# Patient Record
Sex: Female | Born: 1940 | Race: White | Hispanic: No | Marital: Married | Smoking: Never smoker
Health system: Southern US, Community
[De-identification: ages and names within clinical notes are randomized; demographics above are authoritative.]

## PROBLEM LIST (undated history)

## (undated) DIAGNOSIS — K589 Irritable bowel syndrome without diarrhea: Secondary | ICD-10-CM

## (undated) DIAGNOSIS — D649 Anemia, unspecified: Secondary | ICD-10-CM

## (undated) DIAGNOSIS — H409 Unspecified glaucoma: Secondary | ICD-10-CM

## (undated) DIAGNOSIS — M1990 Unspecified osteoarthritis, unspecified site: Secondary | ICD-10-CM

## (undated) DIAGNOSIS — E785 Hyperlipidemia, unspecified: Secondary | ICD-10-CM

## (undated) DIAGNOSIS — R112 Nausea with vomiting, unspecified: Secondary | ICD-10-CM

## (undated) DIAGNOSIS — R51 Headache: Secondary | ICD-10-CM

## (undated) DIAGNOSIS — I219 Acute myocardial infarction, unspecified: Secondary | ICD-10-CM

## (undated) DIAGNOSIS — K219 Gastro-esophageal reflux disease without esophagitis: Secondary | ICD-10-CM

## (undated) DIAGNOSIS — R269 Unspecified abnormalities of gait and mobility: Secondary | ICD-10-CM

## (undated) DIAGNOSIS — Z8701 Personal history of pneumonia (recurrent): Secondary | ICD-10-CM

## (undated) DIAGNOSIS — F419 Anxiety disorder, unspecified: Secondary | ICD-10-CM

## (undated) DIAGNOSIS — F32A Depression, unspecified: Secondary | ICD-10-CM

## (undated) DIAGNOSIS — I2510 Atherosclerotic heart disease of native coronary artery without angina pectoris: Secondary | ICD-10-CM

## (undated) DIAGNOSIS — R42 Dizziness and giddiness: Secondary | ICD-10-CM

## (undated) DIAGNOSIS — D72820 Lymphocytosis (symptomatic): Principal | ICD-10-CM

## (undated) DIAGNOSIS — F329 Major depressive disorder, single episode, unspecified: Secondary | ICD-10-CM

## (undated) DIAGNOSIS — I10 Essential (primary) hypertension: Secondary | ICD-10-CM

## (undated) DIAGNOSIS — Z9889 Other specified postprocedural states: Secondary | ICD-10-CM

## (undated) DIAGNOSIS — E039 Hypothyroidism, unspecified: Secondary | ICD-10-CM

## (undated) DIAGNOSIS — I209 Angina pectoris, unspecified: Secondary | ICD-10-CM

## (undated) DIAGNOSIS — Z8719 Personal history of other diseases of the digestive system: Secondary | ICD-10-CM

## (undated) DIAGNOSIS — R0600 Dyspnea, unspecified: Secondary | ICD-10-CM

## (undated) DIAGNOSIS — Z9181 History of falling: Secondary | ICD-10-CM

## (undated) DIAGNOSIS — E119 Type 2 diabetes mellitus without complications: Secondary | ICD-10-CM

## (undated) HISTORY — DX: Unspecified glaucoma: H40.9

## (undated) HISTORY — DX: Depression, unspecified: F32.A

## (undated) HISTORY — DX: Hyperlipidemia, unspecified: E78.5

## (undated) HISTORY — DX: Lymphocytosis (symptomatic): D72.820

## (undated) HISTORY — DX: Type 2 diabetes mellitus without complications: E11.9

## (undated) HISTORY — DX: Unspecified osteoarthritis, unspecified site: M19.90

## (undated) HISTORY — DX: Major depressive disorder, single episode, unspecified: F32.9

## (undated) HISTORY — PX: KNEE ARTHROSCOPY: SHX127

## (undated) HISTORY — DX: Irritable bowel syndrome, unspecified: K58.9

## (undated) HISTORY — DX: Gastro-esophageal reflux disease without esophagitis: K21.9

## (undated) HISTORY — DX: Hypothyroidism, unspecified: E03.9

## (undated) HISTORY — PX: CARPAL TUNNEL RELEASE: SHX101

---

## 1972-08-04 HISTORY — PX: VAGINAL HYSTERECTOMY: SUR661

## 1983-08-05 DIAGNOSIS — Z8701 Personal history of pneumonia (recurrent): Secondary | ICD-10-CM

## 1983-08-05 HISTORY — DX: Personal history of pneumonia (recurrent): Z87.01

## 1998-04-16 ENCOUNTER — Other Ambulatory Visit: Admission: RE | Admit: 1998-04-16 | Discharge: 1998-04-16 | Payer: Self-pay | Admitting: Gastroenterology

## 1999-03-29 ENCOUNTER — Other Ambulatory Visit: Admission: RE | Admit: 1999-03-29 | Discharge: 1999-03-29 | Payer: Self-pay | Admitting: Internal Medicine

## 2002-10-03 ENCOUNTER — Encounter: Payer: Self-pay | Admitting: Internal Medicine

## 2002-10-03 ENCOUNTER — Encounter: Admission: RE | Admit: 2002-10-03 | Discharge: 2002-10-03 | Payer: Self-pay | Admitting: Internal Medicine

## 2003-05-05 ENCOUNTER — Ambulatory Visit (HOSPITAL_COMMUNITY): Admission: RE | Admit: 2003-05-05 | Discharge: 2003-05-05 | Payer: Self-pay | Admitting: General Surgery

## 2003-05-05 ENCOUNTER — Encounter: Payer: Self-pay | Admitting: General Surgery

## 2003-07-06 ENCOUNTER — Inpatient Hospital Stay (HOSPITAL_COMMUNITY): Admission: RE | Admit: 2003-07-06 | Discharge: 2003-07-07 | Payer: Self-pay | Admitting: Obstetrics and Gynecology

## 2003-08-05 HISTORY — PX: BLADDER SUSPENSION: SHX72

## 2005-02-25 ENCOUNTER — Encounter (INDEPENDENT_AMBULATORY_CARE_PROVIDER_SITE_OTHER): Payer: Self-pay | Admitting: *Deleted

## 2005-02-25 ENCOUNTER — Ambulatory Visit (HOSPITAL_COMMUNITY): Admission: RE | Admit: 2005-02-25 | Discharge: 2005-02-25 | Payer: Self-pay | Admitting: Gastroenterology

## 2005-03-18 ENCOUNTER — Encounter: Admission: RE | Admit: 2005-03-18 | Discharge: 2005-03-18 | Payer: Self-pay | Admitting: Gastroenterology

## 2007-08-05 HISTORY — PX: HAMMER TOE SURGERY: SHX385

## 2007-12-30 ENCOUNTER — Ambulatory Visit: Payer: Self-pay | Admitting: Gastroenterology

## 2008-08-04 HISTORY — PX: THYROIDECTOMY: SHX17

## 2008-09-27 ENCOUNTER — Other Ambulatory Visit: Admission: RE | Admit: 2008-09-27 | Discharge: 2008-09-27 | Payer: Self-pay | Admitting: Interventional Radiology

## 2008-09-27 ENCOUNTER — Encounter (INDEPENDENT_AMBULATORY_CARE_PROVIDER_SITE_OTHER): Payer: Self-pay | Admitting: Interventional Radiology

## 2008-09-27 ENCOUNTER — Encounter: Admission: RE | Admit: 2008-09-27 | Discharge: 2008-09-27 | Payer: Self-pay | Admitting: Internal Medicine

## 2009-04-03 ENCOUNTER — Encounter: Admission: RE | Admit: 2009-04-03 | Discharge: 2009-04-03 | Payer: Self-pay | Admitting: Internal Medicine

## 2009-04-03 ENCOUNTER — Encounter (INDEPENDENT_AMBULATORY_CARE_PROVIDER_SITE_OTHER): Payer: Self-pay | Admitting: Interventional Radiology

## 2009-04-03 ENCOUNTER — Other Ambulatory Visit: Admission: RE | Admit: 2009-04-03 | Discharge: 2009-04-03 | Payer: Self-pay | Admitting: Interventional Radiology

## 2009-04-27 ENCOUNTER — Encounter: Admission: RE | Admit: 2009-04-27 | Discharge: 2009-04-27 | Payer: Self-pay | Admitting: Endocrinology

## 2009-07-05 ENCOUNTER — Encounter (INDEPENDENT_AMBULATORY_CARE_PROVIDER_SITE_OTHER): Payer: Self-pay | Admitting: Surgery

## 2009-07-05 ENCOUNTER — Ambulatory Visit (HOSPITAL_COMMUNITY): Admission: RE | Admit: 2009-07-05 | Discharge: 2009-07-06 | Payer: Self-pay | Admitting: Surgery

## 2010-02-08 ENCOUNTER — Telehealth: Payer: Self-pay | Admitting: Gastroenterology

## 2010-09-03 NOTE — Progress Notes (Signed)
Summary: Switch back to Dr. Jarold Motto  Phone Note From Other Clinic   Caller: [place] @ Dr. Jacky Kindle  [phone] Call For: Dr. Jarold Motto Summary of Call: pt. is a former Dr. Jarold Motto pt. and saw G.I. Dr. Lars Pinks in [place] last year for COL. Dr. Lars Pinks moved to [place], [place] which is too far of a distance to drive and would like to see Dr. Jarold Motto again. Would Dr. Jarold Motto except? Will be sending records. Initial call taken by: Karna Christmas,  February 08, 2010 2:53 PM  Follow-up for Phone Call        NEED CHART... Follow-up by: Mardella Layman MD Sherlyn Lick 3:49 PM  Additional Follow-up for Phone Call Additional follow up Details #1::        Chart ordered.   Lupita Leash Surface RN  February 11, 2010 3:27 PM  Chart to Dr. Jarold Motto for review. Additional Follow-up by: Ashok Cordia RN,  February 18, 2010 11:19 AM     Appended Document: Switch back to Dr. Jarold Motto Dr. Jarold Motto reviewed pt's chart.  Ok to sch appt.  Appended Document: Switch back to Dr. Rebekah Chesterfield pt. to inform her of Dr. Norval Gable decision and to sch'd an appt. Pt. has already sch'd an appt. w/another G.I. Dr. in [place].

## 2010-11-05 LAB — GLUCOSE, CAPILLARY
Glucose-Capillary: 142 mg/dL — ABNORMAL HIGH (ref 70–99)
Glucose-Capillary: 150 mg/dL — ABNORMAL HIGH (ref 70–99)
Glucose-Capillary: 156 mg/dL — ABNORMAL HIGH (ref 70–99)
Glucose-Capillary: 172 mg/dL — ABNORMAL HIGH (ref 70–99)

## 2010-11-05 LAB — CALCIUM: Calcium: 9.4 mg/dL (ref 8.4–10.5)

## 2010-11-06 LAB — DIFFERENTIAL
Eosinophils Relative: 1 % (ref 0–5)
Lymphocytes Relative: 50 % — ABNORMAL HIGH (ref 12–46)
Lymphs Abs: 3.8 10*3/uL (ref 0.7–4.0)
Monocytes Absolute: 0.6 10*3/uL (ref 0.1–1.0)
Neutro Abs: 3 10*3/uL (ref 1.7–7.7)

## 2010-11-06 LAB — URINALYSIS, ROUTINE W REFLEX MICROSCOPIC
Glucose, UA: NEGATIVE mg/dL
Nitrite: NEGATIVE
Protein, ur: NEGATIVE mg/dL

## 2010-11-06 LAB — CBC
MCHC: 35 g/dL (ref 30.0–36.0)
MCV: 91.2 fL (ref 78.0–100.0)
Platelets: 322 10*3/uL (ref 150–400)

## 2010-11-06 LAB — COMPREHENSIVE METABOLIC PANEL
AST: 23 U/L (ref 0–37)
Albumin: 4.8 g/dL (ref 3.5–5.2)
Calcium: 10.1 mg/dL (ref 8.4–10.5)
Creatinine, Ser: 0.81 mg/dL (ref 0.4–1.2)
GFR calc Af Amer: >60 mL/min (ref >60)
GFR calc non Af Amer: >60 mL/min (ref >60)

## 2010-11-06 LAB — PROTIME-INR: Prothrombin Time: 13.3 seconds (ref 11.6–15.2)

## 2010-11-06 LAB — APTT: aPTT: 29 seconds (ref 24–37)

## 2010-12-20 NOTE — H&P (Signed)
NAME:  Angela Barrett, Angela Barrett                             ACCOUNT NO.:  [id number]   MEDICAL RECORD NO.:  [id number]                   PATIENT TYPE:   LOCATION:                                       FACILITY:   PHYSICIAN:  Guy Sandifer. Arleta Creek, M.D.           DATE OF BIRTH:  07/06/2003   DATE OF ADMISSION:  DATE OF DISCHARGE:                                HISTORY & PHYSICAL   CHIEF COMPLAINT:  Cystocele.   HISTORY OF PRESENT ILLNESS:  This patient is a 70 year old married Angela Barrett  female, G4, P4, status post hysterectomy in 1970, status post  anterior/posterior vaginal repair with mid-urethral sling approximately  eight years ago.  Complains of recurrent discomfort.  She can feel the  bladder bulging at the opening of the vagina.  She also has discomfort at  times with intercourse.  She also has difficulty getting her bladder to  empty completely and has two or three trips a night to the bathroom to  urinate.   Reevaluation with Angela Barrett reveals a normal cystometrogram  and a normal flow study.  He does not feel that she requires any incontinent  procedures at present.  Anterior repair with Pelvachol graft as well as  possible posterior repair with Pelvasoft has been discussed with the  patient.  Potential risk of complications were reviewed preoperatively.   PAST MEDICAL HISTORY:  1. Diabetes, followed by Angela Barrett.  2. Irritable bowel syndrome.  3. Diverticulosis.  4. Gastroesophageal reflux disease.  5. Frequent diarrhea secondary to irritable bowel syndrome.  6. Recent evaluation by Angela Barrett. Angela Barrett revealed adequate sphincter     tone.  He felt her rectal leakage was secondary to diarrhea.  7. Bilateral superficial varicosities.  8. 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  9. Arthritis.   PAST SURGICAL HISTORY:  1. Hysterectomy in 1972.  2. A&P repair with midurethral sling eight years ago.   OBSTETRIC HISTORY:  Vaginal delivery x 4.   FAMILY HISTORY:  Bone  cancer in mother.  Emphysema in brother and father.   MEDICATIONS:  1. Glucophage XR 500 mg q.i.d.  2. Glucotrol XL 10 mg q.d.  3. Protonix 10 mg q.d.  4. Glucosamine q.d.  5. Ativan 2 mg q.h.s.  6. Gonadial 1 mg q.d.  7. Lantus insulin 20 units q.h.s.  8. Levbid 0.375 mg 1/2 tablet q.d. p.r.n.   ALLERGIES:  ASPIRIN leading to stomach pain.  MACROBID swelling and itching.  MOTRIN fluid retention and swelling.  AMITRIPTYLINE with disorientation.  CORTISONE with elevated serum glucose.   REVIEW OF SYMPTOMS:  NEUROLOGIC:  Denies recent headache.  PULMONARY:  Denies shortness of breath.  GASTROINTESTINAL:  Diarrhea as above with  recent evaluation by Angela Barrett consistent with diarrhea.  GENITOURINARY:  Recent evaluation by Angela Barrett as above.   PHYSICAL EXAMINATION:  VITAL SIGNS:  Height 5 feet 3-1/2 inches.  Weight 160  pounds.  Blood pressure 142/74.  NECK:  Without thyromegaly.  LUNGS:  Clear to auscultation.  HEART:  Regular rate and rhythm.  BACK:  Without CVA tenderness.  BREASTS:  Without mass, traction, or discharge.  ABDOMEN:  Soft and nontender without masses.  PELVIC:  Vagina without lesions.  Cystocele is noted at the level of  the  vaginal introitus.  There is fair urethrovesical angle support.  No obvious  enterocele or rectocele.  Rectovaginal septum feels intact on rectovaginal  examination.  EXTREMITIES:  Grossly within normal limits.  NEUROLOGICAL:  Grossly within normal limits.   ASSESSMENT:  Recurrent cystocele.   PLAN:  Anterior repair with a Pelvachol graft, possible posterior repair  with Pelvasoft.                                               Guy Sandifer Arleta Creek, M.D.    JET/MEDQ  D:  07/03/2003  T:  07/03/2003  Job:  [id number]

## 2010-12-20 NOTE — Op Note (Signed)
NAME:  Angela Barrett, Angela Barrett                 ACCOUNT NO.:  [id number]   MEDICAL RECORD NO.:  [id number]          PATIENT TYPE:  AMB   LOCATION:  ENDO                         FACILITY:  [facility]   PHYSICIAN:  Petra Kuba, M.D.    DATE OF BIRTH:  1940/12/07   DATE OF PROCEDURE:  02/25/2005  DATE OF DISCHARGE:                                 OPERATIVE REPORT   PROCEDURE PERFORMED:  Esophagogastroduodenoscopy with biopsy, Savary  dilatation.   ENDOSCOPIST:  Petra Kuba, M.D.   INDICATIONS FOR PROCEDURE:  Dysphagia and abdominal pain.   Consent was signed prior to any premeds given, after the risks, benefits,  methods and options were thoroughly discussed in the office.   MEDICINES USED:  Demerol 100 mg, Versed 7 mg.   DESCRIPTION OF PROCEDURE:  The video endoscope was inserted by direct  vision.  The esophagus was normal.  In the distal esophagus was a small  hiatal hernia with a very thin, very widely patent fibrous ring just above  it.  The scope passed easily through this to the stomach and advanced to the  antrum, pertinent for a moderate amount of antritis and advanced through a  widely patent pylorus into a normal duodenal bulb and around the C-loop to a  normal second portion of the duodenum with minimal inflammation.  We went  ahead and took a few biopsies of the second portion of the duodenum and  slowly withdrew back to the bulb which again confirmed its normal  appearance.  The scope withdrawn back to the stomach and retroflexed.  The  angularis, cardia, fundus, lesser and greater curve were all normal except  for some minimal gastritis.  Straight visualization of the stomach did not  reveal any additional findings.  We went ahead and slowly withdrew back to  about 18 cm.  Again confirming the normal esophagus.  Scope was then  advanced back into the stomach.  Both the stomach and duodenum were re-  evaluated completely without additional findings.  We went ahead and took a  few  biopsies of the antrum and a few of the proximal stomach to rule out  Helicobacter pylori.  We then advanced the Savary wire until the customary J-  loop was seen in the antrum and without fluoroscopy guidance.  While  withdrawing the scope, we advanced the wire.  Once the scope was removed,  the wire was at the customary four bar mark which corresponded to 80 cm and  we went ahead and proceeded with a 16 mm Savary dilation over the wire  without fluoroscopy in the customary fashion.  Once the 16 was inserted, the  wire was withdrawn back into the dilator.  Both were removed in tandem.  There was no resistance of heme on the dilator.  The procedure was  terminated at its juncture.  The patient tolerated the procedure well.  There were no obvious immediate complication.   ENDOSCOPIC DIAGNOSES:  1.  Small hiatal hernia with a questionably thin, widely patent fibrous      ring.  2.  Moderate antritis, clinical  gastritis, status post biopsy.  3.  Minimal duodenitis, status post biopsy.  4.  Otherwise normal esophagogastroduodenoscopy.   THERAPY:  Savary dilatation without fluoroscopy, 16 mm only.   PLAN:  See how the dilation works.  If not, continue with the thyroid work-  up per Geoffry Paradise, M.D.  Probably if abdominal pain continues, get an  ultrasound next.  Will check on her when we receive the biopsies.       MEM/MEDQ  D:  02/25/2005  T:  02/25/2005  Job:  [id number]   cc:   Geoffry Paradise, M.D.  [address]  [place]  [place] [address]  Fax: [phone]

## 2010-12-20 NOTE — Op Note (Signed)
NAME:  Angela Barrett, Angela Barrett                           ACCOUNT NO.:  [id number]   MEDICAL RECORD NO.:  [id number]                   PATIENT TYPE:  AMB   LOCATION:  DAY                                  FACILITY:  [facility]   PHYSICIAN:  Guy Sandifer. Arleta Creek, M.D.           DATE OF BIRTH:  [birth year]   DATE OF PROCEDURE:  07/06/2003  DATE OF DISCHARGE:                                 OPERATIVE REPORT   PREOPERATIVE DIAGNOSIS:  Cystocele.   POSTOPERATIVE DIAGNOSIS:  Cystocele.   PROCEDURE:  Anterior repair with Pelvicol graft.   SURGEON:  Guy Sandifer. Henderson Cloud, M.D.   ASSISTANT:  Raynald Kemp, M.D.   ANESTHESIA:  General with endotracheal intubated.   ESTIMATED BLOOD LOSS:  Less than 50 mL.   SPECIMENS:  None.   INDICATIONS FOR PROCEDURE:  The patient is a 70 year old, married [place]  female, G4, P4, status post hysterectomy, status post previous anterior  posterior vaginal repair with a mid urethral sling who has recurrent  symptoms of cystocele. Details are dictated in the history and physical.  Anterior repair and possible posterior repair has been discussed with the  patient. Pelvicol grafts have been discussed. Potential risks and  complications have been reviewed including but not limited to infection,  bowel, bladder, ureteral damage, bleeding requiring transfusion of blood  products with possible transfusion reaction, HIV and hepatitis acquisition,  DVT, PE, pneumonia and fistula formation, postoperative dyspareunia and  recurrent cystocele and/or rectocele. Consent is signed on the chart.   DESCRIPTION OF PROCEDURE:  The patient is taken to the operating room,  placed in dorsal supine position where general anesthesia is induced via  endotracheal intubation. She was then placed in the dorsal lithotomy  position where she is prepped, bladder straight catheterized and she is  draped in a sterile fashion. Examination reveals the cystocele to be  present. However, there is good support  posteriorly. The vaginal cuff was  grasped at each apex with Allis clamps and grasped in the midline. The  mucosa is infiltrated with 0.5% lidocaine with 1:200,000 epinephrine. A T  shaped incision is then made across the vaginal cuff and extending up the  anterior vaginal mucosa in the midline. This is carried up to a point  approximately 3 cm below the level of the urethral meatus. This dissection  is carried out widely, sharply and bluntly freeing up the cystocele.  Dissection is stopped just before the level of the urethrovesical angle. The  patient continues to have good support there status post her mid urethral  sling. The cystocele is reduced first with a pursestring and then  figure-of-  eight sutures of #0 Monocryl. A Pelvicol graft is then trimmed to fit and is  placed under the bladder and anchored in place to the pelvic sidewalls a #0  Monocryl suture and also tacked in the midline with #0 Monocryl suture.  Excess vaginal mucosa is removed. The  apical portion of the mucosa is closed  with a single figure-of-eight #0 Monocryl. The remainder of the mucosa is  closed  with a running locking stitch of 2-0 chromic. Once inch iodoform packing is  placed and a Foley catheter is placed and clear urine is noted. All counts  correct. The patient is awakened and taken to the recovery room in stable  condition.                                               Guy Sandifer Arleta Creek, M.D.    JET/MEDQ  D:  07/06/2003  T:  07/06/2003  Job:  [id number]

## 2011-04-21 ENCOUNTER — Ambulatory Visit: Payer: Self-pay | Admitting: Gastroenterology

## 2011-08-27 DIAGNOSIS — E119 Type 2 diabetes mellitus without complications: Secondary | ICD-10-CM | POA: Diagnosis not present

## 2011-08-27 DIAGNOSIS — E042 Nontoxic multinodular goiter: Secondary | ICD-10-CM | POA: Diagnosis not present

## 2011-08-27 DIAGNOSIS — E890 Postprocedural hypothyroidism: Secondary | ICD-10-CM | POA: Diagnosis not present

## 2011-08-27 DIAGNOSIS — F411 Generalized anxiety disorder: Secondary | ICD-10-CM | POA: Diagnosis not present

## 2011-10-16 DIAGNOSIS — H16229 Keratoconjunctivitis sicca, not specified as Sjogren's, unspecified eye: Secondary | ICD-10-CM | POA: Diagnosis not present

## 2011-10-16 DIAGNOSIS — H16149 Punctate keratitis, unspecified eye: Secondary | ICD-10-CM | POA: Diagnosis not present

## 2011-10-16 DIAGNOSIS — H2510 Age-related nuclear cataract, unspecified eye: Secondary | ICD-10-CM | POA: Diagnosis not present

## 2011-10-16 DIAGNOSIS — S058X9A Other injuries of unspecified eye and orbit, initial encounter: Secondary | ICD-10-CM | POA: Diagnosis not present

## 2011-10-16 DIAGNOSIS — H5710 Ocular pain, unspecified eye: Secondary | ICD-10-CM | POA: Diagnosis not present

## 2011-10-23 DIAGNOSIS — H5789 Other specified disorders of eye and adnexa: Secondary | ICD-10-CM | POA: Diagnosis not present

## 2011-10-23 DIAGNOSIS — H16149 Punctate keratitis, unspecified eye: Secondary | ICD-10-CM | POA: Diagnosis not present

## 2011-10-23 DIAGNOSIS — H40039 Anatomical narrow angle, unspecified eye: Secondary | ICD-10-CM | POA: Diagnosis not present

## 2011-10-23 DIAGNOSIS — H16229 Keratoconjunctivitis sicca, not specified as Sjogren's, unspecified eye: Secondary | ICD-10-CM | POA: Diagnosis not present

## 2011-11-07 DIAGNOSIS — H16229 Keratoconjunctivitis sicca, not specified as Sjogren's, unspecified eye: Secondary | ICD-10-CM | POA: Diagnosis not present

## 2011-11-07 DIAGNOSIS — H16149 Punctate keratitis, unspecified eye: Secondary | ICD-10-CM | POA: Diagnosis not present

## 2012-02-19 DIAGNOSIS — H40039 Anatomical narrow angle, unspecified eye: Secondary | ICD-10-CM | POA: Diagnosis not present

## 2012-02-19 DIAGNOSIS — H04129 Dry eye syndrome of unspecified lacrimal gland: Secondary | ICD-10-CM | POA: Diagnosis not present

## 2012-02-25 DIAGNOSIS — E890 Postprocedural hypothyroidism: Secondary | ICD-10-CM | POA: Diagnosis not present

## 2012-02-25 DIAGNOSIS — E119 Type 2 diabetes mellitus without complications: Secondary | ICD-10-CM | POA: Diagnosis not present

## 2012-02-25 DIAGNOSIS — R82998 Other abnormal findings in urine: Secondary | ICD-10-CM | POA: Diagnosis not present

## 2012-03-03 DIAGNOSIS — E119 Type 2 diabetes mellitus without complications: Secondary | ICD-10-CM | POA: Diagnosis not present

## 2012-03-03 DIAGNOSIS — E890 Postprocedural hypothyroidism: Secondary | ICD-10-CM | POA: Diagnosis not present

## 2012-03-03 DIAGNOSIS — F329 Major depressive disorder, single episode, unspecified: Secondary | ICD-10-CM | POA: Diagnosis not present

## 2012-03-03 DIAGNOSIS — Z79899 Other long term (current) drug therapy: Secondary | ICD-10-CM | POA: Diagnosis not present

## 2012-03-03 DIAGNOSIS — Z0000 Encounter for general adult medical examination without abnormal findings: Secondary | ICD-10-CM | POA: Diagnosis not present

## 2012-03-04 DIAGNOSIS — Z1212 Encounter for screening for malignant neoplasm of rectum: Secondary | ICD-10-CM | POA: Diagnosis not present

## 2012-03-23 ENCOUNTER — Telehealth: Payer: Self-pay | Admitting: Oncology

## 2012-03-23 NOTE — Telephone Encounter (Signed)
Referring Dr. Geoffry Paradise Dx- ? [facility] NP packet mailed out.  Chart delivered.

## 2012-03-23 NOTE — Telephone Encounter (Signed)
S/W pt re appt 8/22 @ 3 w/Dr. Gaylyn Rong.

## 2012-03-24 ENCOUNTER — Encounter: Payer: Self-pay | Admitting: Oncology

## 2012-03-24 DIAGNOSIS — D72820 Lymphocytosis (symptomatic): Secondary | ICD-10-CM

## 2012-03-24 HISTORY — DX: Lymphocytosis (symptomatic): D72.820

## 2012-03-25 ENCOUNTER — Ambulatory Visit: Payer: Self-pay | Admitting: Oncology

## 2012-03-25 ENCOUNTER — Other Ambulatory Visit: Payer: Self-pay | Admitting: Lab

## 2012-03-25 ENCOUNTER — Ambulatory Visit: Payer: Self-pay

## 2012-03-25 DIAGNOSIS — E890 Postprocedural hypothyroidism: Secondary | ICD-10-CM | POA: Diagnosis not present

## 2012-04-11 NOTE — Progress Notes (Signed)
No show.  Reschdule prn.

## 2012-04-13 ENCOUNTER — Telehealth: Payer: Self-pay | Admitting: Oncology

## 2012-04-13 NOTE — Telephone Encounter (Signed)
Per pt she stated that Dr. Jacky Kindle will f/u with pt.

## 2012-04-22 DIAGNOSIS — Z1231 Encounter for screening mammogram for malignant neoplasm of breast: Secondary | ICD-10-CM | POA: Diagnosis not present

## 2012-05-20 DIAGNOSIS — H179 Unspecified corneal scar and opacity: Secondary | ICD-10-CM | POA: Diagnosis not present

## 2012-05-20 DIAGNOSIS — H40039 Anatomical narrow angle, unspecified eye: Secondary | ICD-10-CM | POA: Diagnosis not present

## 2012-05-20 DIAGNOSIS — E119 Type 2 diabetes mellitus without complications: Secondary | ICD-10-CM | POA: Diagnosis not present

## 2012-05-20 DIAGNOSIS — H2510 Age-related nuclear cataract, unspecified eye: Secondary | ICD-10-CM | POA: Diagnosis not present

## 2012-05-20 DIAGNOSIS — E11319 Type 2 diabetes mellitus with unspecified diabetic retinopathy without macular edema: Secondary | ICD-10-CM | POA: Diagnosis not present

## 2012-05-20 DIAGNOSIS — H18459 Nodular corneal degeneration, unspecified eye: Secondary | ICD-10-CM | POA: Diagnosis not present

## 2012-09-08 DIAGNOSIS — E890 Postprocedural hypothyroidism: Secondary | ICD-10-CM | POA: Diagnosis not present

## 2012-09-08 DIAGNOSIS — E119 Type 2 diabetes mellitus without complications: Secondary | ICD-10-CM | POA: Diagnosis not present

## 2012-09-08 DIAGNOSIS — Z1331 Encounter for screening for depression: Secondary | ICD-10-CM | POA: Diagnosis not present

## 2012-09-08 DIAGNOSIS — K219 Gastro-esophageal reflux disease without esophagitis: Secondary | ICD-10-CM | POA: Diagnosis not present

## 2012-10-15 DIAGNOSIS — N8111 Cystocele, midline: Secondary | ICD-10-CM | POA: Diagnosis not present

## 2012-10-15 DIAGNOSIS — N949 Unspecified condition associated with female genital organs and menstrual cycle: Secondary | ICD-10-CM | POA: Diagnosis not present

## 2013-01-02 HISTORY — PX: CARDIAC CATHETERIZATION: SHX172

## 2013-01-05 ENCOUNTER — Emergency Department (HOSPITAL_COMMUNITY): Payer: Medicare Other

## 2013-01-05 ENCOUNTER — Encounter (HOSPITAL_COMMUNITY)
Admission: EM | Disposition: A | Discharge status: Home Health | Payer: Self-pay | Source: Home / Self Care | Attending: Cardiovascular Disease

## 2013-01-05 ENCOUNTER — Encounter (HOSPITAL_COMMUNITY): Payer: Self-pay | Admitting: Emergency Medicine

## 2013-01-05 ENCOUNTER — Inpatient Hospital Stay (HOSPITAL_COMMUNITY)
Admission: EM | Admit: 2013-01-05 | Discharge: 2013-01-12 | DRG: 234 | Disposition: A | Discharge status: Home Health | Payer: Medicare Other | Attending: Cardiovascular Disease | Admitting: Cardiovascular Disease

## 2013-01-05 DIAGNOSIS — Z888 Allergy status to other drugs, medicaments and biological substances status: Secondary | ICD-10-CM | POA: Diagnosis not present

## 2013-01-05 DIAGNOSIS — I214 Non-ST elevation (NSTEMI) myocardial infarction: Secondary | ICD-10-CM

## 2013-01-05 DIAGNOSIS — Z833 Family history of diabetes mellitus: Secondary | ICD-10-CM

## 2013-01-05 DIAGNOSIS — I200 Unstable angina: Secondary | ICD-10-CM | POA: Diagnosis not present

## 2013-01-05 DIAGNOSIS — E119 Type 2 diabetes mellitus without complications: Secondary | ICD-10-CM

## 2013-01-05 DIAGNOSIS — R110 Nausea: Secondary | ICD-10-CM | POA: Diagnosis not present

## 2013-01-05 DIAGNOSIS — Z452 Encounter for adjustment and management of vascular access device: Secondary | ICD-10-CM | POA: Diagnosis not present

## 2013-01-05 DIAGNOSIS — Z836 Family history of other diseases of the respiratory system: Secondary | ICD-10-CM

## 2013-01-05 DIAGNOSIS — Z808 Family history of malignant neoplasm of other organs or systems: Secondary | ICD-10-CM | POA: Diagnosis not present

## 2013-01-05 DIAGNOSIS — E039 Hypothyroidism, unspecified: Secondary | ICD-10-CM | POA: Diagnosis present

## 2013-01-05 DIAGNOSIS — K589 Irritable bowel syndrome without diarrhea: Secondary | ICD-10-CM | POA: Diagnosis present

## 2013-01-05 DIAGNOSIS — D72820 Lymphocytosis (symptomatic): Secondary | ICD-10-CM | POA: Diagnosis not present

## 2013-01-05 DIAGNOSIS — K219 Gastro-esophageal reflux disease without esophagitis: Secondary | ICD-10-CM | POA: Diagnosis present

## 2013-01-05 DIAGNOSIS — M1990 Unspecified osteoarthritis, unspecified site: Secondary | ICD-10-CM | POA: Diagnosis present

## 2013-01-05 DIAGNOSIS — F329 Major depressive disorder, single episode, unspecified: Secondary | ICD-10-CM | POA: Diagnosis present

## 2013-01-05 DIAGNOSIS — E785 Hyperlipidemia, unspecified: Secondary | ICD-10-CM | POA: Diagnosis present

## 2013-01-05 DIAGNOSIS — R079 Chest pain, unspecified: Secondary | ICD-10-CM | POA: Diagnosis not present

## 2013-01-05 DIAGNOSIS — Z01810 Encounter for preprocedural cardiovascular examination: Secondary | ICD-10-CM | POA: Diagnosis not present

## 2013-01-05 DIAGNOSIS — J9383 Other pneumothorax: Secondary | ICD-10-CM | POA: Diagnosis not present

## 2013-01-05 DIAGNOSIS — E1169 Type 2 diabetes mellitus with other specified complication: Secondary | ICD-10-CM | POA: Diagnosis not present

## 2013-01-05 DIAGNOSIS — F3289 Other specified depressive episodes: Secondary | ICD-10-CM | POA: Diagnosis present

## 2013-01-05 DIAGNOSIS — Z794 Long term (current) use of insulin: Secondary | ICD-10-CM

## 2013-01-05 DIAGNOSIS — Z7982 Long term (current) use of aspirin: Secondary | ICD-10-CM | POA: Diagnosis not present

## 2013-01-05 DIAGNOSIS — I249 Acute ischemic heart disease, unspecified: Secondary | ICD-10-CM

## 2013-01-05 DIAGNOSIS — E8779 Other fluid overload: Secondary | ICD-10-CM | POA: Diagnosis not present

## 2013-01-05 DIAGNOSIS — I2510 Atherosclerotic heart disease of native coronary artery without angina pectoris: Secondary | ICD-10-CM | POA: Diagnosis present

## 2013-01-05 DIAGNOSIS — J90 Pleural effusion, not elsewhere classified: Secondary | ICD-10-CM | POA: Diagnosis not present

## 2013-01-05 DIAGNOSIS — R197 Diarrhea, unspecified: Secondary | ICD-10-CM | POA: Diagnosis not present

## 2013-01-05 DIAGNOSIS — J811 Chronic pulmonary edema: Secondary | ICD-10-CM | POA: Diagnosis not present

## 2013-01-05 DIAGNOSIS — IMO0001 Reserved for inherently not codable concepts without codable children: Secondary | ICD-10-CM | POA: Diagnosis not present

## 2013-01-05 DIAGNOSIS — Z79899 Other long term (current) drug therapy: Secondary | ICD-10-CM

## 2013-01-05 DIAGNOSIS — J9819 Other pulmonary collapse: Secondary | ICD-10-CM | POA: Diagnosis not present

## 2013-01-05 DIAGNOSIS — Z951 Presence of aortocoronary bypass graft: Secondary | ICD-10-CM

## 2013-01-05 HISTORY — PX: LEFT HEART CATHETERIZATION WITH CORONARY ANGIOGRAM: SHX5451

## 2013-01-05 HISTORY — DX: Other specified postprocedural states: Z98.890

## 2013-01-05 HISTORY — DX: Other specified postprocedural states: R11.2

## 2013-01-05 LAB — GLUCOSE, CAPILLARY
Glucose-Capillary: 82 mg/dL (ref 70–99)
Glucose-Capillary: 103 mg/dL — ABNORMAL HIGH (ref 70–99)

## 2013-01-05 LAB — BASIC METABOLIC PANEL
GFR calc Af Amer: 80 mL/min — ABNORMAL LOW (ref >90)
GFR calc non Af Amer: 69 mL/min — ABNORMAL LOW (ref >90)
Glucose, Bld: 83 mg/dL (ref 70–99)
Potassium: 3.9 mEq/L (ref 3.5–5.1)
Sodium: 141 mEq/L (ref 135–145)

## 2013-01-05 LAB — PRO B NATRIURETIC PEPTIDE: Pro B Natriuretic peptide (BNP): 986.9 pg/mL — ABNORMAL HIGH (ref 0–125)

## 2013-01-05 LAB — CBC
Hemoglobin: 13.6 g/dL (ref 12.0–15.0)
MCHC: 35.6 g/dL (ref 30.0–36.0)
Platelets: 310 10*3/uL (ref 150–400)

## 2013-01-05 LAB — POCT I-STAT TROPONIN I: Troponin i, poc: 0.16 ng/mL (ref 0.00–0.08)

## 2013-01-05 LAB — PROTIME-INR: Prothrombin Time: 12.5 seconds (ref 11.6–15.2)

## 2013-01-05 SURGERY — LEFT HEART CATHETERIZATION WITH CORONARY ANGIOGRAM
Anesthesia: LOCAL

## 2013-01-05 MED ORDER — HEPARIN (PORCINE) IN NACL 2-0.9 UNIT/ML-% IJ SOLN
INTRAMUSCULAR | Status: AC
Start: 2013-01-05 — End: 2013-01-05
  Filled 2013-01-05: qty 2000

## 2013-01-05 MED ORDER — ONDANSETRON HCL 4 MG/2ML IJ SOLN: 4.0000 mg | Freq: Four times a day (QID) | INTRAMUSCULAR | Status: DC | PRN

## 2013-01-05 MED ORDER — LIDOCAINE HCL (PF) 1 % IJ SOLN
INTRAMUSCULAR | Status: AC
  Filled 2013-01-05: qty 30

## 2013-01-05 MED ORDER — SODIUM CHLORIDE 0.9 % IV SOLN: 250.0000 mL | INTRAVENOUS | Status: DC | PRN

## 2013-01-05 MED ORDER — METOPROLOL TARTRATE 12.5 MG HALF TABLET
12.5000 mg | ORAL_TABLET | Freq: Two times a day (BID) | ORAL | Status: DC
  Administered 2013-01-05 – 2013-01-06 (×3): 12.5 mg via ORAL
  Filled 2013-01-05 (×5): qty 1

## 2013-01-05 MED ORDER — VERAPAMIL HCL 2.5 MG/ML IV SOLN
INTRAVENOUS | Status: AC
  Filled 2013-01-05: qty 2

## 2013-01-05 MED ORDER — HEPARIN (PORCINE) IN NACL 100-0.45 UNIT/ML-% IJ SOLN
700.0000 [IU]/h | INTRAMUSCULAR | Status: DC
  Administered 2013-01-05: 700 [IU]/h via INTRAVENOUS
  Filled 2013-01-05 (×2): qty 250

## 2013-01-05 MED ORDER — SODIUM CHLORIDE 0.9 % IV SOLN
INTRAVENOUS | Status: AC
  Administered 2013-01-05: 18:00:00 via INTRAVENOUS

## 2013-01-05 MED ORDER — EXENATIDE 10 MCG/0.04ML ~~LOC~~ SOPN
10.0000 ug | PEN_INJECTOR | Freq: Every day | SUBCUTANEOUS | Status: DC
  Filled 2013-01-05: qty 2.4

## 2013-01-05 MED ORDER — SODIUM CHLORIDE 0.9 % IJ SOLN: 3.0000 mL | Freq: Two times a day (BID) | INTRAMUSCULAR | Status: DC

## 2013-01-05 MED ORDER — MIDAZOLAM HCL 2 MG/2ML IJ SOLN
INTRAMUSCULAR | Status: AC
  Filled 2013-01-05: qty 2

## 2013-01-05 MED ORDER — ASPIRIN EC 81 MG PO TBEC
81.0000 mg | DELAYED_RELEASE_TABLET | Freq: Every day | ORAL | Status: DC
  Administered 2013-01-06: 81 mg via ORAL
  Filled 2013-01-05 (×2): qty 1

## 2013-01-05 MED ORDER — INSULIN ASPART 100 UNIT/ML ~~LOC~~ SOLN
0.0000 [IU] | Freq: Three times a day (TID) | SUBCUTANEOUS | Status: DC
  Administered 2013-01-06: 3 [IU] via SUBCUTANEOUS

## 2013-01-05 MED ORDER — INSULIN GLARGINE 100 UNIT/ML ~~LOC~~ SOLN
35.0000 [IU] | Freq: Every day | SUBCUTANEOUS | Status: DC
  Filled 2013-01-05 (×2): qty 0.35

## 2013-01-05 MED ORDER — HEPARIN BOLUS VIA INFUSION
3500.0000 [IU] | Freq: Once | INTRAVENOUS | Status: AC
  Administered 2013-01-05: 3500 [IU] via INTRAVENOUS

## 2013-01-05 MED ORDER — ATORVASTATIN CALCIUM 80 MG PO TABS
80.0000 mg | ORAL_TABLET | Freq: Every day | ORAL | Status: DC
  Administered 2013-01-05 – 2013-01-06 (×2): 80 mg via ORAL
  Filled 2013-01-05 (×3): qty 1

## 2013-01-05 MED ORDER — NITROGLYCERIN IN D5W 200-5 MCG/ML-% IV SOLN
2.0000 ug/min | Freq: Once | INTRAVENOUS | Status: AC
  Administered 2013-01-05: 5 ug/min via INTRAVENOUS
  Filled 2013-01-05: qty 250

## 2013-01-05 MED ORDER — ASPIRIN 325 MG PO TABS
325.0000 mg | ORAL_TABLET | Freq: Every day | ORAL | Status: DC
  Administered 2013-01-05: 325 mg via ORAL
  Filled 2013-01-05: qty 1

## 2013-01-05 MED ORDER — ACETAMINOPHEN 325 MG PO TABS
650.0000 mg | ORAL_TABLET | ORAL | Status: DC | PRN
  Administered 2013-01-05: 650 mg via ORAL
  Filled 2013-01-05: qty 2

## 2013-01-05 MED ORDER — SODIUM CHLORIDE 0.9 % IV SOLN
1.0000 mL/kg/h | INTRAVENOUS | Status: DC
  Administered 2013-01-05: 1 mL/kg/h via INTRAVENOUS

## 2013-01-05 MED ORDER — PANTOPRAZOLE SODIUM 40 MG PO TBEC
40.0000 mg | DELAYED_RELEASE_TABLET | Freq: Every day | ORAL | Status: DC
  Administered 2013-01-05 – 2013-01-06 (×2): 40 mg via ORAL
  Filled 2013-01-05 (×2): qty 1

## 2013-01-05 MED ORDER — KETOTIFEN FUMARATE 0.025 % OP SOLN: 1.0000 [drp] | OPHTHALMIC | Status: DC | PRN

## 2013-01-05 MED ORDER — REFRESH P.M. OP OINT: 1.0000 "application " | TOPICAL_OINTMENT | Freq: Every day | OPHTHALMIC | Status: DC

## 2013-01-05 MED ORDER — NITROGLYCERIN 0.4 MG SL SUBL
0.4000 mg | SUBLINGUAL_TABLET | SUBLINGUAL | Status: DC | PRN
  Administered 2013-01-06: 0.4 mg via SUBLINGUAL
  Filled 2013-01-05: qty 50

## 2013-01-05 MED ORDER — ARTIFICIAL TEARS OP OINT
TOPICAL_OINTMENT | Freq: Every day | OPHTHALMIC | Status: DC
  Administered 2013-01-05: 1 via OPHTHALMIC
  Administered 2013-01-06 – 2013-01-11 (×6): via OPHTHALMIC
  Filled 2013-01-05 (×2): qty 3.5

## 2013-01-05 MED ORDER — HEPARIN (PORCINE) IN NACL 100-0.45 UNIT/ML-% IJ SOLN
950.0000 [IU]/h | INTRAMUSCULAR | Status: DC
  Administered 2013-01-06: 700 [IU]/h via INTRAVENOUS
  Filled 2013-01-05 (×3): qty 250

## 2013-01-05 MED ORDER — SODIUM CHLORIDE 0.9 % IJ SOLN: 3.0000 mL | INTRAMUSCULAR | Status: DC | PRN

## 2013-01-05 MED ORDER — HEPARIN SODIUM (PORCINE) 1000 UNIT/ML IJ SOLN
INTRAMUSCULAR | Status: AC
  Filled 2013-01-05: qty 1

## 2013-01-05 MED ORDER — LORAZEPAM 1 MG PO TABS
2.5000 mg | ORAL_TABLET | Freq: Every day | ORAL | Status: DC
  Administered 2013-01-05 – 2013-01-06 (×2): 2.5 mg via ORAL
  Filled 2013-01-05 (×2): qty 2

## 2013-01-05 MED ORDER — NITROGLYCERIN IN D5W 200-5 MCG/ML-% IV SOLN
2.0000 ug/min | INTRAVENOUS | Status: DC
  Administered 2013-01-06: 50 ug/min via INTRAVENOUS
  Filled 2013-01-05 (×2): qty 250

## 2013-01-05 MED ORDER — FENTANYL CITRATE 0.05 MG/ML IJ SOLN
INTRAMUSCULAR | Status: AC
  Filled 2013-01-05: qty 2

## 2013-01-05 MED ORDER — NITROGLYCERIN 0.4 MG SL SUBL
0.4000 mg | SUBLINGUAL_TABLET | SUBLINGUAL | Status: DC | PRN
  Administered 2013-01-05 (×2): 0.4 mg via SUBLINGUAL
  Filled 2013-01-05: qty 25

## 2013-01-05 MED ORDER — LEVOTHYROXINE SODIUM 88 MCG PO TABS
88.0000 ug | ORAL_TABLET | Freq: Every day | ORAL | Status: DC
  Administered 2013-01-06: 88 ug via ORAL
  Filled 2013-01-05 (×3): qty 1

## 2013-01-05 NOTE — ED Provider Notes (Signed)
Date: 01/05/2013  Rate: 86  Rhythm: normal sinus rhythm  QRS Axis: normal  Intervals: normal  ST/T Wave abnormalities: normal  Conduction Disutrbances: none  Narrative Interpretation:   Old EKG Reviewed: No significant changes noted     Lyanne Co, MD 01/05/13 1151

## 2013-01-05 NOTE — CV Procedure (Addendum)
   Cardiac Catheterization Procedure Note  Name: Angela Barrett MRN: [id number] DOB: [birth year]  Procedure: Left Heart Cath, Selective Coronary Angiography, LV angiography  Indication: non-ST elevation myocardial infarction  Medications:  Sedation:  1 mg IV Versed, 25 mcg IV Fentanyl  Contrast:  55 ml Omnipaque   Procedural Details: The right wrist was prepped, draped, and anesthetized with 1% lidocaine. Using the modified Seldinger technique, a 5 French sheath was introduced into the right radial artery. 3 mg of verapamil was administered through the sheath, weight-based unfractionated heparin was administered intravenously. A Jackie catheter was used for selective coronary angiography. A JR 4 catheter was ultimately used in case of coronary artery. A pigtail catheter was used for left ventriculography. Catheter exchanges were performed over an exchange length guidewire. There were no immediate procedural complications. A TR band was used for radial hemostasis at the completion of the procedure.  The patient was transferred to the post catheterization recovery area for further monitoring.  Procedural Findings:  Hemodynamics: AO:  118/52   mmHg LV:  121/5    mmHg LVEDP: 11  mmHg  Coronary angiography: Coronary dominance: right   Left Main:  Normal in size with 20% distal disease.  Left anterior descending artery: Normal in size and heavily calcified in the proximal segment. There is a 60% proximal stenosis. In the midsegment, there is diffuse 50-60% disease and significant remodeling and the vessel. There is a 70-80% lesion distally.   1st diagonal (D1):  Normal in size with 90% proximal disease.  2nd diagonal (D2):  Very small in size.  3rd diagonal (D3):  Very small in size.  Circumflex (LCx):  Normal in size and nondominant. The vessel is mildly calcified. There is 95% diffuse disease in the midsegment. This is likely the culprit for NSTEMI.   1st obtuse marginal:  Small in  size with minor irregularities.  2nd obtuse marginal:  Normal in size with 20% diffuse disease proximally.  3rd obtuse marginal:  Normal in size with minor irregularities.   Right Coronary Artery: Normal in size and dominant. There is diffuse 40% disease proximally. There is an 80% discrete lesion in the midsegment. The rest of the vessel has minor irregularities.  Posterior descending artery: Medium in size with no significant disease.  Posterior AV segment: Normal in size with diffuse 20% disease.  Posterolateral branchs: 40% disease proximally.  Left ventriculography: Left ventricular systolic function is low normal , LVEF is estimated at 50 %, there is no significant mitral regurgitation   Final Conclusions:   1. Significant three-vessel coronary artery disease. 2. Low normal LV systolic function with an ejection fraction of 50%. Normal left ventricular end-diastolic pressure.  Recommendations:  I consulted CVTS for CABG. Although the disease in the left circumflex and right coronary artery is approachable by PCI, the patient has calcified and diffusely diseased diabetic vessels. Thus, CABG is probably a better option. She has reasonable targets in diagonal, LAD, OM 3 and right PL Resume Heparin 8 hours post sheath pull.   Lorine Bears MD, [facility] 01/05/2013, 5:42 PM

## 2013-01-05 NOTE — ED Provider Notes (Signed)
History     CSN: [id number]  Arrival date & time 01/05/13  1118   First MD Initiated Contact with Patient 01/05/13 1137      Chief Complaint  Patient presents with  . Chest Pain    (Consider location/radiation/quality/duration/timing/severity/associated sxs/prior treatment) HPI Comments: Pt with PMH significant for depression, hypothyroidism, GERD, DM2, IBS, OA, lymphocytosis, presents to the ED for chest pain.  Pain described as sharp, mid-sternal pain radiating into both extremities causing a heaviness in her arms bilaterally.  Pain exacerbated with exertion and accompanied by SOB, nausea, and lightheadedness. Pain relieved with lying still.  Pt has hx of GERD but states this is different from that pain.  Pt has evaluated by cardiology approx 8 years ago, Dr. [place], with negative work-up including cath and stress tests which pt states were negative at that time.  Pt has positive family hx of CAD and MI- grandfather.  Denies any abdominal pain, diarrhea, vomiting, dizziness, weakness, or AMS.  No LE edema or calf pain.  No recent travel or surgeries. PCP- Geoffry Paradise  The history is provided by the patient.    Past Medical History  Diagnosis Date  . Depression   . Hypothyroidism   . GERD (gastroesophageal reflux disease)   . Diabetes mellitus, type 2   . IBS (irritable bowel syndrome)   . OA (osteoarthritis)   . Lymphocytosis 03/24/2012    Past Surgical History  Procedure Laterality Date  . Thyroidectomy  2010    No family history on file.  History  Substance Use Topics  . Smoking status: Never Smoker   . Smokeless tobacco: Never Used  . Alcohol Use: No    OB History   Grav Para Term Preterm Abortions TAB SAB Ect Mult Living                  Review of Systems  Respiratory: Positive for shortness of breath.   Cardiovascular: Positive for chest pain.  All other systems reviewed and are negative.    Allergies  Review of patient's allergies indicates not on  file.  Home Medications  No current outpatient prescriptions on file.  BP 141/74  Pulse 90  Temp(Src) 98.6 F (37 C) (Oral)  Resp 21  SpO2 96%  Physical Exam  Nursing note and vitals reviewed. Constitutional: She is oriented to person, place, and time. She appears well-developed and well-nourished.  HENT:  Head: Normocephalic and atraumatic.  Mouth/Throat: Uvula is midline, oropharynx is clear and moist and mucous membranes are normal.  Eyes: Conjunctivae and EOM are normal. Pupils are equal, round, and reactive to light.  Neck: Normal range of motion. Neck supple.  Cardiovascular: Normal rate, regular rhythm and normal heart sounds.   Pulmonary/Chest: Effort normal and breath sounds normal.    Chest pain not reproducible with palpation to chest wall  Abdominal: Soft. Bowel sounds are normal. There is no tenderness. There is no guarding.  Musculoskeletal: Normal range of motion.  Neurological: She is alert and oriented to person, place, and time.  Skin: Skin is warm and dry.  Psychiatric: She has a normal mood and affect.    ED Course  Procedures (including critical care time)   Date: 01/05/2013  Rate: 86  Rhythm: normal sinus rhythm  QRS Axis: normal  Intervals: normal  ST/T Wave abnormalities: normal  Conduction Disutrbances:left anterior fascicular block  Narrative Interpretation: NSR, no STEMI  Old EKG Reviewed: changes noted    Labs Reviewed  BASIC METABOLIC PANEL - Abnormal; Notable  for the following:    GFR calc non Af Amer 69 (*)    GFR calc Af Amer 80 (*)    All other components within normal limits  PRO B NATRIURETIC PEPTIDE - Abnormal; Notable for the following:    Pro B Natriuretic peptide (BNP) 986.9 (*)    All other components within normal limits  POCT I-STAT TROPONIN I - Abnormal; Notable for the following:    Troponin i, poc 0.16 (*)    All other components within normal limits  GLUCOSE, CAPILLARY  CBC  APTT  PROTIME-INR  TROPONIN I    Dg Chest 2 View  01/05/2013   *RADIOLOGY REPORT*  Clinical Data: Chest pain.  CHEST - 2 VIEW  Comparison: 07/05/2009.  Findings: The cardiac silhouette, mediastinal and hilar contours are normal.  The lungs are clear.  No pleural effusion.  The bony thorax is intact.  IMPRESSION: No acute cardiopulmonary findings.   Original Report Authenticated By: Rudie Meyer, M.D.     1. Acute coronary syndrome       MDM   EKG NSR, no acute ischemic changes.  Trop elevated at 0.16  Chest pain relieved with SL nitro.  Heparin bolus and nitro drip initiated in the ED.  Cardiology was consulted, they will see her.  Pt will be transferred to [facility] hospital.  VS stable for transfer.      Garlon Hatchet, PA-C 01/05/13 1336

## 2013-01-05 NOTE — ED Notes (Signed)
Dr. Patria Mane was notified of pt's elevated troponin.

## 2013-01-05 NOTE — Progress Notes (Signed)
ANTICOAGULATION CONSULT NOTE - Follow Up  Pharmacy Consult for heparin Indication: chest pain/ACS  Allergies  Allergen Reactions  . Amitriptyline Other (See Comments)    disoriented  . Cortisone Other (See Comments)    Eyes hurt and sugar goes up  . Macrodantin (Nitrofurantoin) Itching and Swelling  . Motrin (Ibuprofen) Other (See Comments)    Fluid retention    Patient Measurements: Weight: 130 lb (58.968 kg) Heparin Dosing Weight: ~58  Vital Signs: Temp: 98.7 F (37.1 C) (06/04 1600) Temp src: Oral (06/04 1600) BP: 126/70 mmHg (06/04 1900) Pulse Rate: 71 (06/04 1900)  Labs:  Recent Labs  01/05/13 1135 01/05/13 1230 01/05/13 1535  HGB 13.6  --   --   HCT 38.2  --   --   PLT 310  --   --   APTT  --  29  --   LABPROT  --  12.5  --   INR  --  0.94  --   CREATININE 0.83  --   --   TROPONINI  --   --  <0.30    CrCl is unknown because there is no height on file for the current visit.   Medical History: Past Medical History  Diagnosis Date  . Depression   . Hypothyroidism   . GERD (gastroesophageal reflux disease)   . Diabetes mellitus, type 2   . IBS (irritable bowel syndrome)   . OA (osteoarthritis)   . Lymphocytosis 03/24/2012  . PONV (postoperative nausea and vomiting)   . Chest pain     a. nl myoview ~ 2006.    Medications:  Prescriptions prior to admission  Medication Sig Dispense Refill  . Artificial Tear Ointment (ARTIFICIAL TEARS) ointment Place 1 application into both eyes at bedtime.      Marland Kitchen aspirin EC 81 MG tablet Take 81 mg by mouth 2 (two) times daily.      Marland Kitchen exenatide (BYETTA) 10 MCG/0.04ML SOPN Inject 10 mcg into the skin daily.      Marland Kitchen glucosamine-chondroitin 500-400 MG tablet Take 1 tablet by mouth every morning.      . insulin glargine (LANTUS) 100 UNIT/ML injection Inject 35 Units into the skin at bedtime.      Marland Kitchen ketotifen (THERA TEARS ALLERGY) 0.025 % ophthalmic solution Place 1 drop into both eyes as needed (for allergy eyes.).       Marland Kitchen levothyroxine (SYNTHROID, LEVOTHROID) 88 MCG tablet Take 88 mcg by mouth daily before breakfast.      . LORazepam (ATIVAN) 1 MG tablet Take 2.5 mg by mouth at bedtime.      . metFORMIN (GLUCOPHAGE-XR) 500 MG 24 hr tablet Take 500 mg by mouth daily with breakfast.      . pantoprazole (PROTONIX) 40 MG tablet Take 40 mg by mouth daily at 2 PM daily at 2 PM.       Scheduled:  . artificial tears   Both Eyes QHS  . [START ON 01/06/2013] aspirin EC  81 mg Oral Daily  . atorvastatin  80 mg Oral q1800  . [START ON 01/06/2013] exenatide  10 mcg Subcutaneous Daily  . insulin aspart  0-15 Units Subcutaneous TID WC  . insulin glargine  35 Units Subcutaneous QHS  . [START ON 01/06/2013] levothyroxine  88 mcg Oral QAC breakfast  . LORazepam  2.5 mg Oral QHS  . metoprolol tartrate  12.5 mg Oral BID  . pantoprazole  40 mg Oral Q1400  . sodium chloride  3 mL Intravenous Q12H  .  sodium chloride  3 mL Intravenous Q12H   Infusions:  . sodium chloride 1 mL/kg/hr (01/05/13 1515)  . sodium chloride 75 mL/hr at 01/05/13 1806    Assessment:  72yo F with chest pain, SOB, nausea, and lightheadedness. Starting heparin for suspected ACS > Cath, plan for evaluation by CVTS.  CBC and baseline coags WNL.  TR Band post cath, currently in place, with small am mount of air remaining, to remove within the hour.  Goal of Therapy:  Heparin level 0.3-0.7 units/ml Monitor platelets by anticoagulation protocol: Yes   Plan:   Resume heparin at 700 units/h at 4 am  F/u heparin level in ~6hrs then daily.  Daily CBC   Thank you for allowing pharmacy to be a part of this patients care team.  Lovenia Kim Pharm.D., BCPS Clinical Pharmacist 01/05/2013 7:23 PM Pager: [phone] Phone: [phone]

## 2013-01-05 NOTE — Progress Notes (Signed)
ANTICOAGULATION CONSULT NOTE - Initial Consult  Pharmacy Consult for heparin Indication: chest pain/ACS  Allergies  Allergen Reactions  . Amitriptyline Other (See Comments)    disoriented  . Aspirin Other (See Comments)    Upset stomach (can tolerate)  . Cortisone Other (See Comments)    Eyes hurt and sugar goes up  . Macrodantin (Nitrofurantoin) Itching and Swelling  . Motrin (Ibuprofen) Other (See Comments)    Fluid retention    Patient Measurements: Weight: 130 lb (58.968 kg) Heparin Dosing Weight: ~58  Vital Signs: Temp: 98.6 F (37 C) (06/04 1128) Temp src: Oral (06/04 1128) BP: 135/71 mmHg (06/04 1250) Pulse Rate: 76 (06/04 1250)  Labs:  Recent Labs  01/05/13 1135  HGB 13.6  HCT 38.2  PLT 310  CREATININE 0.83    CrCl is unknown because there is no height on file for the current visit.   Medical History: Past Medical History  Diagnosis Date  . Depression   . Hypothyroidism   . GERD (gastroesophageal reflux disease)   . Diabetes mellitus, type 2   . IBS (irritable bowel syndrome)   . OA (osteoarthritis)   . Lymphocytosis 03/24/2012    Medications:   (Not in a hospital admission) Scheduled:  . aspirin  325 mg Oral Daily   Infusions:  . heparin    . heparin      Assessment:  72yo F with chest pain, SOB, nausea, and lightheadedness. Starting heparin for suspected ACS.  CBC is ok.  No coags have been ordered.  Goal of Therapy:  Heparin level 0.3-0.7 units/ml Monitor platelets by anticoagulation protocol: Yes   Plan:   Draw baseline aPTT and PT/INR.  Start heparin with 3500 unit IV bolus then infusion at 700 units/hr.  F/u heparin level in ~6hrs then daily.  Check CBC q48h while on heparin.  Charolotte Eke, PharmD, pager [phone]. 01/05/2013,1:09 PM.

## 2013-01-05 NOTE — H&P (Signed)
Patient ID: Angela Barrett MRN: [id number], DOB/AGE: 01/27/1941   Admit date: 01/05/2013  Primary Physician: Minda Meo, MD Primary Cardiologist: P. [place], MD   Pt. Profile:  72 y/o female with h/o chest pain and reportedly nl stress test about 8 yrs ago, who presented to [facility] ED today with a several month h/o DOE followed by a 1 wk h/o unstable angina culminating in rest angina and elevated troponins.  Problem List  Past Medical History  Diagnosis Date  . Depression   . Hypothyroidism   . GERD (gastroesophageal reflux disease)   . Diabetes mellitus, type 2   . IBS (irritable bowel syndrome)   . OA (osteoarthritis)   . Lymphocytosis 03/24/2012  . PONV (postoperative nausea and vomiting)   . Chest pain     a. nl myoview ~ 2006.    Past Surgical History  Procedure Laterality Date  . Thyroidectomy  2010    Allergies  Allergies  Allergen Reactions  . Amitriptyline Other (See Comments)    disoriented  . Cortisone Other (See Comments)    Eyes hurt and sugar goes up  . Macrodantin (Nitrofurantoin) Itching and Swelling  . Motrin (Ibuprofen) Other (See Comments)    Fluid retention   HPI  72 y/o female with prior h/o chest pain and nl myoview approximately 10 yrs ago.  She is fairly active at home, taking care of her husband and keeping house.  She has noticed that over the past 6 months or so, that she has been experiencing exertional dyspnea, which she attributed to deconditioning.  About a week ago, she began to experience exertional sscp/tightness, associated with dyspnea and bilat arm pain and tingling.  Ss would typically last 5-10 mins and resolve with rest but would then recur with any activity.  Over the past few days, Ss have been occurring @ rest.  Last night, chest pain became more intense and intermittent between 7 and 10 PM.  She took Ativan at 9P and felt well enough afterwards that she was able to fall off to sleep.  At 2 AM, she awoke suddenly with worsened  sscp, dyspnea, and arm pain.  This lasted about 10 mins and subsequently recurred in an intermittent fashion.  Because of recurrent pain, she asked her dtr to drive her to the ED.  She was not willing to go to [facility] and instead, her dtr drove her to [facility].  There, ECG was non-acute however poc troponin was elevated @ 0.16.  She was placed on heparin and ntg and transferred to [facility] for further eval.  She is currently pain free.  Home Medications  Prior to Admission medications   Medication Sig Start Date End Date Taking? Authorizing Provider  Artificial Tear Ointment (ARTIFICIAL TEARS) ointment Place 1 application into both eyes at bedtime.   Yes Historical Provider, MD  aspirin EC 81 MG tablet Take 81 mg by mouth 2 (two) times daily.   Yes Historical Provider, MD  exenatide (BYETTA) 10 MCG/0.04ML SOPN Inject 10 mcg into the skin daily.   Yes Historical Provider, MD  glucosamine-chondroitin 500-400 MG tablet Take 1 tablet by mouth every morning.   Yes Historical Provider, MD  insulin glargine (LANTUS) 100 UNIT/ML injection Inject 35 Units into the skin at bedtime.   Yes Historical Provider, MD  ketotifen (THERA TEARS ALLERGY) 0.025 % ophthalmic solution Place 1 drop into both eyes as needed (for allergy eyes.).   Yes Historical Provider, MD  levothyroxine (SYNTHROID, LEVOTHROID) 88 MCG tablet Take  88 mcg by mouth daily before breakfast.   Yes Historical Provider, MD  LORazepam (ATIVAN) 1 MG tablet Take 2.5 mg by mouth at bedtime.   Yes Historical Provider, MD  metFORMIN (GLUCOPHAGE-XR) 500 MG 24 hr tablet Take 500 mg by mouth daily with breakfast.   Yes Historical Provider, MD  pantoprazole (PROTONIX) 40 MG tablet Take 40 mg by mouth daily at 2 PM daily at 2 PM.   Yes Historical Provider, MD   Family History  Family History  Problem Relation Age of Onset  . Cancer Mother     bone CA, died @ 15  . Emphysema Father     died @ 66  . Emphysema Brother     died @ 6  . Diabetes Son     alive and  well.   Social History  History   Social History  . Marital Status: Married    Spouse Name: N/A    Number of Children: N/A  . Years of Education: N/A   Occupational History  . Not on file.   Social History Main Topics  . Smoking status: Never Smoker   . Smokeless tobacco: Never Used  . Alcohol Use: No  . Drug Use: No  . Sexually Active: No   Other Topics Concern  . Not on file   Social History Narrative   Lives in [place] with husband.    Review of Systems General:  No chills, fever, night sweats or weight changes.  Cardiovascular:  +++ chest pain, dyspnea on exertion as outlined above.  No edema, orthopnea, palpitations, paroxysmal nocturnal dyspnea. Dermatological: No rash, lesions/masses Respiratory: No cough, +++ dyspnea Urologic: No hematuria, dysuria Abdominal:   No nausea, vomiting, diarrhea, bright red blood per rectum, melena, or hematemesis Neurologic:  No visual changes, wkns, changes in mental status. All other systems reviewed and are otherwise negative except as noted above.  Physical Exam  Blood pressure 125/66, pulse 73, temperature 98.6 F (37 C), temperature source Oral, resp. rate 17, weight 130 lb (58.968 kg), SpO2 96.00%.  General: Pleasant, NAD Psych: Normal affect. Neuro: Alert and oriented X 3. Moves all extremities spontaneously. HEENT: Normal  Neck: Supple without bruits or JVD. Lungs:  Resp regular and unlabored, CTA. Heart: RRR no s3, s4, or murmurs. Abdomen: Soft, non-tender, non-distended, BS + x 4.  Extremities: No clubbing, cyanosis or edema. DP/PT/Radials 2+ and equal bilaterally.  Labs  poc trop i: 0.16  Lab Results  Component Value Date   WBC 6.5 01/05/2013   HGB 13.6 01/05/2013   HCT 38.2 01/05/2013   MCV 87.2 01/05/2013   PLT 310 01/05/2013     Recent Labs Lab 01/05/13 1135  NA 141  K 3.9  CL 104  CO2 25  BUN 12  CREATININE 0.83  CALCIUM 9.5  GLUCOSE 83   Radiology/Studies  Dg Chest 2 View  01/05/2013    *RADIOLOGY REPORT*  Clinical Data: Chest pain.  CHEST - 2 VIEW  Comparison: 07/05/2009.  Findings: The cardiac silhouette, mediastinal and hilar contours are normal.  The lungs are clear.  No pleural effusion.  The bony thorax is intact.  IMPRESSION: No acute cardiopulmonary findings.   Original Report Authenticated By: Rudie Meyer, M.D.   ECG  Rsr, 86, lad, no acute st/t changes.  ASSESSMENT AND PLAN  1.  NSTEMI:  Pt presents with progressive exertional dyspnea->angina, and now resting angina with mildly elevated troponin.  She is currently pain free on heparin and ntg.  Plan cath this  afternoon.  Cont asa.  Add high potency statin, and bb.  2.  DM:  Hold metformin.  Cont lantus/byetta.  Add SSI and statin.  Not on an ACEI as outpt.  If BP tolerates would look to add ACEI.  3.  Lipids:  Status unknown.  Adding lipitor 80.  F/u lipids/lft's.  Signed, Nicolasa Ducking, NP 01/05/2013, 2:28 PM  Patient examined and chart reviewed. Symptoms worrisome for angina although arm radiation is a bit atypical in her story.  Mild elevation in troponin  Favor cath for risk startification Risks including bleeding stroke and need for emergency surgery discussed with patient and family Willing to proceed Allens test is normal on right  [employer]

## 2013-01-05 NOTE — Interval H&P Note (Signed)
Cath Lab Visit (complete for each Cath Lab visit)  Clinical Evaluation Leading to the Procedure:   ACS: yes  Non-ACS:    Anginal Classification: CCS IV  Anti-ischemic medical therapy: Minimal Therapy (1 class of medications)  Non-Invasive Test Results: No non-invasive testing performed  Prior CABG: No previous CABG      History and Physical Interval Note:  01/05/2013 5:00 PM  NONNA RENNINGER  has presented today for surgery, with the diagnosis of Chest pain  The various methods of treatment have been discussed with the patient and family. After consideration of risks, benefits and other options for treatment, the patient has consented to  Procedure(s): LEFT HEART CATHETERIZATION WITH CORONARY ANGIOGRAM (N/A) as a surgical intervention .  The patient's history has been reviewed, patient examined, no change in status, stable for surgery.  I have reviewed the patient's chart and labs.  Questions were answered to the patient's satisfaction.     Lorine Bears

## 2013-01-05 NOTE — Care Management Note (Addendum)
    Page 1 of 1   01/12/2013     4:06:03 PM   CARE MANAGEMENT NOTE 01/12/2013  Patient:  Angela Barrett, Angela Barrett   Account Number:  [id number]  Date Initiated:  01/05/2013  Documentation initiated by:  Junius Creamer  Subjective/Objective Assessment:   adm w mi     Action/Plan:   lives w husband, pcp dr Quillian Quince   Anticipated DC Date:  01/12/2013   Anticipated DC Plan:  HOME/SELF CARE      DC Planning Services  CM consult      Choice offered to / List presented to:             Status of service:  Completed, signed off Medicare Important Message given?   (If response is "NO", the following Medicare IM given date fields will be blank) Date Medicare IM given:   Date Additional Medicare IM given:    Discharge Disposition:  HOME/SELF CARE  Per UR Regulation:  Reviewed for med. necessity/level of care/duration of stay  If discussed at Long Length of Stay Meetings, dates discussed:    Comments:  01/12/13 Rosalita Chessman [phone] PT DISCHARGING HOME WITH HUSAND AS CAREGIVER.  NO DC NEEDS NOTED.  6/4 1534 debbie dowell rn,bsn

## 2013-01-05 NOTE — ED Notes (Signed)
Pt reports 5/10 central chest pain that began 4-5 days ago, which worsened this morning. Pt reports shortness of breath, that is increased while walking. Pt states she has had nausea, however no vomiting. Pt states the pain radiates down her arms.

## 2013-01-06 ENCOUNTER — Inpatient Hospital Stay (HOSPITAL_COMMUNITY): Payer: Medicare Other

## 2013-01-06 ENCOUNTER — Other Ambulatory Visit: Payer: Self-pay | Admitting: *Deleted

## 2013-01-06 DIAGNOSIS — E1169 Type 2 diabetes mellitus with other specified complication: Secondary | ICD-10-CM | POA: Diagnosis not present

## 2013-01-06 DIAGNOSIS — I200 Unstable angina: Secondary | ICD-10-CM | POA: Diagnosis not present

## 2013-01-06 DIAGNOSIS — I2510 Atherosclerotic heart disease of native coronary artery without angina pectoris: Secondary | ICD-10-CM

## 2013-01-06 DIAGNOSIS — Z01810 Encounter for preprocedural cardiovascular examination: Secondary | ICD-10-CM

## 2013-01-06 DIAGNOSIS — I214 Non-ST elevation (NSTEMI) myocardial infarction: Secondary | ICD-10-CM | POA: Diagnosis not present

## 2013-01-06 DIAGNOSIS — E8779 Other fluid overload: Secondary | ICD-10-CM | POA: Diagnosis not present

## 2013-01-06 LAB — URINALYSIS, ROUTINE W REFLEX MICROSCOPIC
Bilirubin Urine: NEGATIVE
Glucose, UA: NEGATIVE mg/dL
Protein, ur: NEGATIVE mg/dL
Specific Gravity, Urine: 1.015 (ref 1.005–1.030)

## 2013-01-06 LAB — CBC
HCT: 33.3 % — ABNORMAL LOW (ref 36.0–46.0)
Hemoglobin: 11.9 g/dL — ABNORMAL LOW (ref 12.0–15.0)
MCH: 31.5 pg (ref 26.0–34.0)
MCV: 88.1 fL (ref 78.0–100.0)
Platelets: 278 10*3/uL (ref 150–400)
RBC: 3.78 MIL/uL — ABNORMAL LOW (ref 3.87–5.11)
WBC: 8.8 10*3/uL (ref 4.0–10.5)

## 2013-01-06 LAB — COMPREHENSIVE METABOLIC PANEL
AST: 16 U/L (ref 0–37)
Albumin: 3.6 g/dL (ref 3.5–5.2)
BUN: 13 mg/dL (ref 6–23)
Calcium: 9 mg/dL (ref 8.4–10.5)
Chloride: 106 mEq/L (ref 96–112)
Creatinine, Ser: 0.83 mg/dL (ref 0.50–1.10)
Total Protein: 6.6 g/dL (ref 6.0–8.3)

## 2013-01-06 LAB — LIPID PANEL
Cholesterol: 229 mg/dL — ABNORMAL HIGH (ref 0–200)
Triglycerides: 140 mg/dL (ref <150)
VLDL: 28 mg/dL (ref 0–40)

## 2013-01-06 LAB — GLUCOSE, CAPILLARY
Glucose-Capillary: 92 mg/dL (ref 70–99)
Glucose-Capillary: 166 mg/dL — ABNORMAL HIGH (ref 70–99)

## 2013-01-06 LAB — POCT I-STAT 3, ART BLOOD GAS (G3+)
Bicarbonate: 23.6 mEq/L (ref 20.0–24.0)
O2 Saturation: 98 %
TCO2: 25 mmol/L (ref 0–100)
pCO2 arterial: 36.1 mmHg (ref 35.0–45.0)
pH, Arterial: 7.423 (ref 7.350–7.450)

## 2013-01-06 LAB — URINE MICROSCOPIC-ADD ON

## 2013-01-06 LAB — HEPARIN LEVEL (UNFRACTIONATED)
Heparin Unfractionated: 0.16 IU/mL — ABNORMAL LOW (ref 0.30–0.70)
Heparin Unfractionated: 0.25 IU/mL — ABNORMAL LOW (ref 0.30–0.70)

## 2013-01-06 MED ORDER — ALBUTEROL SULFATE (5 MG/ML) 0.5% IN NEBU
2.5000 mg | INHALATION_SOLUTION | Freq: Once | RESPIRATORY_TRACT | Status: AC
  Administered 2013-01-06: 2.5 mg via RESPIRATORY_TRACT

## 2013-01-06 MED ORDER — CHLORHEXIDINE GLUCONATE 4 % EX LIQD
60.0000 mL | Freq: Once | CUTANEOUS | Status: AC
  Administered 2013-01-06: 4 via TOPICAL
  Filled 2013-01-06: qty 60

## 2013-01-06 MED ORDER — METOPROLOL TARTRATE 12.5 MG HALF TABLET
12.5000 mg | ORAL_TABLET | Freq: Once | ORAL | Status: AC
Start: 2013-01-07 — End: 2013-01-07
  Administered 2013-01-07: 12.5 mg via ORAL
  Filled 2013-01-06: qty 1

## 2013-01-06 MED ORDER — TEMAZEPAM 15 MG PO CAPS: 15.0000 mg | ORAL_CAPSULE | Freq: Once | ORAL | Status: DC | PRN

## 2013-01-06 MED ORDER — EPINEPHRINE HCL 1 MG/ML IJ SOLN
0.5000 ug/min | INTRAVENOUS | Status: DC
  Filled 2013-01-06: qty 4

## 2013-01-06 MED ORDER — SODIUM CHLORIDE 0.9 % IV SOLN
INTRAVENOUS | Status: DC | PRN
  Administered 2013-01-07: 13:00:00 via INTRAVENOUS

## 2013-01-06 MED ORDER — SODIUM CHLORIDE 0.9 % IV SOLN
INTRAVENOUS | Status: DC
  Filled 2013-01-06: qty 30

## 2013-01-06 MED ORDER — POTASSIUM CHLORIDE 2 MEQ/ML IV SOLN
80.0000 meq | INTRAVENOUS | Status: DC
  Filled 2013-01-06: qty 40

## 2013-01-06 MED ORDER — DEXTROSE 5 % IV SOLN
1.5000 g | INTRAVENOUS | Status: AC
  Administered 2013-01-07: 1.5 g via INTRAVENOUS
  Filled 2013-01-06: qty 1.5

## 2013-01-06 MED ORDER — NITROGLYCERIN IN D5W 200-5 MCG/ML-% IV SOLN
2.0000 ug/min | INTRAVENOUS | Status: DC
  Filled 2013-01-06: qty 250

## 2013-01-06 MED ORDER — SODIUM CHLORIDE 0.9 % IV SOLN
INTRAVENOUS | Status: DC
  Filled 2013-01-06: qty 40

## 2013-01-06 MED ORDER — PLASMA-LYTE 148 IV SOLN
INTRAVENOUS | Status: AC
  Administered 2013-01-07: 09:00:00
  Filled 2013-01-06: qty 2.5

## 2013-01-06 MED ORDER — SODIUM CHLORIDE 0.9 % IV SOLN
INTRAVENOUS | Status: AC
  Administered 2013-01-07: 3.3 [IU]/h via INTRAVENOUS
  Filled 2013-01-06: qty 1

## 2013-01-06 MED ORDER — VANCOMYCIN HCL 10 G IV SOLR
1250.0000 mg | INTRAVENOUS | Status: AC
  Administered 2013-01-07: 1250 mg via INTRAVENOUS
  Filled 2013-01-06: qty 1250

## 2013-01-06 MED ORDER — BISACODYL 5 MG PO TBEC
5.0000 mg | DELAYED_RELEASE_TABLET | Freq: Once | ORAL | Status: AC
  Administered 2013-01-06: 5 mg via ORAL
  Filled 2013-01-06: qty 1

## 2013-01-06 MED ORDER — DEXMEDETOMIDINE HCL IN NACL 400 MCG/100ML IV SOLN
0.1000 ug/kg/h | INTRAVENOUS | Status: AC
  Administered 2013-01-07: 0.7 ug/kg/h via INTRAVENOUS
  Filled 2013-01-06: qty 100

## 2013-01-06 MED ORDER — DIAZEPAM 2 MG PO TABS
2.0000 mg | ORAL_TABLET | Freq: Once | ORAL | Status: AC
  Administered 2013-01-07: 2 mg via ORAL
  Filled 2013-01-06: qty 1

## 2013-01-06 MED ORDER — MAGNESIUM SULFATE 50 % IJ SOLN
40.0000 meq | INTRAMUSCULAR | Status: DC
  Filled 2013-01-06: qty 10

## 2013-01-06 MED ORDER — PHENYLEPHRINE HCL 10 MG/ML IJ SOLN
30.0000 ug/min | INTRAVENOUS | Status: AC
  Administered 2013-01-07: 40 ug/min via INTRAVENOUS
  Filled 2013-01-06: qty 2

## 2013-01-06 MED ORDER — DEXTROSE 5 % IV SOLN
750.0000 mg | INTRAVENOUS | Status: DC
  Filled 2013-01-06: qty 750

## 2013-01-06 MED ORDER — MORPHINE SULFATE 4 MG/ML IJ SOLN
4.0000 mg | INTRAMUSCULAR | Status: DC | PRN
  Administered 2013-01-06 (×3): 2 mg via INTRAVENOUS
  Filled 2013-01-06 (×2): qty 1

## 2013-01-06 MED ORDER — ALPRAZOLAM 0.25 MG PO TABS: 0.2500 mg | ORAL_TABLET | ORAL | Status: DC | PRN

## 2013-01-06 MED ORDER — CHLORHEXIDINE GLUCONATE 4 % EX LIQD
60.0000 mL | Freq: Once | CUTANEOUS | Status: AC
  Administered 2013-01-07: 4 via TOPICAL
  Filled 2013-01-06 (×2): qty 60

## 2013-01-06 MED ORDER — DOPAMINE-DEXTROSE 3.2-5 MG/ML-% IV SOLN
2.0000 ug/kg/min | INTRAVENOUS | Status: DC
  Filled 2013-01-06: qty 250

## 2013-01-06 NOTE — Consult Note (Signed)
Reason for Consult:3 vessel CAD Referring Physician: Dr. Arida  Angela Barrett is an 71 y.o. female.  HPI: 71 yo WF presented with a cc/o CP  Mrs. Angela Barrett is a 71 yo woman with a history of hyperlipidemia and diabetes. No prior history of CAD. She did have a cardiac work up in 2006 which included a negative stress test. She began having chest pain about a week ago. She describes this as a pressure/ burning sensation with exertion. She would also get pain in her hands. Initially relieved with rest. Over the next 7 days she continued to have pain with almost any exertion. It became progressively more frequent and severe as the week went on. She initially thought it was reflux, but eventually the symptoms became so severe she went to the ED. Her initial troponins were positive. She ruled in for a NQWMI. Yesterday she had cardiac catheterization which revealed severe 3 vessel CAD. She is currently pain free. She does note that for the past 6-8 months she has been feeling very tired and run down.  Past Medical History  Diagnosis Date  . Depression   . Hypothyroidism   . GERD (gastroesophageal reflux disease)   . Diabetes mellitus, type 2   . IBS (irritable bowel syndrome)   . OA (osteoarthritis)   . Lymphocytosis 03/24/2012  . PONV (postoperative nausea and vomiting)   . Chest pain     a. nl myoview ~ 2006.    Past Surgical History  Procedure Laterality Date  . Thyroidectomy  2010    Family History  Problem Relation Age of Onset  . Cancer Mother     bone CA, died @ 66  . Emphysema Father     died @ 59  . Emphysema Brother     died @ 55  . Diabetes Son     alive and well.    Social History:  reports that she has never smoked. She has never used smokeless tobacco. She reports that she does not drink alcohol or use illicit drugs.  Allergies:  Allergies  Allergen Reactions  . Amitriptyline Other (See Comments)    disoriented  . Cortisone Other (See Comments)    Eyes hurt and sugar  goes up  . Macrodantin (Nitrofurantoin) Itching and Swelling  . Motrin (Ibuprofen) Other (See Comments)    Fluid retention    Medications:  Prior to Admission:  Prescriptions prior to admission  Medication Sig Dispense Refill  . Artificial Tear Ointment (ARTIFICIAL TEARS) ointment Place 1 application into both eyes at bedtime.      . aspirin EC 81 MG tablet Take 81 mg by mouth 2 (two) times daily.      . exenatide (BYETTA) 10 MCG/0.04ML SOPN Inject 10 mcg into the skin daily.      . glucosamine-chondroitin 500-400 MG tablet Take 1 tablet by mouth every morning.      . insulin glargine (LANTUS) 100 UNIT/ML injection Inject 35 Units into the skin at bedtime.      . ketotifen (THERA TEARS ALLERGY) 0.025 % ophthalmic solution Place 1 drop into both eyes as needed (for allergy eyes.).      . levothyroxine (SYNTHROID, LEVOTHROID) 88 MCG tablet Take 88 mcg by mouth daily before breakfast.      . LORazepam (ATIVAN) 1 MG tablet Take 2.5 mg by mouth at bedtime.      . metFORMIN (GLUCOPHAGE-XR) 500 MG 24 hr tablet Take 500 mg by mouth daily with breakfast.      .   pantoprazole (PROTONIX) 40 MG tablet Take 40 mg by mouth daily at 2 PM daily at 2 PM.        Results for orders placed during the hospital encounter of 01/05/13 (from the past 48 hour(s))  GLUCOSE, CAPILLARY     Status: None   Collection Time    01/05/13 11:28 AM      Result Value Range   Glucose-Capillary 82  70 - 99 mg/dL  CBC     Status: None   Collection Time    01/05/13 11:35 AM      Result Value Range   WBC 6.5  4.0 - 10.5 K/uL   RBC 4.38  3.87 - 5.11 MIL/uL   Hemoglobin 13.6  12.0 - 15.0 g/dL   HCT 38.2  36.0 - 46.0 %   MCV 87.2  78.0 - 100.0 fL   MCH 31.1  26.0 - 34.0 pg   MCHC 35.6  30.0 - 36.0 g/dL   RDW 11.8  11.5 - 15.5 %   Platelets 310  150 - 400 K/uL  BASIC METABOLIC PANEL     Status: Abnormal   Collection Time    01/05/13 11:35 AM      Result Value Range   Sodium 141  135 - 145 mEq/L   Potassium 3.9  3.5 -  5.1 mEq/L   Chloride 104  96 - 112 mEq/L   CO2 25  19 - 32 mEq/L   Glucose, Bld 83  70 - 99 mg/dL   BUN 12  6 - 23 mg/dL   Creatinine, Ser 0.83  0.50 - 1.10 mg/dL   Calcium 9.5  8.4 - 10.5 mg/dL   GFR calc non Af Amer 69 (*) >90 mL/min   GFR calc Af Amer 80 (*) >90 mL/min   Comment:            The eGFR has been calculated     using the CKD EPI equation.     This calculation has not been     validated in all clinical     situations.     eGFR's persistently     <90 mL/min signify     possible Chronic Kidney Disease.  PRO B NATRIURETIC PEPTIDE     Status: Abnormal   Collection Time    01/05/13 11:35 AM      Result Value Range   Pro B Natriuretic peptide (BNP) 986.9 (*) 0 - 125 pg/mL  POCT I-STAT TROPONIN I     Status: Abnormal   Collection Time    01/05/13 11:56 AM      Result Value Range   Troponin i, poc 0.16 (*) 0.00 - 0.08 ng/mL   Comment NOTIFIED PHYSICIAN     Comment 3            Comment: Due to the release kinetics of cTnI,     a negative result within the first hours     of the onset of symptoms does not rule out     myocardial infarction with certainty.     If myocardial infarction is still suspected,     repeat the test at appropriate intervals.  APTT     Status: None   Collection Time    01/05/13 12:30 PM      Result Value Range   aPTT 29  24 - 37 seconds  PROTIME-INR     Status: None   Collection Time    01/05/13 12:30 PM        Result Value Range   Prothrombin Time 12.5  11.6 - 15.2 seconds   INR 0.94  0.00 - 1.49  MRSA PCR SCREENING     Status: None   Collection Time    01/05/13  2:23 PM      Result Value Range   MRSA by PCR NEGATIVE  NEGATIVE   Comment:            The GeneXpert MRSA Assay (FDA     approved for NASAL specimens     only), is one component of a     comprehensive MRSA colonization     surveillance program. It is not     intended to diagnose MRSA     infection nor to guide or     monitor treatment for     MRSA infections.  TROPONIN I      Status: None   Collection Time    01/05/13  3:35 PM      Result Value Range   Troponin I <0.30  <0.30 ng/mL   Comment:            Due to the release kinetics of cTnI,     a negative result within the first hours     of the onset of symptoms does not rule out     myocardial infarction with certainty.     If myocardial infarction is still suspected,     repeat the test at appropriate intervals.  GLUCOSE, CAPILLARY     Status: Abnormal   Collection Time    01/05/13  4:30 PM      Result Value Range   Glucose-Capillary 103 (*) 70 - 99 mg/dL  TROPONIN I     Status: Abnormal   Collection Time    01/05/13  8:22 PM      Result Value Range   Troponin I 0.46 (*) <0.30 ng/mL   Comment:            Due to the release kinetics of cTnI,     a negative result within the first hours     of the onset of symptoms does not rule out     myocardial infarction with certainty.     If myocardial infarction is still suspected,     repeat the test at appropriate intervals.     CRITICAL RESULT CALLED TO, READ BACK BY AND VERIFIED WITH:     H CHURCH,RN 2112 01/05/13 WBOND  GLUCOSE, CAPILLARY     Status: Abnormal   Collection Time    01/05/13  9:09 PM      Result Value Range   Glucose-Capillary 131 (*) 70 - 99 mg/dL  TROPONIN I     Status: Abnormal   Collection Time    01/06/13  2:44 AM      Result Value Range   Troponin I 0.42 (*) <0.30 ng/mL   Comment:            Due to the release kinetics of cTnI,     a negative result within the first hours     of the onset of symptoms does not rule out     myocardial infarction with certainty.     If myocardial infarction is still suspected,     repeat the test at appropriate intervals.     CRITICAL VALUE NOTED.  VALUE IS CONSISTENT WITH PREVIOUSLY REPORTED AND CALLED VALUE.  COMPREHENSIVE METABOLIC PANEL     Status: Abnormal   Collection Time      01/06/13  2:45 AM      Result Value Range   Sodium 139  135 - 145 mEq/L   Potassium 3.6  3.5 - 5.1 mEq/L    Chloride 106  96 - 112 mEq/L   CO2 24  19 - 32 mEq/L   Glucose, Bld 75  70 - 99 mg/dL   BUN 13  6 - 23 mg/dL   Creatinine, Ser 0.83  0.50 - 1.10 mg/dL   Calcium 9.0  8.4 - 10.5 mg/dL   Total Protein 6.6  6.0 - 8.3 g/dL   Albumin 3.6  3.5 - 5.2 g/dL   AST 16  0 - 37 U/L   ALT 11  0 - 35 U/L   Alkaline Phosphatase 56  39 - 117 U/L   Total Bilirubin 0.5  0.3 - 1.2 mg/dL   GFR calc non Af Amer 69 (*) >90 mL/min   GFR calc Af Amer 80 (*) >90 mL/min   Comment:            The eGFR has been calculated     using the CKD EPI equation.     This calculation has not been     validated in all clinical     situations.     eGFR's persistently     <90 mL/min signify     possible Chronic Kidney Disease.  CBC     Status: Abnormal   Collection Time    01/06/13  2:45 AM      Result Value Range   WBC 8.8  4.0 - 10.5 K/uL   RBC 3.78 (*) 3.87 - 5.11 MIL/uL   Hemoglobin 11.9 (*) 12.0 - 15.0 g/dL   HCT 33.3 (*) 36.0 - 46.0 %   MCV 88.1  78.0 - 100.0 fL   MCH 31.5  26.0 - 34.0 pg   MCHC 35.7  30.0 - 36.0 g/dL   RDW 12.2  11.5 - 15.5 %   Platelets 278  150 - 400 K/uL  LIPID PANEL     Status: Abnormal   Collection Time    01/06/13  2:45 AM      Result Value Range   Cholesterol 229 (*) 0 - 200 mg/dL   Triglycerides 140  <150 mg/dL   HDL 37 (*) >39 mg/dL   Total CHOL/HDL Ratio 6.2     VLDL 28  0 - 40 mg/dL   LDL Cholesterol 164 (*) 0 - 99 mg/dL   Comment:            Total Cholesterol/HDL:CHD Risk     Coronary Heart Disease Risk Table                         Men   Women      1/2 Average Risk   3.4   3.3      Average Risk       5.0   4.4      2 X Average Risk   9.6   7.1      3 X Average Risk  23.4   11.0                Use the calculated Patient Ratio     above and the CHD Risk Table     to determine the patient's CHD Risk.                ATP III CLASSIFICATION (LDL):      <  100     mg/dL   Optimal      100-129  mg/dL   Near or Above                        Optimal      130-159  mg/dL    Borderline      160-189  mg/dL   High      >190     mg/dL   Very High  GLUCOSE, CAPILLARY     Status: None   Collection Time    01/06/13  7:36 AM      Result Value Range   Glucose-Capillary 89  70 - 99 mg/dL  HEPARIN LEVEL (UNFRACTIONATED)     Status: Abnormal   Collection Time    01/06/13 10:41 AM      Result Value Range   Heparin Unfractionated 0.16 (*) 0.30 - 0.70 IU/mL   Comment:            IF HEPARIN RESULTS ARE BELOW     EXPECTED VALUES, AND PATIENT     DOSAGE HAS BEEN CONFIRMED,     SUGGEST FOLLOW UP TESTING     OF ANTITHROMBIN III LEVELS.  GLUCOSE, CAPILLARY     Status: None   Collection Time    01/06/13 11:59 AM      Result Value Range   Glucose-Capillary 92  70 - 99 mg/dL    Dg Chest 2 View  01/05/2013   *RADIOLOGY REPORT*  Clinical Data: Chest pain.  CHEST - 2 VIEW  Comparison: 07/05/2009.  Findings: The cardiac silhouette, mediastinal and hilar contours are normal.  The lungs are clear.  No pleural effusion.  The bony thorax is intact.  IMPRESSION: No acute cardiopulmonary findings.   Original Report Authenticated By: P. Gallerani, M.D.    Review of Systems  Constitutional: Positive for malaise/fatigue.  Respiratory: Positive for shortness of breath. Negative for wheezing.   Cardiovascular: Positive for chest pain. Negative for leg swelling.  Gastrointestinal: Positive for heartburn.       Feels like food is sticking  Musculoskeletal: Positive for joint pain.  Neurological: Negative.   Endo/Heme/Allergies: Does not bruise/bleed easily.       Thyroidectomy, diabetes  Psychiatric/Behavioral: Positive for depression.  All other systems reviewed and are negative.   Blood pressure 115/58, pulse 84, temperature 98.9 F (37.2 C), temperature source Oral, resp. rate 14, height 5' 2" (1.575 m), weight 130 lb (58.968 kg), SpO2 100.00%. Physical Exam  Vitals reviewed. Constitutional: She is oriented to person, place, and time. She appears well-developed and  well-nourished. No distress.  HENT:  Head: Normocephalic and atraumatic.  alopecia  Eyes: EOM are normal. Pupils are equal, round, and reactive to light.  Neck: Neck supple. No thyromegaly present.  Well healed surgical scar  Cardiovascular: Normal rate, regular rhythm and intact distal pulses.  Exam reveals no gallop and no friction rub.   No murmur heard. Respiratory: Effort normal and breath sounds normal. She has no wheezes. She has no rales.  GI: Soft. There is no tenderness.  Musculoskeletal: Normal range of motion. She exhibits edema.  Neurological: She is alert and oriented to person, place, and time. No cranial nerve deficit.  No focal motor deficit  Skin: Skin is warm and dry.  Psychiatric: She has a normal mood and affect.   CARDIAC CATHETERIZATION] Procedural Findings:  Hemodynamics:  AO: 118/52 mmHg  LV: 121/5 mmHg  LVEDP: 11 mmHg  Coronary angiography:    Coronary dominance: right  Left Main: Normal in size with 20% distal disease.  Left anterior descending artery: Normal in size and heavily calcified in the proximal segment. There is a 60% proximal stenosis. In the midsegment, there is diffuse 50-60% disease and significant remodeling and the vessel. There is a 70-80% lesion distally.  1st diagonal (D1): Normal in size with 90% proximal disease.  2nd diagonal (D2): Very small in size.  3rd diagonal (D3): Very small in size.  Circumflex (LCx): Normal in size and nondominant. The vessel is mildly calcified. There is 95% diffuse disease in the midsegment. This is likely the culprit for NSTEMI.  1st obtuse marginal: Small in size with minor irregularities.  2nd obtuse marginal: Normal in size with 20% diffuse disease proximally.  3rd obtuse marginal: Normal in size with minor irregularities.  Right Coronary Artery: Normal in size and dominant. There is diffuse 40% disease proximally. There is an 80% discrete lesion in the midsegment. The rest of the vessel has minor  irregularities.  Posterior descending artery: Medium in size with no significant disease.  Posterior AV segment: Normal in size with diffuse 20% disease.  Posterolateral branchs: 40% disease proximally. Left ventriculography: Left ventricular systolic function is low normal , LVEF is estimated at 50 %, there is no significant mitral regurgitation  Final Conclusions:  1. Significant three-vessel coronary artery disease.  2. Low normal LV systolic function with an ejection fraction of 50%. Normal left ventricular end-diastolic pressure.  Recommendations:  I consulted CVTS for CABG. Although the disease in the left circumflex and right coronary artery is approachable by PCI, the patient has calcified and diffusely diseased diabetic vessels. Thus, CABG is probably a better option. She has reasonable targets in diagonal, LAD, OM 3 and right PL  Resume Heparin 8 hours post sheath pull.     Assessment/Plan: 71 yo woman with multiple CRF presents with an unstable coronary syndrome. She has been found to severe 3 vessel CAD with mild LV dysfunction. CABG is indicated for survival benefit and relief of symptoms.  I discussed with the patient and her family the general nature of the procedure, the need for general anesthesia, and the incisions to be used. I discussed the expected hospital stay, overall recovery and short and long term outcomes. We discussed the indications, risks, benefits and alternatives. She understands the risks include but are not limited to death, stroke, MI, DVT/PE, bleeding, possible need for transfusion, infections, cardiac arrhythmias and other organ system dysfunction, including respiratory, renal, or GI complications. She accepts these risks and agrees to proceed.  Carotid duplex OK  For CABG in AM  HENDRICKSON,STEVEN C 01/06/2013, 1:29 PM      

## 2013-01-06 NOTE — Progress Notes (Signed)
ANTICOAGULATION CONSULT NOTE - Follow Up Consult  Pharmacy Consult for Heparin Indication: 3VCAD  Allergies  Allergen Reactions  . Amitriptyline Other (See Comments)    disoriented  . Cortisone Other (See Comments)    Eyes hurt and sugar goes up  . Macrodantin (Nitrofurantoin) Itching and Swelling  . Motrin (Ibuprofen) Other (See Comments)    Fluid retention    Patient Measurements: Height: 5\' 2"  (157.5 cm) Weight: 130 lb (58.968 kg) IBW/kg (Calculated) : 50.1 Heparin Dosing Weight:    Vital Signs: Temp: 98.3 F (36.8 C) (06/05 0735) Temp src: Oral (06/05 0735) BP: 108/55 mmHg (06/05 1046) Pulse Rate: 72 (06/05 0854)  Labs:  Recent Labs  01/05/13 1135 01/05/13 1230 01/05/13 1535 01/05/13 2022 01/06/13 0244 01/06/13 0245 01/06/13 1041  HGB 13.6  --   --   --   --  11.9*  --   HCT 38.2  --   --   --   --  33.3*  --   PLT 310  --   --   --   --  278  --   APTT  --  29  --   --   --   --   --   LABPROT  --  12.5  --   --   --   --   --   INR  --  0.94  --   --   --   --   --   HEPARINUNFRC  --   --   --   --   --   --  0.16*  CREATININE 0.83  --   --   --   --  0.83  --   TROPONINI  --   --  <0.30 0.46* 0.42*  --   --     Estimated Creatinine Clearance: 49.2 ml/min (by C-G formula based on Cr of 0.83).   Assessment: 71yo F with chest pain, SOB, nausea, and lightheadedness.  Cath: 3VCAD with EF 50%. Has continued to have CP  Anticoag: Heparin resumed post-cath. Heparin level 0.16 <goal. No bleeding from radial cath site. Hgb down to 11.9  Cards: 108/55, 72 with 3VCAD awaiting CVTS consult. Meds: ASA 81mg , Lipitor, Metoprolol  Endo: hypothyroid on levothyroxine, DM: Byetta, Lantus, SSI. CBGs 82-131  GI/Nutrition: GERD, IBS on PPI  Neuro: h/o depression  Home med issues: metformin  Goal of Therapy:  Heparin level 0.3-0.7 units/ml Monitor platelets by anticoagulation protocol: Yes   Plan:  Increase IV heparin to 950 units/hr and recheck level in 6  hrs.  Merilynn Finland, [employer] 01/06/2013,11:30 AM

## 2013-01-06 NOTE — Progress Notes (Signed)
CRITICAL VALUE ALERT  Critical value received:  Trop 0.46  Date of notification:  01/05/2013  Time of notification:  2112  Critical value read back:yes  Nurse who received alert:  Tonette Lederer   Expected value. Will continue to monitor closely.

## 2013-01-06 NOTE — Plan of Care (Signed)
Problem: Phase I Progression Outcomes Goal: Initial discharge plan identified Outcome: Progressing Discussed with pt and family the need to have 24 hr care post d/c for about the first week as least.  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

## 2013-01-06 NOTE — Plan of Care (Signed)
Problem: Consults Goal: Cardiac Surgery Patient Education ( See Patient Education module for education specifics.) Outcome: Progressing CABG book, pre surgery video

## 2013-01-06 NOTE — Progress Notes (Signed)
VASCULAR LAB PRELIMINARY  PRELIMINARY  PRELIMINARY  PRELIMINARY  Pre-op Cardiac Surgery  Carotid Findings:  Bilateral:  No evidence of hemodynamically significant internal carotid artery stenosis.   Vertebral artery flow is antegrade.     Thereasa Parkin, RVT 01/06/2013 11:46 AM    Upper Extremity Right Left  Brachial Pressures    Radial Waveforms    Ulnar Waveforms    Palmar Arch (Allen's Test)     Findings:      Lower  Extremity Right Left  Dorsalis Pedis    Anterior Tibial    Posterior Tibial    Ankle/Brachial Indices      Findings:

## 2013-01-06 NOTE — Progress Notes (Signed)
ANTICOAGULATION CONSULT NOTE - Follow Up Consult  Pharmacy Consult for Heparin Indication: 3VCAD  Allergies  Allergen Reactions  . Amitriptyline Other (See Comments)    disoriented  . Cortisone Other (See Comments)    Eyes hurt and sugar goes up  . Macrodantin (Nitrofurantoin) Itching and Swelling  . Motrin (Ibuprofen) Other (See Comments)    Fluid retention    Patient Measurements: Height: 5\' 2"  (157.5 cm) Weight: 130 lb (58.968 kg) IBW/kg (Calculated) : 50.1 Heparin Dosing Weight:    Vital Signs: Temp: 99.7 F (37.6 C) (06/05 1911) Temp src: Oral (06/05 1911) BP: 112/52 mmHg (06/05 1911) Pulse Rate: 84 (06/05 1151)  Labs:  Recent Labs  01/05/13 1135 01/05/13 1230 01/05/13 1535 01/05/13 2022 01/06/13 0244 01/06/13 0245 01/06/13 1041 01/06/13 1840  HGB 13.6  --   --   --   --  11.9*  --   --   HCT 38.2  --   --   --   --  33.3*  --   --   PLT 310  --   --   --   --  278  --   --   APTT  --  29  --   --   --   --   --   --   LABPROT  --  12.5  --   --   --   --   --   --   INR  --  0.94  --   --   --   --   --   --   HEPARINUNFRC  --   --   --   --   --   --  0.16* 0.25*  CREATININE 0.83  --   --   --   --  0.83  --   --   TROPONINI  --   --  <0.30 0.46* 0.42*  --   --   --     Estimated Creatinine Clearance: 49.2 ml/min (by C-G formula based on Cr of 0.83).   Assessment: 71yo F with chest pain, SOB, nausea, and lightheadedness.  Cath: 3VCAD with EF 50%. Has continued to have CP  Anticoag: Heparin resumed post-cath. Heparin level 0.16 <goal. No bleeding from radial cath site. Hgb down to 11.9 Heparin drip rate was increased to 950 uts/hr with resultant HL 0.25.  This is rising from earlier HL and with plans for heparin to be turned off in 6hr for surgery - no rate change - f/u after OR  Goal of Therapy:  Heparin level 0.3-0.7 units/ml Monitor platelets by anticoagulation protocol: Yes   Plan:  Continue Heparin drip 950 uts/hr  Leota Sauers Pharm.D.  CPP, BCPS Clinical Pharmacist [phone] 01/06/2013 8:38 PM

## 2013-01-06 NOTE — Progress Notes (Signed)
[id number] Education completed with pt. Gave her [facility] booklet. Discussed sternal precautions and showed pt how to get up and down without using arms. Discussed importance of activity after surgery. Pt can get to 1500 ml on IS. Put on preop video for pt to view. Pt states her children are going to stay with her and husband after surgery. We will follow up after surgery. Luetta Nutting RNBSN

## 2013-01-06 NOTE — Progress Notes (Signed)
Pt was resting in the room when family came into visit. Shortly after pt started to complain of sudden CP. All VSS, NTG gtt increased, CP resolved as sudden as it started. Spoke with pt and family how important it was to allow her to rest and keep visitors and visitation to a minium. Pt and family stated understanding. Will continue to monitor closely.

## 2013-01-06 NOTE — ED Provider Notes (Signed)
Medical screening examination/treatment/procedure(s) were conducted as a shared visit with non-physician practitioner(s) and myself.  I personally evaluated the patient during the encounter  CRITICAL CARE Performed by: Lyanne Co Total critical care time: 30 Critical care time was exclusive of separately billable procedures and treating other patients. Critical care was necessary to treat or prevent imminent or life-threatening deterioration. Critical care was time spent personally by me on the following activities: development of treatment plan with patient and/or surrogate as well as nursing, discussions with consultants, evaluation of patient's response to treatment, examination of patient, obtaining history from patient or surrogate, ordering and performing treatments and interventions, ordering and review of laboratory studies, ordering and review of radiographic studies, pulse oximetry and re-evaluation of patient's condition.  Pt presents with active CP and symptoms concerning for acute coronary syndrome. No ECG changes. Troponin elevated. Asa and heparin. Nitro gtt.  Reevaluated the pt multiple times given her age and concerning story. Spoke with cardiology who accepts pt in transfer to [facility]. CP free at time of transfer.   Cardiology- Dr Eden Emms  Dg Chest 2 View  01/05/2013   *RADIOLOGY REPORT*  Clinical Data: Chest pain.  CHEST - 2 VIEW  Comparison: 07/05/2009.  Findings: The cardiac silhouette, mediastinal and hilar contours are normal.  The lungs are clear.  No pleural effusion.  The bony thorax is intact.  IMPRESSION: No acute cardiopulmonary findings.   Original Report Authenticated By: Rudie Meyer, M.D.  I personally reviewed the imaging tests through PACS system I reviewed available ER/hospitalization records through the EMR   Lyanne Co, MD 01/06/13 2150

## 2013-01-06 NOTE — Progress Notes (Signed)
VASCULAR LAB PRELIMINARY  PRELIMINARY  PRELIMINARY  PRELIMINARY  Pre-op Cardiac Surgery  Carotid Findings:  Bilateral:  No evidence of hemodynamically significant internal carotid artery stenosis.   Vertebral artery flow is antegrade.     Thereasa Parkin, RVT 01/06/2013 5:53 PM    Upper Extremity Right Left  Brachial Pressures 109 Triphasic 105 Triphasic  Radial Waveforms Triphasic Triphasic  Ulnar Waveforms Triphasic Triphasic  Palmar Arch (Allen's Test) Normal Normal    Findings:  Doppler waveforms remained normal bilaterally with both radial and ulnar compressions    Lower  Extremity Right Left  Dorsalis Pedis 113 Biphasic 103 Biphasic  Posterior Tibial 111 Biphasic 72 Biphasic  Ankle/Brachial Indices 1.04 0.94     Findings:  ABIs and Doppler waveforms are within normal limits bilaterally at rest   [facility], RVS 01/06/2013 5:58 PM

## 2013-01-06 NOTE — Progress Notes (Signed)
Pt states she is having CP. VSS, EKG done-no changes noted. Pt placed on O2 via [place] at 3L, NTG gtt increased. Will continue to monitor closely.

## 2013-01-06 NOTE — Progress Notes (Signed)
PROGRESS NOTE  Subjective:   Angela Barrett is a 72 yo with recent episodes of CP - + troponins, .  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    Objective:    Vital Signs:   Temp:  [97.5 F (36.4 C)-98.7 F (37.1 C)] 97.5 F (36.4 C) (06/05 0400) Pulse Rate:  [58-102] 64 (06/05 0400) Resp:  [11-24] 17 (06/05 0400) BP: (91-144)/(32-88) 94/50 mmHg (06/05 0400) SpO2:  [90 %-100 %] 99 % (06/05 0400) FiO2 (%):  [100 %] 100 % (06/04 1454) Weight:  [130 lb (58.968 kg)] 130 lb (58.968 kg) (06/04 1230)  Last BM Date: 01/05/13   24-hour weight change: Weight change:   Weight trends: Filed Weights   01/05/13 1230  Weight: 130 lb (58.968 kg)    Intake/Output:  06/04 0701 - 06/05 0700 In: 1575.3 [P.O.:600; I.V.:975.3] Out: 1550 [Urine:1550]     Physical Exam: BP 94/50  Pulse 64  Temp(Src) 97.5 F (36.4 C) (Oral)  Resp 17  Ht 5\' 2"  (1.575 m)  Wt 130 lb (58.968 kg)  BMI 23.77 kg/m2  SpO2 99%  General: Vital signs reviewed and noted.   Head: Normocephalic, atraumatic.  Eyes: conjunctivae/corneas clear.  EOM's intact.   Throat: normal  Neck:  normal  Lungs:    clear  Heart:  RR,normal S1, S2  Abdomen:  Soft, non-tender, non-distended    Extremities: No c/c/e. Right radial cath site is normal   Neurologic: A&O X3, CN II - XII are grossly intact.   Psych: Normal    Labs: BMET:  Recent Labs  01/05/13 1135 01/06/13 0245  NA 141 139  K 3.9 3.6  CL 104 106  CO2 25 24  GLUCOSE 83 75  BUN 12 13  CREATININE 0.83 0.83  CALCIUM 9.5 9.0    Liver function tests:  Recent Labs  01/06/13 0245  AST 16  ALT 11  ALKPHOS 56  BILITOT 0.5  PROT 6.6  ALBUMIN 3.6   No results found for this basename: LIPASE, AMYLASE,  in the last 72 hours  CBC:  Recent Labs  01/05/13 1135 01/06/13 0245  WBC 6.5 8.8  HGB 13.6 11.9*  HCT 38.2 33.3*  MCV 87.2 88.1  PLT 310 278    Cardiac Enzymes:  Recent Labs  01/05/13 1535 01/05/13 2022 01/06/13 0244    TROPONINI <0.30 0.46* 0.42*    Coagulation Studies:  Recent Labs  01/05/13 1230  LABPROT 12.5  INR 0.94    Other: No components found with this basename: POCBNP,  No results found for this basename: DDIMER,  in the last 72 hours No results found for this basename: HGBA1C,  in the last 72 hours  Recent Labs  01/06/13 0245  CHOL 229*  HDL 37*  LDLCALC 164*  TRIG 140  CHOLHDL 6.2     Other results:  Tele:  NSR  Medications:    Infusions: . heparin 700 Units/hr (01/06/13 0342)  . nitroGLYCERIN 35 mcg/min (01/06/13 0342)    Scheduled Medications: . artificial tears   Both Eyes QHS  . aspirin EC  81 mg Oral Daily  . atorvastatin  80 mg Oral q1800  . exenatide  10 mcg Subcutaneous Daily  . insulin aspart  0-15 Units Subcutaneous TID WC  . insulin glargine  35 Units Subcutaneous QHS  . levothyroxine  88 mcg Oral QAC breakfast  . LORazepam  2.5 mg Oral QHS  . metoprolol tartrate  12.5 mg Oral BID  .  pantoprazole  40 mg Oral Q1400    Assessment/ Plan:    1. CAD / NSTEMI:  She has 3 V cad.  TCTS has been consulted.   She is stable.  Continue heparin. 2. DM:   3. Hypothyroidism: 4.    Disposition: for TCTS consult, awaiting CABG. Length of Stay: 1  Vesta Mixer, Montez Hageman., MD, [facility] 01/06/2013, 7:32 AM Office [phone] Pager [phone]

## 2013-01-07 ENCOUNTER — Inpatient Hospital Stay (HOSPITAL_COMMUNITY): Payer: Medicare Other

## 2013-01-07 ENCOUNTER — Inpatient Hospital Stay (HOSPITAL_COMMUNITY): Payer: Medicare Other | Admitting: Anesthesiology

## 2013-01-07 ENCOUNTER — Encounter (HOSPITAL_COMMUNITY): Payer: Self-pay | Admitting: Anesthesiology

## 2013-01-07 ENCOUNTER — Encounter (HOSPITAL_COMMUNITY)
Admission: EM | Disposition: A | Discharge status: Home Health | Payer: Self-pay | Source: Home / Self Care | Attending: Cardiovascular Disease

## 2013-01-07 DIAGNOSIS — K219 Gastro-esophageal reflux disease without esophagitis: Secondary | ICD-10-CM | POA: Diagnosis not present

## 2013-01-07 DIAGNOSIS — J9819 Other pulmonary collapse: Secondary | ICD-10-CM | POA: Diagnosis not present

## 2013-01-07 DIAGNOSIS — E1169 Type 2 diabetes mellitus with other specified complication: Secondary | ICD-10-CM | POA: Diagnosis not present

## 2013-01-07 DIAGNOSIS — I214 Non-ST elevation (NSTEMI) myocardial infarction: Secondary | ICD-10-CM | POA: Diagnosis not present

## 2013-01-07 DIAGNOSIS — J90 Pleural effusion, not elsewhere classified: Secondary | ICD-10-CM | POA: Diagnosis not present

## 2013-01-07 DIAGNOSIS — I200 Unstable angina: Secondary | ICD-10-CM | POA: Diagnosis not present

## 2013-01-07 DIAGNOSIS — I2510 Atherosclerotic heart disease of native coronary artery without angina pectoris: Secondary | ICD-10-CM

## 2013-01-07 DIAGNOSIS — E8779 Other fluid overload: Secondary | ICD-10-CM | POA: Diagnosis not present

## 2013-01-07 HISTORY — PX: CORONARY ARTERY BYPASS GRAFT: SHX141

## 2013-01-07 LAB — PROTIME-INR
INR: 1.42 (ref 0.00–1.49)
Prothrombin Time: 17 seconds — ABNORMAL HIGH (ref 11.6–15.2)

## 2013-01-07 LAB — COMPREHENSIVE METABOLIC PANEL
AST: 13 U/L (ref 0–37)
BUN: 16 mg/dL (ref 6–23)
CO2: 23 mEq/L (ref 19–32)
Calcium: 8.8 mg/dL (ref 8.4–10.5)
Chloride: 99 mEq/L (ref 96–112)
Creatinine, Ser: 0.9 mg/dL (ref 0.50–1.10)
GFR calc Af Amer: 73 mL/min — ABNORMAL LOW (ref >90)
GFR calc non Af Amer: 63 mL/min — ABNORMAL LOW (ref >90)
Glucose, Bld: 197 mg/dL — ABNORMAL HIGH (ref 70–99)
Total Bilirubin: 0.7 mg/dL (ref 0.3–1.2)

## 2013-01-07 LAB — POCT I-STAT 3, ART BLOOD GAS (G3+)
Acid-base deficit: 4 mmol/L — ABNORMAL HIGH (ref 0.0–2.0)
Bicarbonate: 23.4 mEq/L (ref 20.0–24.0)
Bicarbonate: 20.9 mEq/L (ref 20.0–24.0)
O2 Saturation: 99 %
Patient temperature: 37.2
TCO2: 25 mmol/L (ref 0–100)
TCO2: 22 mmol/L (ref 0–100)
TCO2: 22 mmol/L (ref 0–100)
pCO2 arterial: 38 mmHg (ref 35.0–45.0)
pCO2 arterial: 38.7 mmHg (ref 35.0–45.0)
pCO2 arterial: 43.5 mmHg (ref 35.0–45.0)
pH, Arterial: 7.396 (ref 7.350–7.450)
pH, Arterial: 7.291 — ABNORMAL LOW (ref 7.350–7.450)
pO2, Arterial: 228 mmHg — ABNORMAL HIGH (ref 80.0–100.0)

## 2013-01-07 LAB — POCT I-STAT 4, (NA,K, GLUC, HGB,HCT)
Glucose, Bld: 161 mg/dL — ABNORMAL HIGH (ref 70–99)
Glucose, Bld: 97 mg/dL (ref 70–99)
Glucose, Bld: 107 mg/dL — ABNORMAL HIGH (ref 70–99)
Glucose, Bld: 111 mg/dL — ABNORMAL HIGH (ref 70–99)
HCT: 27 % — ABNORMAL LOW (ref 36.0–46.0)
HCT: 24 % — ABNORMAL LOW (ref 36.0–46.0)
HCT: 25 % — ABNORMAL LOW (ref 36.0–46.0)
Hemoglobin: 9.2 g/dL — ABNORMAL LOW (ref 12.0–15.0)
Hemoglobin: 8.2 g/dL — ABNORMAL LOW (ref 12.0–15.0)
Hemoglobin: 8.5 g/dL — ABNORMAL LOW (ref 12.0–15.0)
Potassium: 3.5 meq/L (ref 3.5–5.1)
Potassium: 3.2 mEq/L — ABNORMAL LOW (ref 3.5–5.1)
Sodium: 137 meq/L (ref 135–145)

## 2013-01-07 LAB — POCT I-STAT, CHEM 8
Chloride: 106 mEq/L (ref 96–112)
Creatinine, Ser: 0.7 mg/dL (ref 0.50–1.10)
HCT: 28 % — ABNORMAL LOW (ref 36.0–46.0)
Hemoglobin: 9.5 g/dL — ABNORMAL LOW (ref 12.0–15.0)
Potassium: 4.3 mEq/L (ref 3.5–5.1)
Sodium: 137 mEq/L (ref 135–145)

## 2013-01-07 LAB — CBC
HCT: 30.2 % — ABNORMAL LOW (ref 36.0–46.0)
HCT: 29.6 % — ABNORMAL LOW (ref 36.0–46.0)
HCT: 28.5 % — ABNORMAL LOW (ref 36.0–46.0)
Hemoglobin: 10.5 g/dL — ABNORMAL LOW (ref 12.0–15.0)
Hemoglobin: 10.6 g/dL — ABNORMAL LOW (ref 12.0–15.0)
MCH: 30.8 pg (ref 26.0–34.0)
MCH: 31.3 pg (ref 26.0–34.0)
MCV: 88.6 fL (ref 78.0–100.0)
MCV: 86.6 fL (ref 78.0–100.0)
Platelets: 117 10*3/uL — ABNORMAL LOW (ref 150–400)
RBC: 3.41 MIL/uL — ABNORMAL LOW (ref 3.87–5.11)
RBC: 3.29 MIL/uL — ABNORMAL LOW (ref 3.87–5.11)
RDW: 12.7 % (ref 11.5–15.5)
WBC: 12.2 10*3/uL — ABNORMAL HIGH (ref 4.0–10.5)
WBC: 14.3 10*3/uL — ABNORMAL HIGH (ref 4.0–10.5)
WBC: 10.3 10*3/uL (ref 4.0–10.5)

## 2013-01-07 LAB — POCT I-STAT GLUCOSE
Glucose, Bld: 143 mg/dL — ABNORMAL HIGH (ref 70–99)
Operator id: 117071

## 2013-01-07 LAB — HEMOGLOBIN AND HEMATOCRIT, BLOOD: Hemoglobin: 8.6 g/dL — ABNORMAL LOW (ref 12.0–15.0)

## 2013-01-07 LAB — CREATININE, SERUM: GFR calc non Af Amer: 89 mL/min — ABNORMAL LOW (ref >90)

## 2013-01-07 LAB — HEMOGLOBIN A1C: Hgb A1c MFr Bld: 6.8 % — ABNORMAL HIGH (ref <5.7)

## 2013-01-07 LAB — GLUCOSE, CAPILLARY
Glucose-Capillary: 87 mg/dL (ref 70–99)
Glucose-Capillary: 123 mg/dL — ABNORMAL HIGH (ref 70–99)

## 2013-01-07 LAB — SURGICAL PCR SCREEN
MRSA, PCR: NEGATIVE
Staphylococcus aureus: NEGATIVE

## 2013-01-07 LAB — APTT: aPTT: 33 seconds (ref 24–37)

## 2013-01-07 SURGERY — CORONARY ARTERY BYPASS GRAFTING (CABG)
Anesthesia: General | Site: Chest | Wound class: Clean

## 2013-01-07 MED ORDER — DOCUSATE SODIUM 100 MG PO CAPS
200.0000 mg | ORAL_CAPSULE | Freq: Every day | ORAL | Status: DC
  Administered 2013-01-08 – 2013-01-09 (×2): 200 mg via ORAL
  Filled 2013-01-07 (×3): qty 2

## 2013-01-07 MED ORDER — OXYCODONE HCL 5 MG PO TABS
5.0000 mg | ORAL_TABLET | ORAL | Status: DC | PRN
  Administered 2013-01-08 (×2): 5 mg via ORAL
  Administered 2013-01-09: 10 mg via ORAL
  Filled 2013-01-07: qty 2
  Filled 2013-01-07 (×3): qty 1

## 2013-01-07 MED ORDER — ONDANSETRON HCL 4 MG/2ML IJ SOLN
4.0000 mg | Freq: Four times a day (QID) | INTRAMUSCULAR | Status: DC | PRN
  Administered 2013-01-07 – 2013-01-08 (×2): 4 mg via INTRAVENOUS
  Filled 2013-01-07 (×2): qty 2

## 2013-01-07 MED ORDER — POTASSIUM CHLORIDE 10 MEQ/50ML IV SOLN: 10.0000 meq | INTRAVENOUS | Status: AC

## 2013-01-07 MED ORDER — ALBUMIN HUMAN 5 % IV SOLN
250.0000 mL | INTRAVENOUS | Status: AC | PRN
  Administered 2013-01-07 (×4): 250 mL via INTRAVENOUS
  Filled 2013-01-07: qty 500

## 2013-01-07 MED ORDER — MORPHINE SULFATE 2 MG/ML IJ SOLN: 1.0000 mg | INTRAMUSCULAR | Status: AC | PRN

## 2013-01-07 MED ORDER — CEFUROXIME SODIUM 750 MG IJ SOLR
INTRAMUSCULAR | Status: DC | PRN
  Administered 2013-01-07: .75 g via INTRAMUSCULAR

## 2013-01-07 MED ORDER — MAGNESIUM SULFATE 40 MG/ML IJ SOLN
4.0000 g | Freq: Once | INTRAMUSCULAR | Status: AC
  Administered 2013-01-07: 4 g via INTRAVENOUS
  Filled 2013-01-07: qty 100

## 2013-01-07 MED ORDER — LIDOCAINE HCL (CARDIAC) 20 MG/ML IV SOLN
INTRAVENOUS | Status: DC | PRN
  Administered 2013-01-07: 40 mg via INTRAVENOUS

## 2013-01-07 MED ORDER — PANTOPRAZOLE SODIUM 40 MG PO TBEC
40.0000 mg | DELAYED_RELEASE_TABLET | Freq: Every day | ORAL | Status: DC
  Administered 2013-01-09 – 2013-01-12 (×4): 40 mg via ORAL
  Filled 2013-01-07 (×4): qty 1

## 2013-01-07 MED ORDER — LEVOTHYROXINE SODIUM 88 MCG PO TABS
88.0000 ug | ORAL_TABLET | Freq: Every day | ORAL | Status: DC
  Administered 2013-01-09 – 2013-01-12 (×4): 88 ug via ORAL
  Filled 2013-01-07 (×6): qty 1

## 2013-01-07 MED ORDER — METOPROLOL TARTRATE 25 MG/10 ML ORAL SUSPENSION
12.5000 mg | Freq: Two times a day (BID) | ORAL | Status: DC
  Administered 2013-01-08: 12.5 mg
  Filled 2013-01-07 (×7): qty 5

## 2013-01-07 MED ORDER — PROTAMINE SULFATE 10 MG/ML IV SOLN
INTRAVENOUS | Status: DC | PRN
  Administered 2013-01-07: 200 mg via INTRAVENOUS

## 2013-01-07 MED ORDER — FAMOTIDINE IN NACL 20-0.9 MG/50ML-% IV SOLN
20.0000 mg | Freq: Two times a day (BID) | INTRAVENOUS | Status: AC
  Administered 2013-01-07: 20 mg via INTRAVENOUS

## 2013-01-07 MED ORDER — LACTATED RINGERS IV SOLN
INTRAVENOUS | Status: DC | PRN
  Administered 2013-01-07: 07:00:00 via INTRAVENOUS

## 2013-01-07 MED ORDER — HEMOSTATIC AGENTS (NO CHARGE) OPTIME
TOPICAL | Status: DC | PRN
  Administered 2013-01-07: 1 via TOPICAL

## 2013-01-07 MED ORDER — MORPHINE SULFATE 2 MG/ML IJ SOLN
2.0000 mg | INTRAMUSCULAR | Status: DC | PRN
  Administered 2013-01-07 – 2013-01-08 (×3): 2 mg via INTRAVENOUS
  Filled 2013-01-07 (×4): qty 1

## 2013-01-07 MED ORDER — PROPOFOL 10 MG/ML IV BOLUS
INTRAVENOUS | Status: DC | PRN
  Administered 2013-01-07: 80 mg via INTRAVENOUS

## 2013-01-07 MED ORDER — SODIUM CHLORIDE 0.9 % IV SOLN: 250.0000 mL | INTRAVENOUS | Status: DC

## 2013-01-07 MED ORDER — BISACODYL 10 MG RE SUPP: 10.0000 mg | Freq: Every day | RECTAL | Status: DC

## 2013-01-07 MED ORDER — NITROGLYCERIN IN D5W 200-5 MCG/ML-% IV SOLN: 2.0000 ug/min | INTRAVENOUS | Status: DC

## 2013-01-07 MED ORDER — MANNITOL 25 % IV SOLN: 25.0000 g | Freq: Once | INTRAVENOUS | Status: DC

## 2013-01-07 MED ORDER — SODIUM CHLORIDE 0.9 % IV SOLN: 10.0000 g | INTRAVENOUS | Status: DC | PRN

## 2013-01-07 MED ORDER — ACETAMINOPHEN 10 MG/ML IV SOLN
1000.0000 mg | Freq: Once | INTRAVENOUS | Status: AC
  Administered 2013-01-07: 1000 mg via INTRAVENOUS
  Filled 2013-01-07: qty 100

## 2013-01-07 MED ORDER — DEXMEDETOMIDINE HCL IN NACL 200 MCG/50ML IV SOLN: 0.1000 ug/kg/h | INTRAVENOUS | Status: DC

## 2013-01-07 MED ORDER — BISACODYL 5 MG PO TBEC
10.0000 mg | DELAYED_RELEASE_TABLET | Freq: Every day | ORAL | Status: DC
  Administered 2013-01-08 – 2013-01-10 (×3): 10 mg via ORAL
  Filled 2013-01-07 (×3): qty 2

## 2013-01-07 MED ORDER — LACTATED RINGERS IV SOLN
INTRAVENOUS | Status: DC | PRN
  Administered 2013-01-07 (×2): via INTRAVENOUS

## 2013-01-07 MED ORDER — ASPIRIN EC 325 MG PO TBEC
325.0000 mg | DELAYED_RELEASE_TABLET | Freq: Every day | ORAL | Status: DC
  Administered 2013-01-08 – 2013-01-10 (×3): 325 mg via ORAL
  Filled 2013-01-07 (×4): qty 1

## 2013-01-07 MED ORDER — PHENYLEPHRINE HCL 10 MG/ML IJ SOLN: 0.0000 ug/min | INTRAVENOUS | Status: DC

## 2013-01-07 MED ORDER — SODIUM CHLORIDE 0.9 % IV SOLN
10.0000 g | INTRAVENOUS | Status: DC | PRN
  Administered 2013-01-07: 5 g/h via INTRAVENOUS

## 2013-01-07 MED ORDER — DEXTROSE 5 % IV SOLN
1.5000 g | Freq: Two times a day (BID) | INTRAVENOUS | Status: AC
  Administered 2013-01-07 – 2013-01-09 (×4): 1.5 g via INTRAVENOUS
  Filled 2013-01-07 (×4): qty 1.5

## 2013-01-07 MED ORDER — SODIUM CHLORIDE 0.9 % IV SOLN: INTRAVENOUS | Status: DC

## 2013-01-07 MED ORDER — SODIUM CHLORIDE 0.9 % IJ SOLN
3.0000 mL | Freq: Two times a day (BID) | INTRAMUSCULAR | Status: DC
  Administered 2013-01-08 (×2): 3 mL via INTRAVENOUS

## 2013-01-07 MED ORDER — SODIUM CHLORIDE 0.9 % IJ SOLN: 3.0000 mL | INTRAMUSCULAR | Status: DC | PRN

## 2013-01-07 MED ORDER — VANCOMYCIN HCL IN DEXTROSE 1-5 GM/200ML-% IV SOLN
1000.0000 mg | Freq: Once | INTRAVENOUS | Status: AC
  Administered 2013-01-07: 1000 mg via INTRAVENOUS
  Filled 2013-01-07: qty 200

## 2013-01-07 MED ORDER — LACTATED RINGERS IV SOLN: 500.0000 mL | Freq: Once | INTRAVENOUS | Status: AC | PRN

## 2013-01-07 MED ORDER — NITROGLYCERIN IN D5W 200-5 MCG/ML-% IV SOLN: 0.0000 ug/min | INTRAVENOUS | Status: DC

## 2013-01-07 MED ORDER — INSULIN REGULAR BOLUS VIA INFUSION
0.0000 [IU] | Freq: Three times a day (TID) | INTRAVENOUS | Status: DC
  Administered 2013-01-08: 4 [IU] via INTRAVENOUS
  Administered 2013-01-08: 3 [IU] via INTRAVENOUS
  Filled 2013-01-07: qty 10

## 2013-01-07 MED ORDER — FENTANYL CITRATE 0.05 MG/ML IJ SOLN
INTRAMUSCULAR | Status: DC | PRN
  Administered 2013-01-07: 500 ug via INTRAVENOUS
  Administered 2013-01-07: 150 ug via INTRAVENOUS
  Administered 2013-01-07 (×2): 250 ug via INTRAVENOUS
  Administered 2013-01-07 (×2): 50 ug via INTRAVENOUS

## 2013-01-07 MED ORDER — ACETAMINOPHEN 160 MG/5ML PO SOLN
975.0000 mg | Freq: Four times a day (QID) | ORAL | Status: DC
  Administered 2013-01-08 – 2013-01-09 (×5): 975 mg
  Filled 2013-01-07 (×5): qty 20.3

## 2013-01-07 MED ORDER — SODIUM CHLORIDE 0.45 % IV SOLN
INTRAVENOUS | Status: DC
  Administered 2013-01-07: 20 mL/h via INTRAVENOUS

## 2013-01-07 MED ORDER — ATORVASTATIN CALCIUM 80 MG PO TABS
80.0000 mg | ORAL_TABLET | Freq: Every day | ORAL | Status: DC
  Administered 2013-01-08 – 2013-01-11 (×4): 80 mg via ORAL
  Filled 2013-01-07 (×5): qty 1

## 2013-01-07 MED ORDER — GELATIN ABSORBABLE MT POWD
OROMUCOSAL | Status: DC | PRN
  Administered 2013-01-07 (×3): via TOPICAL

## 2013-01-07 MED ORDER — METOPROLOL TARTRATE 1 MG/ML IV SOLN: 2.5000 mg | INTRAVENOUS | Status: DC | PRN

## 2013-01-07 MED ORDER — HEPARIN SODIUM (PORCINE) 1000 UNIT/ML IJ SOLN
INTRAMUSCULAR | Status: DC | PRN
  Administered 2013-01-07: 23000 [IU] via INTRAVENOUS
  Administered 2013-01-07: 2000 [IU] via INTRAVENOUS

## 2013-01-07 MED ORDER — ACETAMINOPHEN 500 MG PO TABS
1000.0000 mg | ORAL_TABLET | Freq: Four times a day (QID) | ORAL | Status: DC
  Administered 2013-01-08 – 2013-01-11 (×9): 1000 mg via ORAL
  Filled 2013-01-07 (×18): qty 2

## 2013-01-07 MED ORDER — MIDAZOLAM HCL 2 MG/2ML IJ SOLN: 2.0000 mg | INTRAMUSCULAR | Status: DC | PRN

## 2013-01-07 MED ORDER — LACTATED RINGERS IV SOLN: INTRAVENOUS | Status: DC

## 2013-01-07 MED ORDER — CALCIUM CHLORIDE 10 % IV SOLN
INTRAVENOUS | Status: DC | PRN
  Administered 2013-01-07 (×3): .1 g via INTRAVENOUS

## 2013-01-07 MED ORDER — METOPROLOL TARTRATE 12.5 MG HALF TABLET
12.5000 mg | ORAL_TABLET | Freq: Two times a day (BID) | ORAL | Status: DC
  Administered 2013-01-08 – 2013-01-10 (×3): 12.5 mg via ORAL
  Filled 2013-01-07 (×7): qty 1

## 2013-01-07 MED ORDER — ASPIRIN 81 MG PO CHEW: 324.0000 mg | CHEWABLE_TABLET | Freq: Every day | ORAL | Status: DC

## 2013-01-07 MED ORDER — MIDAZOLAM HCL 5 MG/ML IJ SOLN
INTRAMUSCULAR | Status: DC | PRN
  Administered 2013-01-07: 3 mg via INTRAVENOUS
  Administered 2013-01-07: 2 mg via INTRAVENOUS
  Administered 2013-01-07: 3 mg via INTRAVENOUS
  Administered 2013-01-07 (×2): 1 mg via INTRAVENOUS

## 2013-01-07 MED ORDER — 0.9 % SODIUM CHLORIDE (POUR BTL) OPTIME
TOPICAL | Status: DC | PRN
  Administered 2013-01-07: 6000 mL

## 2013-01-07 MED ORDER — ROCURONIUM BROMIDE 100 MG/10ML IV SOLN
INTRAVENOUS | Status: DC | PRN
  Administered 2013-01-07: 60 mg via INTRAVENOUS

## 2013-01-07 SURGICAL SUPPLY — 88 items
ATTRACTOMAT 16X20 MAGNETIC DRP (DRAPES) ×2 IMPLANT
BAG DECANTER FOR FLEXI CONT (MISCELLANEOUS) ×2 IMPLANT
BANDAGE ELASTIC 4 VELCRO ST LF (GAUZE/BANDAGES/DRESSINGS) ×3 IMPLANT
BANDAGE ELASTIC 6 VELCRO ST LF (GAUZE/BANDAGES/DRESSINGS) ×3 IMPLANT
BANDAGE GAUZE ELAST BULKY 4 IN (GAUZE/BANDAGES/DRESSINGS) ×3 IMPLANT
BASKET HEART (ORDER IN 25'S) (MISCELLANEOUS) ×1
BASKET HEART (ORDER IN 25S) (MISCELLANEOUS) ×1 IMPLANT
BLADE STERNUM SYSTEM 6 (BLADE) ×2 IMPLANT
CANISTER SUCTION 2500CC (MISCELLANEOUS) ×2 IMPLANT
CATH CPB KIT HENDRICKSON (MISCELLANEOUS) ×2 IMPLANT
CATH ROBINSON RED A/P 18FR (CATHETERS) ×2 IMPLANT
CATH THORACIC 36FR (CATHETERS) ×2 IMPLANT
CATH THORACIC 36FR RT ANG (CATHETERS) ×2 IMPLANT
CLIP TI MEDIUM 24 (CLIP) IMPLANT
CLIP TI WIDE RED SMALL 24 (CLIP) ×3 IMPLANT
CLOTH BEACON ORANGE TIMEOUT ST (SAFETY) ×2 IMPLANT
COVER SURGICAL LIGHT HANDLE (MISCELLANEOUS) ×2 IMPLANT
CRADLE DONUT ADULT HEAD (MISCELLANEOUS) ×2 IMPLANT
DRAPE CARDIOVASCULAR INCISE (DRAPES) ×2
DRAPE SLUSH/WARMER DISC (DRAPES) ×2 IMPLANT
DRAPE SRG 135X102X78XABS (DRAPES) ×1 IMPLANT
DRSG COVADERM 4X14 (GAUZE/BANDAGES/DRESSINGS) ×2 IMPLANT
ELECT REM PT RETURN 9FT ADLT (ELECTROSURGICAL) ×4
ELECTRODE REM PT RTRN 9FT ADLT (ELECTROSURGICAL) ×2 IMPLANT
GLOVE BIO SURGEON STRL SZ 6 (GLOVE) ×2 IMPLANT
GLOVE BIO SURGEON STRL SZ 6.5 (GLOVE) ×2 IMPLANT
GLOVE BIO SURGEON STRL SZ7 (GLOVE) ×1 IMPLANT
GLOVE BIO SURGEON STRL SZ7.5 (GLOVE) ×2 IMPLANT
GLOVE BIOGEL PI IND STRL 6.5 (GLOVE) IMPLANT
GLOVE BIOGEL PI IND STRL 7.0 (GLOVE) IMPLANT
GLOVE BIOGEL PI INDICATOR 6.5 (GLOVE) ×2
GLOVE BIOGEL PI INDICATOR 7.0 (GLOVE) ×2
GLOVE EUDERMIC 7 POWDERFREE (GLOVE) ×6 IMPLANT
GOWN PREVENTION PLUS XLARGE (GOWN DISPOSABLE) ×6 IMPLANT
GOWN STRL NON-REIN LRG LVL3 (GOWN DISPOSABLE) ×9 IMPLANT
HEMOSTAT POWDER SURGIFOAM 1G (HEMOSTASIS) ×6 IMPLANT
HEMOSTAT SURGICEL 2X14 (HEMOSTASIS) ×2 IMPLANT
INSERT FOGARTY XLG (MISCELLANEOUS) IMPLANT
KIT BASIN OR (CUSTOM PROCEDURE TRAY) ×2 IMPLANT
KIT ROOM TURNOVER OR (KITS) ×2 IMPLANT
KIT SUCTION CATH 14FR (SUCTIONS) ×4 IMPLANT
KIT VASOVIEW W/TROCAR VH 2000 (KITS) ×2 IMPLANT
LOOP VESSEL MAXI BLUE (MISCELLANEOUS) ×1 IMPLANT
MARKER GRAFT CORONARY BYPASS (MISCELLANEOUS) ×6 IMPLANT
NS IRRIG 1000ML POUR BTL (IV SOLUTION) ×10 IMPLANT
PACK OPEN HEART (CUSTOM PROCEDURE TRAY) ×2 IMPLANT
PAD ARMBOARD 7.5X6 YLW CONV (MISCELLANEOUS) ×4 IMPLANT
PAD DEFIB R2 (MISCELLANEOUS) ×1 IMPLANT
PAD ELECT DEFIB RADIOL ZOLL (MISCELLANEOUS) ×2 IMPLANT
PENCIL BUTTON HOLSTER BLD 10FT (ELECTRODE) ×2 IMPLANT
PUNCH AORTIC ROTATE 4.0MM (MISCELLANEOUS) ×1 IMPLANT
PUNCH AORTIC ROTATE 4.5MM 8IN (MISCELLANEOUS) IMPLANT
PUNCH AORTIC ROTATE 5MM 8IN (MISCELLANEOUS) IMPLANT
SPONGE GAUZE 4X4 12PLY (GAUZE/BANDAGES/DRESSINGS) ×5 IMPLANT
SUT BONE WAX W31G (SUTURE) ×2 IMPLANT
SUT MNCRL AB 4-0 PS2 18 (SUTURE) IMPLANT
SUT PROLENE 3 0 SH DA (SUTURE) ×2 IMPLANT
SUT PROLENE 4 0 RB 1 (SUTURE)
SUT PROLENE 4 0 SH DA (SUTURE) IMPLANT
SUT PROLENE 4-0 RB1 .5 CRCL 36 (SUTURE) IMPLANT
SUT PROLENE 6 0 C 1 30 (SUTURE) ×4 IMPLANT
SUT PROLENE 7 0 BV 1 (SUTURE) ×1 IMPLANT
SUT PROLENE 7 0 BV1 MDA (SUTURE) ×2 IMPLANT
SUT PROLENE 8 0 BV175 6 (SUTURE) ×2 IMPLANT
SUT SILK  1 MH (SUTURE) ×1
SUT SILK 1 MH (SUTURE) IMPLANT
SUT STEEL 6MS V (SUTURE) ×1 IMPLANT
SUT STEEL STERNAL CCS#1 18IN (SUTURE) IMPLANT
SUT STEEL SZ 6 DBL 3X14 BALL (SUTURE) ×1 IMPLANT
SUT VIC AB 1 CTX 36 (SUTURE) ×4
SUT VIC AB 1 CTX36XBRD ANBCTR (SUTURE) ×2 IMPLANT
SUT VIC AB 2-0 CT1 27 (SUTURE) ×8
SUT VIC AB 2-0 CT1 TAPERPNT 27 (SUTURE) IMPLANT
SUT VIC AB 2-0 CTX 27 (SUTURE) IMPLANT
SUT VIC AB 3-0 SH 27 (SUTURE)
SUT VIC AB 3-0 SH 27X BRD (SUTURE) IMPLANT
SUT VIC AB 3-0 X1 27 (SUTURE) ×4 IMPLANT
SUT VICRYL 4-0 PS2 18IN ABS (SUTURE) IMPLANT
SUTURE E-PAK OPEN HEART (SUTURE) ×2 IMPLANT
SYSTEM SAHARA CHEST DRAIN ATS (WOUND CARE) ×2 IMPLANT
TAPE CLOTH SURG 4X10 WHT LF (GAUZE/BANDAGES/DRESSINGS) ×1 IMPLANT
TOWEL OR 17X24 6PK STRL BLUE (TOWEL DISPOSABLE) ×4 IMPLANT
TOWEL OR 17X26 10 PK STRL BLUE (TOWEL DISPOSABLE) ×4 IMPLANT
TRAY FOLEY IC TEMP SENS 14FR (CATHETERS) ×2 IMPLANT
TUBE FEEDING 8FR 16IN STR KANG (MISCELLANEOUS) ×2 IMPLANT
TUBING INSUFFLATION 10FT LAP (TUBING) ×2 IMPLANT
UNDERPAD 30X30 INCONTINENT (UNDERPADS AND DIAPERS) ×2 IMPLANT
WATER STERILE IRR 1000ML POUR (IV SOLUTION) ×4 IMPLANT

## 2013-01-07 NOTE — Transfer of Care (Signed)
Immediate Anesthesia Transfer of Care Note  Patient: Angela Barrett  Procedure(s) Performed: Procedure(s): CORONARY ARTERY BYPASS GRAFTING (CABG) (N/A)  Patient Location: SICU  Anesthesia Type:General  Level of Consciousness: unresponsive and Patient remains intubated per anesthesia plan  Airway & Oxygen Therapy: Patient remains intubated per anesthesia plan and Patient placed on Ventilator (see vital sign flow sheet for setting)  Post-op Assessment: Report given to PACU RN  Post vital signs: Reviewed and stable  Complications: No apparent anesthesia complications

## 2013-01-07 NOTE — Progress Notes (Signed)
INITIAL NUTRITION ASSESSMENT  DOCUMENTATION CODES Per approved criteria  -Not Applicable   INTERVENTION:  If EN warranted, recommend initiating Jevity 1.2 formula at 20 ml/hr and increase by 10 ml every 4 hours to goal rate of 40 ml/hr with Prostat liquid protein 30 ml twice daily to provide 1352 kcals, 83 gm protein, 775 ml of free water  Recommend liquid MVI daily via tube RD to follow for nutrition care plan  NUTRITION DIAGNOSIS: Inadequate oral intake related to inability to eat as evidenced by NPO status  Goal: Initiate EN support within next 24-48 hours if prolonged intubation expected  Monitor:  EN initiation, respiratory status, weight, labs, I/O's  Reason for Assessment: VDRF  72 y.o. female  Admitting Dx: coronary artery disease  ASSESSMENT: Patient with hx of chest pain and reportedly normal stress test about 8 yrs ago presented to [facility] ED with several month h/o DOE followed by 1 week h/o unstable angina culminating in rest angina and elevated troponins; s/p cardiac cath 6/4.  Patient s/p procedures 6/6: CORONARY BYPASS GRAFTING  Patient is currently intubated on ventilator support MV: 6.2 Temp: 37.4  Height: Ht Readings from Last 1 Encounters:  01/05/13 5\' 2"  (1.575 m)    Weight: Wt Readings from Last 1 Encounters:  01/07/13 135 lb 9.3 oz (61.5 kg)    Ideal Body Weight: 110 lb  % Ideal Body Weight: 122%  Wt Readings from Last 10 Encounters:  01/07/13 135 lb 9.3 oz (61.5 kg)  01/07/13 135 lb 9.3 oz (61.5 kg)  01/07/13 135 lb 9.3 oz (61.5 kg)    Usual Body Weight: unable to obtain  % Usual Body Weight: ---  BMI:  Body mass index is 24.79 kg/(m^2).  Estimated Nutritional Needs: Kcal: 1250-1400 Protein: 80-90 gm Fluid: per MD  Skin: chest & leg surgical incisions   Diet Order: NPO  EDUCATION NEEDS: -No education needs identified at this time   Intake/Output Summary (Last 24 hours) at 01/07/13 1557 Last data filed at 01/07/13 1530  Gross per 24 hour  Intake 3269.93 ml  Output   4600 ml  Net -1330.07 ml    Labs:   Recent Labs Lab 01/05/13 1135 01/06/13 0245 01/07/13 0435  01/07/13 1208 01/07/13 1259 01/07/13 1306  NA 141 139 132*  < > 136 143 132*  K 3.9 3.6 3.8  < > 5.3* 3.2* 4.9  CL 104 106 99  --   --   --   --   CO2 25 24 23   --   --   --   --   BUN 12 13 16   --   --   --   --   CREATININE 0.83 0.83 0.90  --   --   --   --   CALCIUM 9.5 9.0 8.8  --   --   --   --   GLUCOSE 83 75 197*  < > 107* 111* 159*  < > = values in this interval not displayed.  CBG (last 3)   Recent Labs  01/06/13 1724 01/06/13 2115 01/07/13 0539  GLUCAP 166* 193* 188*    Scheduled Meds: . [START ON 01/08/2013] acetaminophen  1,000 mg Oral Q6H   Or  . [START ON 01/08/2013] acetaminophen (TYLENOL) oral liquid 160 mg/5 mL  975 mg Per Tube Q6H  . artificial tears   Both Eyes QHS  . [START ON 01/08/2013] aspirin EC  325 mg Oral Daily   Or  . [START ON 01/08/2013] aspirin  324 mg Per Tube Daily  . [START ON 01/08/2013] atorvastatin  80 mg Oral q1800  . [START ON 01/08/2013] bisacodyl  10 mg Oral Daily   Or  . [START ON 01/08/2013] bisacodyl  10 mg Rectal Daily  . cefUROXime (ZINACEF)  IV  1.5 g Intravenous Q12H  . [START ON 01/08/2013] docusate sodium  200 mg Oral Daily  . famotidine (PEPCID) IV  20 mg Intravenous Q12H  . insulin regular  0-10 Units Intravenous TID WC  . [START ON 01/08/2013] levothyroxine  88 mcg Oral QAC breakfast  . magnesium sulfate  4 g Intravenous Once  . metoprolol tartrate  12.5 mg Oral BID   Or  . metoprolol tartrate  12.5 mg Per Tube BID  . [START ON 01/09/2013] pantoprazole  40 mg Oral Daily  . potassium chloride  10 mEq Intravenous Q1 Hr x 3  . [START ON 01/08/2013] sodium chloride  3 mL Intravenous Q12H  . vancomycin  1,000 mg Intravenous Once    Continuous Infusions: . sodium chloride 20 mL/hr (01/07/13 1400)  . sodium chloride 20 mL (01/07/13 1400)  . [START ON 01/08/2013] sodium chloride    .  dexmedetomidine 0.2 mcg/kg/hr (01/07/13 1530)  . insulin (NOVOLIN-R) infusion 1.4 Units/hr (01/07/13 1500)  . lactated ringers 20 mL/hr (01/07/13 1400)  . nitroGLYCERIN Stopped (01/07/13 1445)  . phenylephrine (NEO-SYNEPHRINE) Adult infusion 30 mcg/min (01/07/13 1500)    Past Medical History  Diagnosis Date  . Depression   . Hypothyroidism   . GERD (gastroesophageal reflux disease)   . Diabetes mellitus, type 2   . IBS (irritable bowel syndrome)   . OA (osteoarthritis)   . Lymphocytosis 03/24/2012  . PONV (postoperative nausea and vomiting)   . Chest pain     a. nl myoview ~ 2006.    Past Surgical History  Procedure Laterality Date  . Thyroidectomy  2010    Maureen Chatters, RD, LDN Pager #: [phone] After-Hours Pager #: [phone]

## 2013-01-07 NOTE — Procedures (Signed)
Extubation Procedure Note  Patient Details:   Name: Angela Barrett DOB: 11-15-1940 MRN: [id number]   Airway Documentation:     Evaluation  O2 sats: stable throughout and currently acceptable Complications: No apparent complications Patient did tolerate procedure well. Bilateral Breath Sounds: Clear   Yes Pt awake and alert. Extubated per protocol, placed on 3L [place] with water bottle, sat 100%. Positive cuff leak NIF -20, VC 600, IS 300. Pt able to vocalize.  Arloa Koh 01/07/2013, 6:56 PM

## 2013-01-07 NOTE — Progress Notes (Signed)
MD at bedside and aware of IStat lab results upon admission to unit.

## 2013-01-07 NOTE — Anesthesia Preprocedure Evaluation (Addendum)
Anesthesia Evaluation  Patient identified by MRN, date of birth, ID band Patient awake    Reviewed: Allergy & Precautions, H&P , NPO status , Patient's Chart, lab work & pertinent test results, reviewed documented beta blocker date and time   History of Anesthesia Complications (+) PONV  Airway Mallampati: I TM Distance: >3 FB Neck ROM: full    Dental  (+) Dental Advisory Given   Pulmonary          Cardiovascular + CAD Rhythm:regular Rate:Normal     Neuro/Psych Depression    GI/Hepatic GERD-  Medicated and Controlled,  Endo/Other  diabetes, Type 2, Insulin Dependent and Oral Hypoglycemic AgentsHypothyroidism   Renal/GU      Musculoskeletal   Abdominal   Peds  Hematology   Anesthesia Other Findings   Reproductive/Obstetrics                          Anesthesia Physical Anesthesia Plan  ASA: III  Anesthesia Plan: General   Post-op Pain Management:    Induction: Intravenous  Airway Management Planned: Oral ETT  Additional Equipment: Arterial line, CVP, PA Cath and TEE  Intra-op Plan:   Post-operative Plan: Post-operative intubation/ventilation  Informed Consent: I have reviewed the patients History and Physical, chart, labs and discussed the procedure including the risks, benefits and alternatives for the proposed anesthesia with the patient or authorized representative who has indicated his/her understanding and acceptance.     Plan Discussed with: CRNA, Anesthesiologist and Surgeon  Anesthesia Plan Comments:         Anesthesia Quick Evaluation

## 2013-01-07 NOTE — H&P (View-Only) (Signed)
Reason for Consult:3 vessel CAD Referring Physician: [name] is an 72 y.o. female.  HPI: 72 yo WF presented with a cc/o CP  Angela Barrett is a 72 yo woman with a history of hyperlipidemia and diabetes. No prior history of CAD. She did have a cardiac work up in 2006 which included a negative stress test. She began having chest pain about a week ago. She describes this as a pressure/ burning sensation with exertion. She would also get pain in her hands. Initially relieved with rest. Over the next 7 days she continued to have pain with almost any exertion. It became progressively more frequent and severe as the week went on. She initially thought it was reflux, but eventually the symptoms became so severe she went to the ED. Her initial troponins were positive. She ruled in for a NQWMI. Yesterday she had cardiac catheterization which revealed severe 3 vessel CAD. She is currently pain free. She does note that for the past 6-8 months she has been feeling very tired and run down.  Past Medical History  Diagnosis Date  . Depression   . Hypothyroidism   . GERD (gastroesophageal reflux disease)   . Diabetes mellitus, type 2   . IBS (irritable bowel syndrome)   . OA (osteoarthritis)   . Lymphocytosis 03/24/2012  . PONV (postoperative nausea and vomiting)   . Chest pain     a. nl myoview ~ 2006.    Past Surgical History  Procedure Laterality Date  . Thyroidectomy  2010    Family History  Problem Relation Age of Onset  . Cancer Mother     bone CA, died @ 85  . Emphysema Father     died @ 55  . Emphysema Brother     died @ 4  . Diabetes Son     alive and well.    Social History:  reports that she has never smoked. She has never used smokeless tobacco. She reports that she does not drink alcohol or use illicit drugs.  Allergies:  Allergies  Allergen Reactions  . Amitriptyline Other (See Comments)    disoriented  . Cortisone Other (See Comments)    Eyes hurt and sugar  goes up  . Macrodantin (Nitrofurantoin) Itching and Swelling  . Motrin (Ibuprofen) Other (See Comments)    Fluid retention    Medications:  Prior to Admission:  Prescriptions prior to admission  Medication Sig Dispense Refill  . Artificial Tear Ointment (ARTIFICIAL TEARS) ointment Place 1 application into both eyes at bedtime.      Marland Kitchen aspirin EC 81 MG tablet Take 81 mg by mouth 2 (two) times daily.      Marland Kitchen exenatide (BYETTA) 10 MCG/0.04ML SOPN Inject 10 mcg into the skin daily.      Marland Kitchen glucosamine-chondroitin 500-400 MG tablet Take 1 tablet by mouth every morning.      . insulin glargine (LANTUS) 100 UNIT/ML injection Inject 35 Units into the skin at bedtime.      Marland Kitchen ketotifen (THERA TEARS ALLERGY) 0.025 % ophthalmic solution Place 1 drop into both eyes as needed (for allergy eyes.).      Marland Kitchen levothyroxine (SYNTHROID, LEVOTHROID) 88 MCG tablet Take 88 mcg by mouth daily before breakfast.      . LORazepam (ATIVAN) 1 MG tablet Take 2.5 mg by mouth at bedtime.      . metFORMIN (GLUCOPHAGE-XR) 500 MG 24 hr tablet Take 500 mg by mouth daily with breakfast.      .  pantoprazole (PROTONIX) 40 MG tablet Take 40 mg by mouth daily at 2 PM daily at 2 PM.        Results for orders placed during the hospital encounter of 01/05/13 (from the past 48 hour(s))  GLUCOSE, CAPILLARY     Status: None   Collection Time    01/05/13 11:28 AM      Result Value Range   Glucose-Capillary 82  70 - 99 mg/dL  CBC     Status: None   Collection Time    01/05/13 11:35 AM      Result Value Range   WBC 6.5  4.0 - 10.5 K/uL   RBC 4.38  3.87 - 5.11 MIL/uL   Hemoglobin 13.6  12.0 - 15.0 g/dL   HCT 78.2  95.6 - 21.3 %   MCV 87.2  78.0 - 100.0 fL   MCH 31.1  26.0 - 34.0 pg   MCHC 35.6  30.0 - 36.0 g/dL   RDW 08.6  57.8 - 46.9 %   Platelets 310  150 - 400 K/uL  BASIC METABOLIC PANEL     Status: Abnormal   Collection Time    01/05/13 11:35 AM      Result Value Range   Sodium 141  135 - 145 mEq/L   Potassium 3.9  3.5 -  5.1 mEq/L   Chloride 104  96 - 112 mEq/L   CO2 25  19 - 32 mEq/L   Glucose, Bld 83  70 - 99 mg/dL   BUN 12  6 - 23 mg/dL   Creatinine, Ser 6.29  0.50 - 1.10 mg/dL   Calcium 9.5  8.4 - 52.8 mg/dL   GFR calc non Af Amer 69 (*) >90 mL/min   GFR calc Af Amer 80 (*) >90 mL/min   Comment:            The eGFR has been calculated     using the CKD EPI equation.     This calculation has not been     validated in all clinical     situations.     eGFR's persistently     <90 mL/min signify     possible Chronic Kidney Disease.  PRO B NATRIURETIC PEPTIDE     Status: Abnormal   Collection Time    01/05/13 11:35 AM      Result Value Range   Pro B Natriuretic peptide (BNP) 986.9 (*) 0 - 125 pg/mL  POCT I-STAT TROPONIN I     Status: Abnormal   Collection Time    01/05/13 11:56 AM      Result Value Range   Troponin i, poc 0.16 (*) 0.00 - 0.08 ng/mL   Comment NOTIFIED PHYSICIAN     Comment 3            Comment: Due to the release kinetics of cTnI,     a negative result within the first hours     of the onset of symptoms does not rule out     myocardial infarction with certainty.     If myocardial infarction is still suspected,     repeat the test at appropriate intervals.  APTT     Status: None   Collection Time    01/05/13 12:30 PM      Result Value Range   aPTT 29  24 - 37 seconds  PROTIME-INR     Status: None   Collection Time    01/05/13 12:30 PM  Result Value Range   Prothrombin Time 12.5  11.6 - 15.2 seconds   INR 0.94  0.00 - 1.49  MRSA PCR SCREENING     Status: None   Collection Time    01/05/13  2:23 PM      Result Value Range   MRSA by PCR NEGATIVE  NEGATIVE   Comment:            The GeneXpert MRSA Assay (FDA     approved for NASAL specimens     only), is one component of a     comprehensive MRSA colonization     surveillance program. It is not     intended to diagnose MRSA     infection nor to guide or     monitor treatment for     MRSA infections.  TROPONIN I      Status: None   Collection Time    01/05/13  3:35 PM      Result Value Range   Troponin I <0.30  <0.30 ng/mL   Comment:            Due to the release kinetics of cTnI,     a negative result within the first hours     of the onset of symptoms does not rule out     myocardial infarction with certainty.     If myocardial infarction is still suspected,     repeat the test at appropriate intervals.  GLUCOSE, CAPILLARY     Status: Abnormal   Collection Time    01/05/13  4:30 PM      Result Value Range   Glucose-Capillary 103 (*) 70 - 99 mg/dL  TROPONIN I     Status: Abnormal   Collection Time    01/05/13  8:22 PM      Result Value Range   Troponin I 0.46 (*) <0.30 ng/mL   Comment:            Due to the release kinetics of cTnI,     a negative result within the first hours     of the onset of symptoms does not rule out     myocardial infarction with certainty.     If myocardial infarction is still suspected,     repeat the test at appropriate intervals.     CRITICAL RESULT CALLED TO, READ BACK BY AND VERIFIED WITH:     H CHURCH,RN 2112 01/05/13 WBOND  GLUCOSE, CAPILLARY     Status: Abnormal   Collection Time    01/05/13  9:09 PM      Result Value Range   Glucose-Capillary 131 (*) 70 - 99 mg/dL  TROPONIN I     Status: Abnormal   Collection Time    01/06/13  2:44 AM      Result Value Range   Troponin I 0.42 (*) <0.30 ng/mL   Comment:            Due to the release kinetics of cTnI,     a negative result within the first hours     of the onset of symptoms does not rule out     myocardial infarction with certainty.     If myocardial infarction is still suspected,     repeat the test at appropriate intervals.     CRITICAL VALUE NOTED.  VALUE IS CONSISTENT WITH PREVIOUSLY REPORTED AND CALLED VALUE.  COMPREHENSIVE METABOLIC PANEL     Status: Abnormal   Collection Time  01/06/13  2:45 AM      Result Value Range   Sodium 139  135 - 145 mEq/L   Potassium 3.6  3.5 - 5.1 mEq/L    Chloride 106  96 - 112 mEq/L   CO2 24  19 - 32 mEq/L   Glucose, Bld 75  70 - 99 mg/dL   BUN 13  6 - 23 mg/dL   Creatinine, Ser 2.13  0.50 - 1.10 mg/dL   Calcium 9.0  8.4 - 08.6 mg/dL   Total Protein 6.6  6.0 - 8.3 g/dL   Albumin 3.6  3.5 - 5.2 g/dL   AST 16  0 - 37 U/L   ALT 11  0 - 35 U/L   Alkaline Phosphatase 56  39 - 117 U/L   Total Bilirubin 0.5  0.3 - 1.2 mg/dL   GFR calc non Af Amer 69 (*) >90 mL/min   GFR calc Af Amer 80 (*) >90 mL/min   Comment:            The eGFR has been calculated     using the CKD EPI equation.     This calculation has not been     validated in all clinical     situations.     eGFR's persistently     <90 mL/min signify     possible Chronic Kidney Disease.  CBC     Status: Abnormal   Collection Time    01/06/13  2:45 AM      Result Value Range   WBC 8.8  4.0 - 10.5 K/uL   RBC 3.78 (*) 3.87 - 5.11 MIL/uL   Hemoglobin 11.9 (*) 12.0 - 15.0 g/dL   HCT 57.8 (*) 46.9 - 62.9 %   MCV 88.1  78.0 - 100.0 fL   MCH 31.5  26.0 - 34.0 pg   MCHC 35.7  30.0 - 36.0 g/dL   RDW 52.8  41.3 - 24.4 %   Platelets 278  150 - 400 K/uL  LIPID PANEL     Status: Abnormal   Collection Time    01/06/13  2:45 AM      Result Value Range   Cholesterol 229 (*) 0 - 200 mg/dL   Triglycerides 010  <272 mg/dL   HDL 37 (*) >53 mg/dL   Total CHOL/HDL Ratio 6.2     VLDL 28  0 - 40 mg/dL   LDL Cholesterol 664 (*) 0 - 99 mg/dL   Comment:            Total Cholesterol/HDL:CHD Risk     Coronary Heart Disease Risk Table                         Men   Women      1/2 Average Risk   3.4   3.3      Average Risk       5.0   4.4      2 X Average Risk   9.6   7.1      3 X Average Risk  23.4   11.0                Use the calculated Patient Ratio     above and the CHD Risk Table     to determine the patient's CHD Risk.                ATP III CLASSIFICATION (LDL):      <  100     mg/dL   Optimal      409-811  mg/dL   Near or Above                        Optimal      130-159  mg/dL    Borderline      914-782  mg/dL   High      >956     mg/dL   Very High  GLUCOSE, CAPILLARY     Status: None   Collection Time    01/06/13  7:36 AM      Result Value Range   Glucose-Capillary 89  70 - 99 mg/dL  HEPARIN LEVEL (UNFRACTIONATED)     Status: Abnormal   Collection Time    01/06/13 10:41 AM      Result Value Range   Heparin Unfractionated 0.16 (*) 0.30 - 0.70 IU/mL   Comment:            IF HEPARIN RESULTS ARE BELOW     EXPECTED VALUES, AND PATIENT     DOSAGE HAS BEEN CONFIRMED,     SUGGEST FOLLOW UP TESTING     OF ANTITHROMBIN III LEVELS.  GLUCOSE, CAPILLARY     Status: None   Collection Time    01/06/13 11:59 AM      Result Value Range   Glucose-Capillary 92  70 - 99 mg/dL    Dg Chest 2 View  09/04/3084   *RADIOLOGY REPORT*  Clinical Data: Chest pain.  CHEST - 2 VIEW  Comparison: 07/05/2009.  Findings: The cardiac silhouette, mediastinal and hilar contours are normal.  The lungs are clear.  No pleural effusion.  The bony thorax is intact.  IMPRESSION: No acute cardiopulmonary findings.   Original Report Authenticated By: Rudie Meyer, M.D.    Review of Systems  Constitutional: Positive for malaise/fatigue.  Respiratory: Positive for shortness of breath. Negative for wheezing.   Cardiovascular: Positive for chest pain. Negative for leg swelling.  Gastrointestinal: Positive for heartburn.       Feels like food is sticking  Musculoskeletal: Positive for joint pain.  Neurological: Negative.   Endo/Heme/Allergies: Does not bruise/bleed easily.       Thyroidectomy, diabetes  Psychiatric/Behavioral: Positive for depression.  All other systems reviewed and are negative.   Blood pressure 115/58, pulse 84, temperature 98.9 F (37.2 C), temperature source Oral, resp. rate 14, height 5\' 2"  (1.575 m), weight 130 lb (58.968 kg), SpO2 100.00%. Physical Exam  Vitals reviewed. Constitutional: She is oriented to person, place, and time. She appears well-developed and  well-nourished. No distress.  HENT:  Head: Normocephalic and atraumatic.  alopecia  Eyes: EOM are normal. Pupils are equal, round, and reactive to light.  Neck: Neck supple. No thyromegaly present.  Well healed surgical scar  Cardiovascular: Normal rate, regular rhythm and intact distal pulses.  Exam reveals no gallop and no friction rub.   No murmur heard. Respiratory: Effort normal and breath sounds normal. She has no wheezes. She has no rales.  GI: Soft. There is no tenderness.  Musculoskeletal: Normal range of motion. She exhibits edema.  Neurological: She is alert and oriented to person, place, and time. No cranial nerve deficit.  No focal motor deficit  Skin: Skin is warm and dry.  Psychiatric: She has a normal mood and affect.   CARDIAC CATHETERIZATION] Procedural Findings:  Hemodynamics:  AO: 118/52 mmHg  LV: 121/5 mmHg  LVEDP: 11 mmHg  Coronary angiography:  Coronary dominance: right  Left Main: Normal in size with 20% distal disease.  Left anterior descending artery: Normal in size and heavily calcified in the proximal segment. There is a 60% proximal stenosis. In the midsegment, there is diffuse 50-60% disease and significant remodeling and the vessel. There is a 70-80% lesion distally.  1st diagonal (D1): Normal in size with 90% proximal disease.  2nd diagonal (D2): Very small in size.  3rd diagonal (D3): Very small in size.  Circumflex (LCx): Normal in size and nondominant. The vessel is mildly calcified. There is 95% diffuse disease in the midsegment. This is likely the culprit for NSTEMI.  1st obtuse marginal: Small in size with minor irregularities.  2nd obtuse marginal: Normal in size with 20% diffuse disease proximally.  3rd obtuse marginal: Normal in size with minor irregularities.  Right Coronary Artery: Normal in size and dominant. There is diffuse 40% disease proximally. There is an 80% discrete lesion in the midsegment. The rest of the vessel has minor  irregularities.  Posterior descending artery: Medium in size with no significant disease.  Posterior AV segment: Normal in size with diffuse 20% disease.  Posterolateral branchs: 40% disease proximally. Left ventriculography: Left ventricular systolic function is low normal , LVEF is estimated at 50 %, there is no significant mitral regurgitation  Final Conclusions:  1. Significant three-vessel coronary artery disease.  2. Low normal LV systolic function with an ejection fraction of 50%. Normal left ventricular end-diastolic pressure.  Recommendations:  I consulted CVTS for CABG. Although the disease in the left circumflex and right coronary artery is approachable by PCI, the patient has calcified and diffusely diseased diabetic vessels. Thus, CABG is probably a better option. She has reasonable targets in diagonal, LAD, OM 3 and right PL  Resume Heparin 8 hours post sheath pull.     Assessment/Plan: 72 yo woman with multiple CRF presents with an unstable coronary syndrome. She has been found to severe 3 vessel CAD with mild LV dysfunction. CABG is indicated for survival benefit and relief of symptoms.  I discussed with the patient and her family the general nature of the procedure, the need for general anesthesia, and the incisions to be used. I discussed the expected hospital stay, overall recovery and short and long term outcomes. We discussed the indications, risks, benefits and alternatives. She understands the risks include but are not limited to death, stroke, MI, DVT/PE, bleeding, possible need for transfusion, infections, cardiac arrhythmias and other organ system dysfunction, including respiratory, renal, or GI complications. She accepts these risks and agrees to proceed.  Carotid duplex OK  For CABG in AM  HENDRICKSON,STEVEN C 01/06/2013, 1:29 PM

## 2013-01-07 NOTE — Anesthesia Postprocedure Evaluation (Signed)
  Anesthesia Post-op Note  Patient: Angela Barrett  Procedure(s) Performed: Procedure(s): CORONARY ARTERY BYPASS GRAFTING (CABG) (N/A)  Patient Location: SICU  Anesthesia Type:General  Level of Consciousness: sedated and Patient remains intubated per anesthesia plan  Airway and Oxygen Therapy: Patient remains intubated per anesthesia plan and Patient placed on Ventilator (see vital sign flow sheet for setting)  Post-op Pain: Patient sedated, unable to evaluate  Post-op Assessment: Post-op Vital signs reviewed, Patient's Cardiovascular Status Stable and Respiratory Function Stable  Post-op Vital Signs: Reviewed and stable  Complications: No apparent anesthesia complications

## 2013-01-07 NOTE — Interval H&P Note (Signed)
History and Physical Interval Note:  01/07/2013 7:28 AM  Angela Barrett  has presented today for surgery, with the diagnosis of CAD  The various methods of treatment have been discussed with the patient and family. After consideration of risks, benefits and other options for treatment, the patient has consented to  Procedure(s): CORONARY ARTERY BYPASS GRAFTING (CABG) (N/A) as a surgical intervention .  The patient's history has been reviewed, patient examined, no change in status, stable for surgery.  I have reviewed the patient's chart and labs.  Questions were answered to the patient's satisfaction.     [name] C

## 2013-01-07 NOTE — OR Nursing (Signed)
45 minute call made to 2300 at 1352; spoke with Morrie Sheldon.  Volunteer desk called at 1352 to notify family that patient is off pump; spoke with Ginger.

## 2013-01-07 NOTE — Brief Op Note (Addendum)
      [address]       [place] [address]             [phone]     01/05/2013 - 01/07/2013  12:18 PM  PATIENT:  Angela Barrett  72 y.o. female  PRE-OPERATIVE DIAGNOSIS:  CAD  POST-OPERATIVE DIAGNOSIS:  Coronary Artery Disease  PROCEDURE:  Procedure(s): CORONARY ARTERY BYPASS GRAFTING (CABG)X 4 LIMA-LAD; SEQ SVG -OM3-PL; SVG- DIAG-1  SURGEON:  Surgeon(s): Loreli Slot, MD  PHYSICIAN ASSISTANT: WAYNE GOLD PA-C  ANESTHESIA:   general  PATIENT CONDITION:  ICU - intubated and hemodynamically stable.  PRE-OPERATIVE WEIGHT: 61.5kg  COMPLICATIONS: NO KNOWN  XC= 76 min CPB= 106 min  Diagonal poor target, remaining targets fair quality Vein fair, LIMA good

## 2013-01-07 NOTE — OR Nursing (Signed)
20 minute call made to 2300 at 131; spoke with Morrie Sheldon and on the way call made to 2300 at 1344.

## 2013-01-07 NOTE — Progress Notes (Signed)
Recently returned from all R. after CABG x4 Hemodynamic stable atrial paced Then weaned in progress Laboratory values reviewed and are within order

## 2013-01-07 NOTE — Progress Notes (Signed)
Pt failed wean attempt, > RR. Will try again ion 30 mins.

## 2013-01-07 NOTE — Anesthesia Postprocedure Evaluation (Signed)
  Anesthesia Post-op Note  Patient: Angela Barrett  Procedure(s) Performed: Procedure(s): CORONARY ARTERY BYPASS GRAFTING (CABG) (N/A)  Patient Location: SICU  Anesthesia Type:General  Level of Consciousness: sedated and Patient remains intubated per anesthesia plan  Airway and Oxygen Therapy: Patient remains intubated per anesthesia plan and Patient placed on Ventilator (see vital sign flow sheet for setting)  Post-op Pain: none  Post-op Assessment: Post-op Vital signs reviewed, Patient's Cardiovascular Status Stable, Respiratory Function Stable, Patent Airway, No signs of Nausea or vomiting and Pain level controlled  Post-op Vital Signs: stable  Complications: No apparent anesthesia complications

## 2013-01-08 ENCOUNTER — Inpatient Hospital Stay (HOSPITAL_COMMUNITY): Payer: Medicare Other

## 2013-01-08 DIAGNOSIS — E8779 Other fluid overload: Secondary | ICD-10-CM | POA: Diagnosis not present

## 2013-01-08 DIAGNOSIS — J9819 Other pulmonary collapse: Secondary | ICD-10-CM | POA: Diagnosis not present

## 2013-01-08 DIAGNOSIS — I214 Non-ST elevation (NSTEMI) myocardial infarction: Secondary | ICD-10-CM | POA: Diagnosis not present

## 2013-01-08 DIAGNOSIS — I2510 Atherosclerotic heart disease of native coronary artery without angina pectoris: Secondary | ICD-10-CM | POA: Diagnosis not present

## 2013-01-08 DIAGNOSIS — I200 Unstable angina: Secondary | ICD-10-CM | POA: Diagnosis not present

## 2013-01-08 DIAGNOSIS — E1169 Type 2 diabetes mellitus with other specified complication: Secondary | ICD-10-CM | POA: Diagnosis not present

## 2013-01-08 DIAGNOSIS — J9383 Other pneumothorax: Secondary | ICD-10-CM | POA: Diagnosis not present

## 2013-01-08 LAB — GLUCOSE, CAPILLARY
Glucose-Capillary: 100 mg/dL — ABNORMAL HIGH (ref 70–99)
Glucose-Capillary: 101 mg/dL — ABNORMAL HIGH (ref 70–99)
Glucose-Capillary: 101 mg/dL — ABNORMAL HIGH (ref 70–99)
Glucose-Capillary: 124 mg/dL — ABNORMAL HIGH (ref 70–99)
Glucose-Capillary: 138 mg/dL — ABNORMAL HIGH (ref 70–99)
Glucose-Capillary: 158 mg/dL — ABNORMAL HIGH (ref 70–99)
Glucose-Capillary: 176 mg/dL — ABNORMAL HIGH (ref 70–99)
Glucose-Capillary: 156 mg/dL — ABNORMAL HIGH (ref 70–99)
Glucose-Capillary: 234 mg/dL — ABNORMAL HIGH (ref 70–99)

## 2013-01-08 LAB — CBC
HCT: 29.2 % — ABNORMAL LOW (ref 36.0–46.0)
Hemoglobin: 10.4 g/dL — ABNORMAL LOW (ref 12.0–15.0)
Hemoglobin: 10.8 g/dL — ABNORMAL LOW (ref 12.0–15.0)
MCH: 30.9 pg (ref 26.0–34.0)
MCHC: 35.6 g/dL (ref 30.0–36.0)
MCV: 86.6 fL (ref 78.0–100.0)
Platelets: 164 10*3/uL (ref 150–400)
RBC: 3.52 MIL/uL — ABNORMAL LOW (ref 3.87–5.11)
RDW: 13.3 % (ref 11.5–15.5)
WBC: 14.8 10*3/uL — ABNORMAL HIGH (ref 4.0–10.5)

## 2013-01-08 LAB — POCT I-STAT, CHEM 8
BUN: 9 mg/dL (ref 6–23)
Chloride: 102 mEq/L (ref 96–112)
Potassium: 4.9 mEq/L (ref 3.5–5.1)
Sodium: 136 mEq/L (ref 135–145)
TCO2: 24 mmol/L (ref 0–100)

## 2013-01-08 LAB — BASIC METABOLIC PANEL
BUN: 9 mg/dL (ref 6–23)
Calcium: 8.3 mg/dL — ABNORMAL LOW (ref 8.4–10.5)
Creatinine, Ser: 0.64 mg/dL (ref 0.50–1.10)
GFR calc Af Amer: >90 mL/min (ref >90)
GFR calc non Af Amer: 88 mL/min — ABNORMAL LOW (ref >90)
Glucose, Bld: 105 mg/dL — ABNORMAL HIGH (ref 70–99)

## 2013-01-08 LAB — CREATININE, SERUM
Creatinine, Ser: 0.73 mg/dL (ref 0.50–1.10)
GFR calc Af Amer: >90 mL/min (ref >90)

## 2013-01-08 MED ORDER — FUROSEMIDE 10 MG/ML IJ SOLN
40.0000 mg | Freq: Once | INTRAMUSCULAR | Status: AC
  Administered 2013-01-08: 40 mg via INTRAVENOUS

## 2013-01-08 MED ORDER — FUROSEMIDE 10 MG/ML IJ SOLN
20.0000 mg | Freq: Two times a day (BID) | INTRAMUSCULAR | Status: DC
  Administered 2013-01-08: 20 mg via INTRAVENOUS

## 2013-01-08 MED ORDER — INSULIN ASPART 100 UNIT/ML ~~LOC~~ SOLN
3.0000 [IU] | Freq: Three times a day (TID) | SUBCUTANEOUS | Status: DC
  Administered 2013-01-08: 3 [IU] via SUBCUTANEOUS

## 2013-01-08 MED ORDER — INSULIN DETEMIR 100 UNIT/ML ~~LOC~~ SOLN
12.0000 [IU] | Freq: Two times a day (BID) | SUBCUTANEOUS | Status: DC
  Administered 2013-01-08 – 2013-01-10 (×5): 12 [IU] via SUBCUTANEOUS
  Filled 2013-01-08 (×6): qty 0.12

## 2013-01-08 MED ORDER — INSULIN ASPART 100 UNIT/ML ~~LOC~~ SOLN
0.0000 [IU] | SUBCUTANEOUS | Status: DC
  Administered 2013-01-08: 8 [IU] via SUBCUTANEOUS
  Administered 2013-01-08: 2 [IU] via SUBCUTANEOUS

## 2013-01-08 MED ORDER — TRAMADOL HCL 50 MG PO TABS
50.0000 mg | ORAL_TABLET | Freq: Four times a day (QID) | ORAL | Status: DC | PRN
  Administered 2013-01-09: 50 mg via ORAL
  Filled 2013-01-08: qty 1

## 2013-01-08 MED ORDER — POTASSIUM CHLORIDE 10 MEQ/50ML IV SOLN
10.0000 meq | INTRAVENOUS | Status: AC
  Administered 2013-01-08 (×2): 10 meq via INTRAVENOUS
  Filled 2013-01-08: qty 100

## 2013-01-08 NOTE — Op Note (Signed)
NAMESHAREKA, Angela Barrett                 ACCOUNT NO.:  [id number]  MEDICAL RECORD NO.:  [id number]  LOCATION:  2301                         FACILITY:  [facility]  PHYSICIAN:  Salvatore Decent. Dorris Fetch, M.D.DATE OF BIRTH:  1941/05/25  DATE OF PROCEDURE:  01/07/2013 DATE OF DISCHARGE:                              OPERATIVE REPORT   PREOPERATIVE DIAGNOSIS:  Severe three-vessel coronary artery disease, status post non-Q-wave myocardial infarction.  POSTOPERATIVE DIAGNOSIS:  Severe three-vessel coronary artery disease, status post non-Q-wave myocardial infarction.  PROCEDURE:  Median sternotomy, extracorporeal circulation, coronary artery bypass grafting x4 (left internal mammary artery to left anterior descending, saphenous vein graft to first diagonal, sequential saphenous vein graft to obtuse marginal 3 and right posterolateral branch) open vein harvest of right leg.  SURGEON:  Salvatore Decent. Dorris Fetch, M.D.  ASSISTANT:  Rowe Clack, PA-C  ANESTHESIA:  General.  FINDINGS:  Diffusely diseased coronaries- diagonal poor quality, other targets fair quality.  Vein fair quality, LIMA good quality.   Poor candidate for redo grafting.  CLINICAL NOTE:  [name] is a 72 year old woman who presented with a unstable cardiac syndrome.  She ruled in for a non Q-wave MI.  She had a cardiac catheterization, which revealed severe three-vessel coronary artery disease with diffusely diseased target vessels.  Left ventricular function was mildly decreased.  She was advised to undergo coronary artery bypass grafting.  The indications, risks, benefits, and alternatives were discussed in detail with the patient.  She understood and accepted the risks and agreed to proceed.  OPERATIVE NOTE:  [name] was brought to the preoperative holding area on January 07, 2013, there Anesthesia placed a Swan-Ganz catheter and arterial blood pressure monitoring line.  Intravenous antibiotics were administered.  She was taken to  the operating room, anesthetized, and intubated.  A Foley catheter was placed.  The chest, abdomen, and legs were prepped and draped in the usual fashion.  An incision was made in the right leg just below the knee on the medial aspect of the leg and the greater saphenous vein was identified.  The only identifiable vein branch was very small and relatively superficial and incision was made in the medial aspect of the left leg at the same level and the vein there appeared slightly larger. An attempt was made to harvest this endoscopically, but the vessel traversed very posteriorly and bifurcated and was not suitable for use as a bypass graft.  An incision was made just above the knee on the right leg.  The greater saphenous vein was identified.  There it was still a relatively small vessel not suitable for use, therefore, an incision was made in the right groin.  The skin and subcutaneous tissue were divided.  The greater saphenous vein was identified and dissected free.  It was dissected proximally to the saphenofemoral junction.  It was then harvested from the upper thigh with bridged incisions with a standard open technique.  This was a fair quality vein.  There was not going to be sufficient length for all the grafts, therefore, additional vein was harvested from the right ankle. The vein there was relatively small, but a better quality than it was around the  knee and was acceptable for use as a graft to the diagonal.  While harvesting the vein, a median sternotomy was performed and the left internal mammary artery was harvested using standard technique.  A 2000 units of heparin was administered prior to dividing the distal end of the mammary artery.  There was excellent flow through the vessel. It was placed in a papaverine soaked sponge.  After the conduits had been harvested, the pericardium was opened.  The remainder of the full heparin dose was given.  After confirming  adequate anticoagulation with ACT measurement, the aorta was cannulated via concentric 2-0 Ethibond pledgeted pursestring sutures.  A dual-stage venous cannula was placed via pursestring suture in the right atrial appendage.  Cardiopulmonary bypass was instituted and the patient was cooled to 32 degrees Celsius.  The coronary arteries were inspected and anastomotic sites were chosen.  Of note, the coronaries were very diffusely diseased.  There were many small branches that were not suitable for grafting.  The conduits were inspected and cut to length. A foam pad was placed in the pericardium to insulate the heart and protect the left phrenic nerve.  A temperature probe was placed in the myocardial septum and a cardioplegic cannula was placed in the ascending aorta.  The aorta was crossclamped.  The left ventricle was emptied via the aortic root vent.  Cardiac arrest was achieved with a combination of cold antegrade blood cardioplegia and topical iced saline.  500 mL of cardioplegia was administered.  There was a rapid diastolic arrest and myocardial septal cooling to less than 10 degrees Celsius.  The following distal anastomoses were performed.  First, a reversed saphenous vein graft was placed end-to-side to the first diagonal branch to the LAD.  This was a diffusely diseased bifurcating vessel, was grafted on the more lateral of the branches beyond the bifurcation.  This was slightly larger, but still was a small, poor quality target.  It did accept a 1 mm probe distally.  The vein was anastomosed end-to-side with a running 7-0 Prolene suture.  All anastomoses were probed proximally and distally at their completion to ensure patency before tying the sutures.  Cardioplegia was administered down each vein graft as completion to assess flow and hemostasis.  Next, a reversed saphenous vein graft was placed sequentially to obtuse marginal 3 and the posterolateral branch of the right  coronary.  These vessels were relatively close together on the posterolateral wall.  There was a natural bifurcation in the vein and a Y graft was created with 1 limb being sewn to OM1 and the other limb being sewn to the posterolateral, both of these were grafted with end-to-side anastomoses and both were done with running 7-0 Prolene sutures.  OM3 was a 1.5 mm fair quality target.  The posterolateral was a 1 mm fair quality target.  Cardioplegia was administered at the completion of the graft and there was good flow and good hemostasis.  Additional cardioplegia was administered down the veins as well as the aortic root.  The left internal mammary artery then was brought through a window in the pericardium.  The distal end was beveled and was anastomosed end-to- side to the distal LAD.  The LAD was a diffusely diseased vessel.  The mammary was a 1.5 mm good quality conduit.  An end-to-side anastomosis was performed with a running 8-0 Prolene suture.  At the completion of the mammary to LAD anastomosis, the bulldog clamp was briefly removed to inspect for hemostasis.  Immediate  and rapid septal rewarming was noted. The bulldog clamp was replaced and the mammary pedicle was tacked to the epicardial surface of the heart with 6-0 Prolene sutures.  Additional cardioplegia was administered.  The cardioplegic cannula then was removed from the ascending aorta.  The vein grafts were cut to length.  The proximal vein graft anastomoses were performed to 4.0 mm punch aortotomies with a running 6-0 Prolene sutures.  At the completion of the final proximal anastomosis, the patient was placed in Trendelenburg position.  Lidocaine was administered.  The aortic root was de-aired.  The aortic crossclamp was removed.  The total crossclamp time was 76 minutes.  The patient did not require defibrillation, but was initially in heart block.  While rewarming was completed, all proximal and distal  anastomoses were inspected for hemostasis.  Epicardial pacing wires were placed on the right ventricle and right atrium and DDD pacing was initiated, and the patient had rewarmed to a core temperature of 37 degrees Celsius.  She was weaned from cardiopulmonary bypass on the first attempt without inotropic support.  She was atrially paced at the time of separation from bypass.  The total bypass time was 106 minutes.  The initial cardiac index was greater than 2 L/minute/meter squared.  The patient remained hemodynamically stable throughout the post bypass period.  A test dose of protamine was administered and was well tolerated.  The atrial and aortic cannulae were removed.  The remainder of the protamine was administered without incident.  The chest was irrigated with a warm saline.  Hemostasis was achieved.  A left pleural and single mediastinal chest tubes were placed in a separate subcostal incisions.  The pericardium was reapproximated with interrupted 3-0 silk sutures.  It came together easily without tension or kinking the underlying grafts. The sternum was closed with interrupted heavy gauge stainless steel wires.  The pectoralis fascia, subcutaneous tissue, and skin were closed in a standard fashion.  All sponge, needle, and instrument counts were correct at the end of the procedure.  The patient was taken from the operating room to the surgical intensive care unit in good condition.     Salvatore Decent Dorris Fetch, M.D.     SCH/MEDQ  D:  01/07/2013  T:  01/08/2013  Job:  [id number]

## 2013-01-08 NOTE — Progress Notes (Signed)
Patient examined and record reviewed.Hemodynamics stable,labs satisfactory.Patient had stable day.Continue current care. VAN TRIGT III,Johsua Shevlin 01/08/2013

## 2013-01-08 NOTE — Progress Notes (Signed)
1 Day Post-Op Procedure(s) (LRB): CORONARY ARTERY BYPASS GRAFTING (CABG) (N/A) Subjective: Doing well after CABGx4 weaned of drips  Objective: Vital signs in last 24 hours: Temp:  [94.8 F (34.9 C)-99.9 F (37.7 C)] 99.1 F (37.3 C) (06/07 0900) Pulse Rate:  [67-90] 71 (06/07 0900) Cardiac Rhythm:  [-] Normal sinus rhythm (06/07 0800) Resp:  [10-27] 21 (06/07 0900) BP: (75-125)/(49-60) 104/57 mmHg (06/07 0900) SpO2:  [99 %-100 %] 99 % (06/07 0900) Arterial Line BP: (57-161)/(36-87) 128/55 mmHg (06/07 0900) FiO2 (%):  [40 %-50.8 %] 40.4 % (06/06 1830) Weight:  [144 lb 2.9 oz (65.4 kg)] 144 lb 2.9 oz (65.4 kg) (06/07 0530)  Hemodynamic parameters for last 24 hours: PAP: (22-49)/(11-23) 49/23 mmHg CO:  [2.4 L/min-3.3 L/min] 3.1 L/min CI:  [1.5 L/min/m2-2 L/min/m2] 1.9 L/min/m2  Intake/Output from previous day: 06/06 0701 - 06/07 0700 In: 5229.5 [P.O.:120; I.V.:3179.5; Blood:350; NG/GT:30; IV Piggyback:1550] Out: 5967 [Urine:3970; Emesis/NG output:190; Blood:1625; Chest Tube:182] Intake/Output this shift: Total I/O In: 131 [P.O.:30; I.V.:101] Out: 85 [Urine:65; Chest Tube:20]  Alert and comfortable Mild edema Lab Results:  Recent Labs  01/07/13 1900 01/07/13 1944 01/08/13 0400  WBC 10.3  --  10.0  HGB 10.3* 9.5* 10.4*  HCT 28.5* 28.0* 29.2*  PLT 117*  --  132*   BMET:  Recent Labs  01/07/13 0435  01/07/13 1944 01/08/13 0400  NA 132*  < > 137 137  K 3.8  < > 4.3 4.0  CL 99  --  106 104  CO2 23  --   --  24  GLUCOSE 197*  < > 167* 105*  BUN 16  --  9 9  CREATININE 0.90  < > 0.70 0.64  CALCIUM 8.8  --   --  8.3*  < > = values in this interval not displayed.  PT/INR:  Recent Labs  01/07/13 1400  LABPROT 17.0*  INR 1.42   ABG    Component Value Date/Time   PHART 7.291* 01/07/2013 1939   HCO3 20.9 01/07/2013 1939   TCO2 21 01/07/2013 1944   ACIDBASEDEF 5.0* 01/07/2013 1939   O2SAT 98.0 01/07/2013 1939   CBG (last 3)   Recent Labs  01/07/13 1658  01/07/13 1759 01/07/13 1843  GLUCAP 91 85 123*    Assessment/Plan: S/P Procedure(s) (LRB): CORONARY ARTERY BYPASS GRAFTING (CABG) (N/A) See progression orders Resume levimir insulin  LOS: 3 days    VAN TRIGT III,Amairani Shuey 01/08/2013

## 2013-01-09 ENCOUNTER — Inpatient Hospital Stay (HOSPITAL_COMMUNITY): Payer: Medicare Other

## 2013-01-09 DIAGNOSIS — J811 Chronic pulmonary edema: Secondary | ICD-10-CM | POA: Diagnosis not present

## 2013-01-09 DIAGNOSIS — Z452 Encounter for adjustment and management of vascular access device: Secondary | ICD-10-CM | POA: Diagnosis not present

## 2013-01-09 LAB — CBC
HCT: 29.5 % — ABNORMAL LOW (ref 36.0–46.0)
Hemoglobin: 10.2 g/dL — ABNORMAL LOW (ref 12.0–15.0)
MCH: 30.7 pg (ref 26.0–34.0)
MCHC: 34.6 g/dL (ref 30.0–36.0)
MCV: 88.9 fL (ref 78.0–100.0)
Platelets: 147 10*3/uL — ABNORMAL LOW (ref 150–400)
RBC: 3.32 MIL/uL — ABNORMAL LOW (ref 3.87–5.11)
RDW: 13.4 % (ref 11.5–15.5)
WBC: 12.6 10*3/uL — ABNORMAL HIGH (ref 4.0–10.5)

## 2013-01-09 LAB — BASIC METABOLIC PANEL
BUN: 11 mg/dL (ref 6–23)
CO2: 28 mEq/L (ref 19–32)
Calcium: 8.3 mg/dL — ABNORMAL LOW (ref 8.4–10.5)
Chloride: 101 mEq/L (ref 96–112)
Creatinine, Ser: 0.81 mg/dL (ref 0.50–1.10)
GFR calc Af Amer: 83 mL/min — ABNORMAL LOW (ref >90)
GFR calc non Af Amer: 71 mL/min — ABNORMAL LOW (ref >90)
Glucose, Bld: 81 mg/dL (ref 70–99)
Potassium: 3.8 mEq/L (ref 3.5–5.1)
Sodium: 135 mEq/L (ref 135–145)

## 2013-01-09 LAB — GLUCOSE, CAPILLARY
Glucose-Capillary: 105 mg/dL — ABNORMAL HIGH (ref 70–99)
Glucose-Capillary: 174 mg/dL — ABNORMAL HIGH (ref 70–99)
Glucose-Capillary: 230 mg/dL — ABNORMAL HIGH (ref 70–99)

## 2013-01-09 MED ORDER — SODIUM CHLORIDE 0.9 % IJ SOLN: 3.0000 mL | INTRAMUSCULAR | Status: DC | PRN

## 2013-01-09 MED ORDER — POTASSIUM CHLORIDE CRYS ER 20 MEQ PO TBCR
20.0000 meq | EXTENDED_RELEASE_TABLET | Freq: Every day | ORAL | Status: DC
  Administered 2013-01-10 – 2013-01-12 (×3): 20 meq via ORAL
  Filled 2013-01-09 (×3): qty 1

## 2013-01-09 MED ORDER — MOVING RIGHT ALONG BOOK
Freq: Once | Status: AC
  Administered 2013-01-09: 20:00:00
  Filled 2013-01-09: qty 1

## 2013-01-09 MED ORDER — SODIUM CHLORIDE 0.9 % IJ SOLN
3.0000 mL | Freq: Two times a day (BID) | INTRAMUSCULAR | Status: DC
  Administered 2013-01-09 – 2013-01-11 (×5): 3 mL via INTRAVENOUS

## 2013-01-09 MED ORDER — INSULIN ASPART 100 UNIT/ML ~~LOC~~ SOLN
0.0000 [IU] | Freq: Three times a day (TID) | SUBCUTANEOUS | Status: DC
  Administered 2013-01-09: 8 [IU] via SUBCUTANEOUS
  Administered 2013-01-10 (×2): 2 [IU] via SUBCUTANEOUS
  Administered 2013-01-11: 4 [IU] via SUBCUTANEOUS
  Administered 2013-01-11 (×3): 2 [IU] via SUBCUTANEOUS

## 2013-01-09 MED ORDER — FE FUMARATE-B12-VIT C-FA-IFC PO CAPS
1.0000 | ORAL_CAPSULE | Freq: Three times a day (TID) | ORAL | Status: DC
  Administered 2013-01-09 – 2013-01-11 (×7): 1 via ORAL
  Filled 2013-01-09 (×10): qty 1

## 2013-01-09 MED ORDER — FUROSEMIDE 10 MG/ML IJ SOLN
40.0000 mg | Freq: Once | INTRAMUSCULAR | Status: AC
  Administered 2013-01-09: 40 mg via INTRAVENOUS
  Filled 2013-01-09: qty 4

## 2013-01-09 MED ORDER — POTASSIUM CHLORIDE 10 MEQ/50ML IV SOLN
10.0000 meq | INTRAVENOUS | Status: AC
  Administered 2013-01-09 (×2): 10 meq via INTRAVENOUS
  Filled 2013-01-09: qty 100

## 2013-01-09 MED ORDER — ALUM & MAG HYDROXIDE-SIMETH 200-200-20 MG/5ML PO SUSP: 15.0000 mL | ORAL | Status: DC | PRN

## 2013-01-09 MED ORDER — SODIUM CHLORIDE 0.9 % IV SOLN: 250.0000 mL | INTRAVENOUS | Status: DC | PRN

## 2013-01-09 MED ORDER — OXYCODONE HCL 5 MG PO TABS
5.0000 mg | ORAL_TABLET | ORAL | Status: DC | PRN
  Administered 2013-01-09 – 2013-01-10 (×3): 5 mg via ORAL
  Filled 2013-01-09: qty 1
  Filled 2013-01-09: qty 2
  Filled 2013-01-09 (×2): qty 1

## 2013-01-09 MED ORDER — FUROSEMIDE 40 MG PO TABS
40.0000 mg | ORAL_TABLET | Freq: Every day | ORAL | Status: DC
  Administered 2013-01-10 – 2013-01-12 (×3): 40 mg via ORAL
  Filled 2013-01-09 (×3): qty 1

## 2013-01-09 NOTE — Progress Notes (Signed)
2 Days Post-Op Procedure(s) (LRB): CORONARY ARTERY BYPASS GRAFTING (CABG) (N/A) Subjective: Feels better Walking in h all yesterday  Objective: Vital signs in last 24 hours: Temp:  [97.8 F (36.6 C)-99.3 F (37.4 C)] 98.1 F (36.7 C) (06/08 0722) Pulse Rate:  [63-90] 89 (06/08 0800) Cardiac Rhythm:  [-] Normal sinus rhythm;Atrial paced (06/08 0800) Resp:  [13-25] 20 (06/08 0800) BP: (85-132)/(41-88) 118/53 mmHg (06/08 0800) SpO2:  [89 %-100 %] 99 % (06/08 0800) Arterial Line BP: (131-158)/(53-56) 131/53 mmHg (06/07 1200) Weight:  [136 lb 7.4 oz (61.9 kg)] 136 lb 7.4 oz (61.9 kg) (06/08 0600)  Hemodynamic parameters for last 24 hours: PAP: (41-48)/(19-21) 41/19 mmHg  Intake/Output from previous day: 06/07 0701 - 06/08 0700 In: 1445.7 [P.O.:750; I.V.:495.7; IV Piggyback:200] Out: 2970 [Urine:2950; Chest Tube:20] Intake/Output this shift: Total I/O In: 50 [P.O.:50] Out: 60 [Urine:60]  Exam Comfortable sitting in chair Diminished breath sounds at bases Mild peripheral edema Weight up 9 pounds  Lab Results:  Recent Labs  01/08/13 1550 01/09/13 0417  WBC 14.8* 12.6*  HGB 10.8* 10.2*  HCT 30.9* 29.5*  PLT 164 147*   BMET:  Recent Labs  01/08/13 0400 01/08/13 1545 01/08/13 1550 01/09/13 0417  NA 137 136  --  135  K 4.0 4.9  --  3.8  CL 104 102  --  101  CO2 24  --   --  28  GLUCOSE 105* 138*  --  81  BUN 9 9  --  11  CREATININE 0.64 0.70 0.73 0.81  CALCIUM 8.3*  --   --  8.3*    PT/INR:  Recent Labs  01/07/13 1400  LABPROT 17.0*  INR 1.42   ABG    Component Value Date/Time   PHART 7.291* 01/07/2013 1939   HCO3 20.9 01/07/2013 1939   TCO2 24 01/08/2013 1545   ACIDBASEDEF 5.0* 01/07/2013 1939   O2SAT 98.0 01/07/2013 1939   CBG (last 3)   Recent Labs  01/09/13 0003 01/09/13 0409 01/09/13 0720  GLUCAP 105* 74 110*    Assessment/Plan: S/P Procedure(s) (LRB): CORONARY ARTERY BYPASS GRAFTING (CABG) (N/A) Plan for transfer to step-down: see transfer  orders Continue Lasix and atrially paced  LOS: 4 days    VAN TRIGT III,Angela Barrett 01/09/2013

## 2013-01-10 ENCOUNTER — Encounter (HOSPITAL_COMMUNITY): Payer: Self-pay | Admitting: Thoracic Surgery (Cardiothoracic Vascular Surgery)

## 2013-01-10 ENCOUNTER — Inpatient Hospital Stay (HOSPITAL_COMMUNITY): Payer: Medicare Other

## 2013-01-10 DIAGNOSIS — J811 Chronic pulmonary edema: Secondary | ICD-10-CM | POA: Diagnosis not present

## 2013-01-10 LAB — CBC
HCT: 30.4 % — ABNORMAL LOW (ref 36.0–46.0)
Hemoglobin: 10.8 g/dL — ABNORMAL LOW (ref 12.0–15.0)
MCH: 31.4 pg (ref 26.0–34.0)
MCHC: 35.5 g/dL (ref 30.0–36.0)
MCV: 88.4 fL (ref 78.0–100.0)
Platelets: 187 10*3/uL (ref 150–400)
RBC: 3.44 MIL/uL — ABNORMAL LOW (ref 3.87–5.11)
RDW: 12.9 % (ref 11.5–15.5)
WBC: 9.6 10*3/uL (ref 4.0–10.5)

## 2013-01-10 LAB — POCT I-STAT 3, ART BLOOD GAS (G3+)
Acid-base deficit: 3 mmol/L — ABNORMAL HIGH (ref 0.0–2.0)
Bicarbonate: 21.7 mEq/L (ref 20.0–24.0)
O2 Saturation: 99 %
Patient temperature: 35.8

## 2013-01-10 LAB — TYPE AND SCREEN
ABO/RH(D): O NEG
Antibody Screen: NEGATIVE
Unit division: 0
Unit division: 0

## 2013-01-10 LAB — POCT I-STAT 4, (NA,K, GLUC, HGB,HCT)
Hemoglobin: 10.5 g/dL — ABNORMAL LOW (ref 12.0–15.0)
Potassium: 4.3 mEq/L (ref 3.5–5.1)

## 2013-01-10 LAB — GLUCOSE, CAPILLARY
Glucose-Capillary: 167 mg/dL — ABNORMAL HIGH (ref 70–99)
Glucose-Capillary: 110 mg/dL — ABNORMAL HIGH (ref 70–99)

## 2013-01-10 LAB — BASIC METABOLIC PANEL
BUN: 12 mg/dL (ref 6–23)
CO2: 31 mEq/L (ref 19–32)
Calcium: 8.5 mg/dL (ref 8.4–10.5)
Chloride: 100 mEq/L (ref 96–112)
Creatinine, Ser: 0.78 mg/dL (ref 0.50–1.10)
GFR calc Af Amer: >90 mL/min (ref >90)
GFR calc non Af Amer: 82 mL/min — ABNORMAL LOW (ref >90)
Glucose, Bld: 53 mg/dL — ABNORMAL LOW (ref 70–99)
Potassium: 3.8 mEq/L (ref 3.5–5.1)
Sodium: 137 mEq/L (ref 135–145)

## 2013-01-10 MED ORDER — METOPROLOL TARTRATE 25 MG/10 ML ORAL SUSPENSION
25.0000 mg | Freq: Two times a day (BID) | ORAL | Status: DC
  Filled 2013-01-10 (×5): qty 10

## 2013-01-10 MED ORDER — INSULIN ASPART 100 UNIT/ML ~~LOC~~ SOLN: 3.0000 [IU] | Freq: Two times a day (BID) | SUBCUTANEOUS | Status: DC

## 2013-01-10 MED ORDER — METFORMIN HCL ER 500 MG PO TB24
500.0000 mg | ORAL_TABLET | Freq: Every day | ORAL | Status: DC
  Administered 2013-01-11 – 2013-01-12 (×2): 500 mg via ORAL
  Filled 2013-01-10 (×3): qty 1

## 2013-01-10 MED ORDER — LISINOPRIL 2.5 MG PO TABS
2.5000 mg | ORAL_TABLET | Freq: Every day | ORAL | Status: DC
  Administered 2013-01-10 – 2013-01-11 (×2): 2.5 mg via ORAL
  Filled 2013-01-10 (×3): qty 1

## 2013-01-10 MED ORDER — METOPROLOL TARTRATE 25 MG PO TABS
25.0000 mg | ORAL_TABLET | Freq: Two times a day (BID) | ORAL | Status: DC
  Administered 2013-01-10 – 2013-01-12 (×4): 25 mg via ORAL
  Filled 2013-01-10 (×5): qty 1

## 2013-01-10 MED ORDER — GLUCOSE 40 % PO GEL
ORAL | Status: AC
  Administered 2013-01-10: 37.5 g
  Filled 2013-01-10: qty 1

## 2013-01-10 MED ORDER — INSULIN DETEMIR 100 UNIT/ML ~~LOC~~ SOLN
12.0000 [IU] | Freq: Every day | SUBCUTANEOUS | Status: DC
  Filled 2013-01-10: qty 0.12

## 2013-01-10 MED ORDER — LORAZEPAM 1 MG PO TABS
2.0000 mg | ORAL_TABLET | Freq: Every day | ORAL | Status: DC
  Administered 2013-01-10 – 2013-01-11 (×2): 2 mg via ORAL
  Filled 2013-01-10 (×2): qty 2

## 2013-01-10 MED FILL — Potassium Chloride Inj 2 mEq/ML: INTRAVENOUS | Qty: 40 | Status: AC

## 2013-01-10 MED FILL — Lidocaine HCl IV Inj 20 MG/ML: INTRAVENOUS | Qty: 5 | Status: AC

## 2013-01-10 MED FILL — Sodium Chloride Irrigation Soln 0.9%: Qty: 3000 | Status: AC

## 2013-01-10 MED FILL — Heparin Sodium (Porcine) Inj 1000 Unit/ML: INTRAMUSCULAR | Qty: 30 | Status: AC

## 2013-01-10 MED FILL — Electrolyte-R (PH 7.4) Solution: INTRAVENOUS | Qty: 3000 | Status: AC

## 2013-01-10 MED FILL — Heparin Sodium (Porcine) Inj 1000 Unit/ML: INTRAMUSCULAR | Qty: 10 | Status: AC

## 2013-01-10 MED FILL — Mannitol IV Soln 20%: INTRAVENOUS | Qty: 500 | Status: AC

## 2013-01-10 MED FILL — Sodium Bicarbonate IV Soln 8.4%: INTRAVENOUS | Qty: 50 | Status: AC

## 2013-01-10 MED FILL — Magnesium Sulfate Inj 50%: INTRAMUSCULAR | Qty: 10 | Status: AC

## 2013-01-10 MED FILL — Sodium Chloride IV Soln 0.9%: INTRAVENOUS | Qty: 1000 | Status: AC

## 2013-01-10 NOTE — Progress Notes (Signed)
CARDIAC REHAB PHASE I   PRE:  Rate/Rhythm: 84SR  BP:  Supine:   Sitting: 140/64  Standing:    SaO2: 99%RA  MODE:  Ambulation: 550 ft   POST:  Rate/Rhythm: 102 ST  BP:  Supine:   Sitting: 140/60  Standing:    SaO2: 91%RA 0940-1005 Pt walked 550 ft on RA with rolling walker and asst x 1. Pt stopped once to rest. Tolerated well. To recliner with call bell after walk. Took oxygen off after walk to try to wean.    Luetta Nutting, RN BSN  01/10/2013 10:02 AM

## 2013-01-10 NOTE — Evaluation (Signed)
Physical Therapy Evaluation Patient Details Name: Angela Barrett MRN: [id number] DOB: [birth year] Today's Date: 01/10/2013 Time: 4098-1191 PT Time Calculation (min): 27 min  PT Assessment / Plan / Recommendation Clinical Impression   Pt is a pleasant 72 y.o. Female s/p CABG sx. Patient demonstrates some deficits in functional mobility as indicated below. Will benefit from continued skilled PT to address deficits and maximize independence for discharge home with family.  Will see as indicated.    PT Assessment  Patient needs continued PT services    Follow Up Recommendations  Home health PT;Supervision - Intermittent    Does the patient have the potential to tolerate intense rehabilitation      Barriers to Discharge None      Equipment Recommendations  None recommended by PT    Recommendations for Other Services OT consult   Frequency Min 3X/week    Precautions / Restrictions Precautions Precautions: Sternal Precaution Comments: educated pt and family on sternal precautions, positioning, and techniques for adherence Restrictions Weight Bearing Restrictions: Yes (sternal precautions)   Pertinent Vitals/Pain 3/10 pain; SpO2 on rm air at rest 94%; with ambulation decreased to 89% but rebounded quickly      Mobility  Bed Mobility Bed Mobility: Not assessed Details for Bed Mobility Assistance: Verbally discussed log roll technique; patient states she used this technique after a bladder surgery and has become acustomed to it; will assess  Transfers Transfers: Sit to Stand;Stand to Sit Sit to Stand: 4: Min guard;From chair/3-in-1;From toilet Stand to Sit: 4: Min guard;To chair/3-in-1;To toilet Details for Transfer Assistance: VCs for technique and use of heart pillow for sternal precautions; pt performed transfer well without manual assist Ambulation/Gait Ambulation/Gait Assistance: 5: Supervision;4: Min guard Ambulation Distance (Feet): 160 Feet Assistive device: Rolling  walker Ambulation/Gait Assistance Details: Ambulated on rm air; steady with gait; 3 rest breaks to assess O2; O2 lowest was 88% rebounded well with rest and pursed lip breathing Gait Pattern: Step-through pattern;Decreased stride length;Trunk flexed;Narrow base of support (Vcs for increased stride length) Gait velocity: decreased General Gait Details: pt steady with ambulation using rw Stairs: No    Exercises     PT Diagnosis: Difficulty walking;Abnormality of gait;Generalized weakness;Acute pain  PT Problem List: Decreased strength;Decreased range of motion;Decreased activity tolerance;Decreased balance;Decreased mobility;Decreased knowledge of precautions;Cardiopulmonary status limiting activity PT Treatment Interventions: DME instruction;Gait training;Stair training;Functional mobility training;Therapeutic activities;Therapeutic exercise;Balance training;Patient/family education   PT Goals Acute Rehab PT Goals PT Goal Formulation: With patient/family Time For Goal Achievement: 01/24/13 Potential to Achieve Goals: Good Pt will go Supine/Side to Sit: with supervision PT Goal: Supine/Side to Sit - Progress: Goal set today Pt will go Sit to Stand: with supervision PT Goal: Sit to Stand - Progress: Goal set today Pt will Ambulate: >150 feet;with supervision PT Goal: Ambulate - Progress: Goal set today Pt will Go Up / Down Stairs: 3-5 stairs;with min assist PT Goal: Up/Down Stairs - Progress: Goal set today  Visit Information  Last PT Received On: 01/10/13 Assistance Needed: +1    Subjective Data  Subjective: I feel much better today Patient Stated Goal: to go home   Prior Functioning  Home Living Lives With: Spouse Available Help at Discharge: Family;Available 24 hours/day Type of Home: House Home Access: Stairs to enter [employer] of Steps: 3 Entrance Stairs-Rails: Right;Left Home Layout: One level Bathroom Shower/Tub: Tub/shower unit;Curtain Bathroom Toilet:  Handicapped height Home Adaptive Equipment: Raised toilet seat with rails;Straight cane;Walker - rolling;Walker - four wheeled Prior Function Level of Independence: Independent Able to  Take Stairs?: Yes Driving: Yes Vocation: Retired Musician: No difficulties Dominant Hand: Right    Cognition  Cognition Arousal/Alertness: Awake/alert Behavior During Therapy: WFL for tasks assessed/performed Overall Cognitive Status: Within Functional Limits for tasks assessed    Extremity/Trunk Assessment Right Upper Extremity Assessment RUE ROM/Strength/Tone: WFL for tasks assessed;Deficits;Due to precautions RUE ROM/Strength/Tone Deficits: sternal precuations RUE Coordination: WFL - gross/fine motor Left Upper Extremity Assessment LUE ROM/Strength/Tone: WFL for tasks assessed;Deficits;Unable to fully assess;Due to precautions LUE ROM/Strength/Tone Deficits: sternal precuations LUE Coordination: WFL - gross/fine motor Right Lower Extremity Assessment RLE ROM/Strength/Tone: WFL for tasks assessed RLE Coordination: WFL - gross/fine motor Left Lower Extremity Assessment LLE ROM/Strength/Tone: WFL for tasks assessed LLE Coordination: WFL - gross/fine motor   Balance Balance Balance Assessed: Yes Static Standing Balance Static Standing - Balance Support: During functional activity Static Standing - Level of Assistance: 5: Stand by assistance Static Standing - Comment/# of Minutes: 2 minutes at counter and sink for hygiene purposes  End of Session PT - End of Session Equipment Utilized During Treatment: Gait belt Activity Tolerance: Patient tolerated treatment well;Patient limited by fatigue Patient left: in chair;with call bell/phone within reach;Other (comment) (doctor in room) Nurse Communication: Mobility status  GP     Fabio Asa 01/10/2013, 12:35 PM Charlotte Crumb, PT DPT  [phone]

## 2013-01-10 NOTE — Plan of Care (Signed)
Problem: Phase III Progression Outcomes Goal: Time patient transferred to PCTU/Telemetry POD Outcome: Completed/Met Date Met:  01/09/13 1700

## 2013-01-10 NOTE — Progress Notes (Signed)
Pt ambulated 556ft x 1 assist, with family. Steady gait, stopped once to rest.  Will continue to monitor

## 2013-01-10 NOTE — Progress Notes (Signed)
Pt. Said "I am feel tired I can't walk today".

## 2013-01-10 NOTE — Progress Notes (Addendum)
                    [address]            [place] [address]          [phone]     3 Days Post-Op Procedure(s) (LRB): CORONARY ARTERY BYPASS GRAFTING (CABG) (N/A)  Subjective: Feels well.  Just back from walking with PT.  No complaints. Appetite fair. Breathing stable.   Objective: Vital signs in last 24 hours: Patient Vitals for the past 24 hrs:  BP Temp Temp src Pulse Resp SpO2 Weight  01/10/13 1045 125/61 mmHg - - 82 - - -  01/10/13 0349 127/56 mmHg 98.1 F (36.7 C) Oral 80 19 98 % 136 lb 8 oz (61.916 kg)  01/09/13 2009 135/60 mmHg 98.6 F (37 C) Oral 96 19 94 % -  01/09/13 1724 121/64 mmHg 98.2 F (36.8 C) Oral 80 18 97 % -  01/09/13 1500 110/51 mmHg - - 80 19 97 % -  01/09/13 1400 112/53 mmHg - - 80 21 100 % -  01/09/13 1300 155/66 mmHg - - 85 23 98 % -  01/09/13 1214 - 98.2 F (36.8 C) Oral - - - -  01/09/13 1200 133/60 mmHg - - 90 26 98 % -   Current Weight  01/10/13 136 lb 8 oz (61.916 kg)  PRE-OPERATIVE WEIGHT: 61.5kg    Intake/Output from previous day: 06/08 0701 - 06/09 0700 In: 380 [P.O.:300; I.V.:80] Out: 3890 [Urine:3890]  CBGs 230-53-58-167  PHYSICAL EXAM:  Heart: RRR, HR in 70s under pacer Lungs: Clear Wound: Clean and dry Extremities: No significant LE edema    Lab Results: CBC: Recent Labs  01/09/13 0417 01/10/13 0342  WBC 12.6* 9.6  HGB 10.2* 10.8*  HCT 29.5* 30.4*  PLT 147* 187   BMET:  Recent Labs  01/09/13 0417 01/10/13 0342  NA 135 137  K 3.8 3.8  CL 101 100  CO2 28 31  GLUCOSE 81 53*  BUN 11 12  CREATININE 0.81 0.78  CALCIUM 8.3* 8.5    PT/INR:  Recent Labs  01/07/13 1400  LABPROT 17.0*  INR 1.42      Assessment/Plan: S/P Procedure(s) (LRB): CORONARY ARTERY BYPASS GRAFTING (CABG) (N/A)  CV- HR 70s, SR with 1st degree block under pacer.  Will place pacer on backup at 60.  Hopefully can d/c pacer box soon.  BPs trending up.  Continue current Lopressor dose and start ACE-I.  Vol  overload- diurese.  DM- hyperglycemic during the day, but having hypoglycemia in the early am.  Pt states that she is very sensitive to insulin and won't take it if her sugars are under 200 at home.  Will d/c pm dose of Novolog as recommended, start to resume home meds as tolerated. A1C=6.8.  Continue CRPI/PT, pulm toilet.  Possibly home in next few days if she continues to progress.   LOS: 5 days    COLLINS,GINA H 01/10/2013  Patient seen and examined. Agree with above. C/o anxiety and inability to sleep. Has been taking 2 mg of ativan QHS for 20 years- will reorder

## 2013-01-10 NOTE — Progress Notes (Signed)
Inpatient Diabetes Program Recommendations  AACE/ADA: New Consensus Statement on Inpatient Glycemic Control (2013)  Target Ranges:  Prepandial:   less than 140 mg/dL      Peak postprandial:   less than 180 mg/dL (1-2 hours)      Critically ill patients:  140 - 180 mg/dL  Results for DEISY, OZBUN (MRN [id number]) as of 01/10/2013 11:12  Ref. Range 01/09/2013 12:12 01/09/2013 16:17 01/09/2013 21:16 01/10/2013 05:55 01/10/2013 06:37  Glucose-Capillary Latest Range: 70-99 mg/dL 865 (H) 784 (H) 696 (H) 58 (L) 167 (H)   Please consider discontinuing Novolog 0-24 at HS.  Novolog 8 units given last night and patient with HYPOglycemia this morning.   Thank you  Piedad Climes BSN, RN,CDE Inpatient Diabetes Coordinator [phone] (team pager)

## 2013-01-10 NOTE — Progress Notes (Signed)
Hypoglycemic Event  CBG: 58  Treatment: oral glucolse jel  Symptoms:   Follow-up CBG: [id number] CBG Result:167  Possible Reasons for Event: poor food intake   Comments/MD notified:no    Angela Barrett T  Remember to initiate Hypoglycemia Order Set & complete

## 2013-01-11 LAB — CBC
MCH: 30.6 pg (ref 26.0–34.0)
MCHC: 34.1 g/dL (ref 30.0–36.0)
MCV: 89.9 fL (ref 78.0–100.0)
Platelets: 286 10*3/uL (ref 150–400)
RDW: 13 % (ref 11.5–15.5)

## 2013-01-11 LAB — BASIC METABOLIC PANEL
Calcium: 8.6 mg/dL (ref 8.4–10.5)
Creatinine, Ser: 0.78 mg/dL (ref 0.50–1.10)
GFR calc Af Amer: >90 mL/min (ref >90)
GFR calc non Af Amer: 82 mL/min — ABNORMAL LOW (ref >90)

## 2013-01-11 LAB — GLUCOSE, CAPILLARY

## 2013-01-11 LAB — TYPE AND SCREEN: ABO/RH(D): O NEG

## 2013-01-11 MED ORDER — KETOTIFEN FUMARATE 0.025 % OP SOLN: 1.0000 [drp] | OPHTHALMIC | Status: DC | PRN

## 2013-01-11 MED ORDER — EXENATIDE 10 MCG/0.04ML ~~LOC~~ SOPN: 10.0000 ug | PEN_INJECTOR | Freq: Every day | SUBCUTANEOUS | Status: DC

## 2013-01-11 MED ORDER — INSULIN GLARGINE 100 UNIT/ML ~~LOC~~ SOLN
35.0000 [IU] | Freq: Every day | SUBCUTANEOUS | Status: DC
  Administered 2013-01-11: 35 [IU] via SUBCUTANEOUS
  Filled 2013-01-11 (×2): qty 0.35

## 2013-01-11 MED ORDER — OLOPATADINE HCL 0.1 % OP SOLN
1.0000 [drp] | Freq: Two times a day (BID) | OPHTHALMIC | Status: DC | PRN
  Filled 2013-01-11: qty 5

## 2013-01-11 MED ORDER — ASPIRIN EC 81 MG PO TBEC
81.0000 mg | DELAYED_RELEASE_TABLET | Freq: Every day | ORAL | Status: DC
  Administered 2013-01-11 – 2013-01-12 (×2): 81 mg via ORAL
  Filled 2013-01-11 (×2): qty 1

## 2013-01-11 NOTE — Progress Notes (Signed)
NUTRITION FOLLOW UP  Intervention:    No nutrition intervention at this time ---> patient declined RD to follow for nutrition care plan  Nutrition Dx:   Inadequate oral intake now related to poor appetite as evidenced by patient report, ongoing  New Goal:   Oral intake with meals to meet >/= 90% of estimated nutrition needs, progressing  Monitor:   PO intake, weight, labs, I/O's  Assessment:   Patient with hx of chest pain and reportedly normal stress test about 8 yrs ago presented to [facility] ED with several month h/o DOE followed by 1 week h/o unstable angina culminating in rest angina and elevated troponins; s/p cardiac cath 6/4.   Patient s/p procedures 6/6:  CORONARY BYPASS GRAFTING  Patient extubated 6/6 PM.  Diet advanced to Carbohydrate Modified Medium Calorie 6/7.  Reports her appetite is poor.  PO intake 50% per flowsheet records.  RD offered nutrition supplements, however, patient declined.  Height: Ht Readings from Last 1 Encounters:  01/05/13 5\' 2"  (1.575 m)    Weight Status:   Wt Readings from Last 1 Encounters:  01/11/13 131 lb 6.3 oz (59.6 kg)    Re-estimated needs:  Kcal: 1500-1700 Protein: 70-80 gm Fluid: 1.5-1.7 L  Skin: chest & leg surgical incisions   Diet Order: Carb Control   Intake/Output Summary (Last 24 hours) at 01/11/13 1212 Last data filed at 01/11/13 0815  Gross per 24 hour  Intake    480 ml  Output    701 ml  Net   -221 ml    Last BM: 6/10  Labs:   Recent Labs Lab 01/07/13 1900  01/08/13 0400  01/08/13 1550 01/09/13 0417 01/10/13 0342 01/11/13 0940  NA  --   < > 137  < >  --  135 137 136  K  --   < > 4.0  < >  --  3.8 3.8 3.6  CL  --   < > 104  < >  --  101 100 97  CO2  --   --  24  --   --  28 31 30   BUN  --   < > 9  < >  --  11 12 12   CREATININE 0.62  < > 0.64  < > 0.73 0.81 0.78 0.78  CALCIUM  --   --  8.3*  --   --  8.3* 8.5 8.6  MG 3.1*  --  2.3  --  2.2  --   --   --   GLUCOSE  --   < > 105*  < >  --  81 53* 227*   < > = values in this interval not displayed.  CBG (last 3)   Recent Labs  01/10/13 2121 01/11/13 0619 01/11/13 1140  GLUCAP 110* 131* 137*    Scheduled Meds: . acetaminophen  1,000 mg Oral Q6H   Or  . acetaminophen (TYLENOL) oral liquid 160 mg/5 mL  975 mg Per Tube Q6H  . artificial tears   Both Eyes QHS  . aspirin EC  81 mg Oral Daily  . atorvastatin  80 mg Oral q1800  . exenatide  10 mcg Subcutaneous Daily  . furosemide  40 mg Oral Daily  . insulin aspart  0-24 Units Subcutaneous TID AC & HS  . insulin glargine  35 Units Subcutaneous QHS  . insulin regular  0-10 Units Intravenous TID WC  . levothyroxine  88 mcg Oral QAC breakfast  . lisinopril  2.5 mg  Oral Daily  . LORazepam  2 mg Oral QHS  . metFORMIN  500 mg Oral Q breakfast  . metoprolol tartrate  25 mg Oral BID   Or  . metoprolol tartrate  25 mg Per Tube BID  . pantoprazole  40 mg Oral Daily  . potassium chloride  20 mEq Oral Daily  . sodium chloride  3 mL Intravenous Q12H    Continuous Infusions: . sodium chloride      Maureen Chatters, RD, LDN Pager #: [phone] After-Hours Pager #: [phone]

## 2013-01-11 NOTE — Progress Notes (Signed)
Pt c/o SOB, pt lying in bed, ra sat 88%. 2L O2 [place] applied. O2 sat on 2L 96%. Will continue to monitor.

## 2013-01-11 NOTE — Progress Notes (Signed)
Pt ambulating in room this evening, but did not want to go for a walk in the hall due to having diarrhea most of the day.

## 2013-01-11 NOTE — Progress Notes (Signed)
CARDIAC REHAB PHASE I   PRE:  Rate/Rhythm: 91SR  BP:  Supine:   Sitting: 130/72  Standing:    SaO2: 94%RA  MODE:  Ambulation: 350 ft   POST:  Rate/Rhythm: 106ST  BP:  Supine:   Sitting: 142/70  Standing:    SaO2: 94%RA 1120-1145 Pt walked 350 ft on RA with rolling walker and asst x 1 with steady gait. Tolerated well. Tired by end of walk. To recliner with call bell. Second walk today.   Luetta Nutting, RN BSN  01/11/2013 11:42 AM

## 2013-01-11 NOTE — Progress Notes (Signed)
D/C EPW per protocol and as ordered, pt tolerated well, no bleeding/ectopy noted. Last INR 1.42, VSS.  Pt reminded to lie supine approximately one hour, call bell within reach.  Will continue to monitor.

## 2013-01-11 NOTE — Progress Notes (Signed)
Physical Therapy Treatment Patient Details Name: Angela Barrett MRN: [id number] DOB: 08/30/1940 Today's Date: 01/11/2013 Time: 4098-1191 PT Time Calculation (min): 25 min  PT Assessment / Plan / Recommendation Comments on Treatment Session  Pt demonstrates steady progress towards PT goals at this time. Pt with increased ambulation and activity.  Pt did have BM in bed as well as on toilet (black tarry stool, nsg aware).  Will continue to follow and progress as tolerated.     Follow Up Recommendations  Home health PT;Supervision - Intermittent     Does the patient have the potential to tolerate intense rehabilitation     Barriers to Discharge        Equipment Recommendations  None recommended by PT    Recommendations for Other Services OT consult  Frequency Min 3X/week   Plan Discharge plan remains appropriate    Precautions / Restrictions Precautions Precautions: Sternal Precaution Comments: educated pt and family on sternal precautions, positioning, and techniques for adherence Restrictions Weight Bearing Restrictions: Yes (sternal precautions)   Pertinent Vitals/Pain No pain at this time    Mobility  Bed Mobility Bed Mobility: Rolling Right;Right Sidelying to Sit;Sitting - Scoot to Edge of Bed Rolling Right: 4: Min guard Right Sidelying to Sit: 4: Min guard Sitting - Scoot to [place] of Bed: 5: Supervision Details for Bed Mobility Assistance: VCs for technique and positioning Transfers Transfers: Sit to Stand;Stand to Sit Sit to Stand: 5: Supervision Stand to Sit: 5: Supervision Details for Transfer Assistance: Good compliance with sternal precautions, performed from various surfaces. (Various surfaces; toilet; chair and bed) Ambulation/Gait Ambulation/Gait Assistance: 5: Supervision;4: Min guard Ambulation Distance (Feet): 300 Feet Assistive device: Rolling walker Ambulation/Gait Assistance Details: One rest break taken, overall good stability VCs for upright posture  x2 Gait Pattern: Step-through pattern;Decreased stride length;Trunk flexed;Narrow base of support Gait velocity: decreased General Gait Details: pt steady with ambulation using rw Stairs: No    Exercises General Exercises - Lower Extremity Ankle Circles/Pumps: AROM;Both;10 reps Long Arc Quad: AROM;Both;10 reps Straight Leg Raises: AROM;Both;10 reps     PT Goals Acute Rehab PT Goals PT Goal Formulation: With patient/family Time For Goal Achievement: 01/24/13 Potential to Achieve Goals: Good Pt will go Supine/Side to Sit: with supervision PT Goal: Supine/Side to Sit - Progress: Progressing toward goal Pt will go Sit to Stand: with supervision PT Goal: Sit to Stand - Progress: Met Pt will Ambulate: >150 feet;with supervision PT Goal: Ambulate - Progress: Met Pt will Go Up / Down Stairs: 3-5 stairs;with min assist  Visit Information  Last PT Received On: 01/11/13 Assistance Needed: +1    Subjective Data  Subjective: I feel much better today Patient Stated Goal: to go home   Cognition  Cognition Arousal/Alertness: Awake/alert Behavior During Therapy: WFL for tasks assessed/performed Overall Cognitive Status: Within Functional Limits for tasks assessed    Balance  Balance Balance Assessed: Yes Static Standing Balance Static Standing - Balance Support: During functional activity Static Standing - Level of Assistance: 5: Stand by assistance Static Standing - Comment/# of Minutes: 2 minutes at sink; 4 minutes during standing hygiene with assist  End of Session PT - End of Session Equipment Utilized During Treatment: Gait belt Activity Tolerance: Patient tolerated treatment well;Patient limited by fatigue Patient left: in chair;with call bell/phone within reach;Other (comment) (doctor in room) Nurse Communication: Mobility status   GP     Fabio Asa 01/11/2013, 8:54 AM Charlotte Crumb, PT DPT  [phone]

## 2013-01-11 NOTE — Progress Notes (Signed)
4 Days Post-Op Procedure(s) (LRB): CORONARY ARTERY BYPASS GRAFTING (CABG) (N/A) Subjective: C/o tarry stool this AM Walked 350' without difficulty Denies abdominal pain and nausea  Objective: Vital signs in last 24 hours: Temp:  [98.3 F (36.8 C)-99 F (37.2 C)] 98.3 F (36.8 C) (06/10 0456) Pulse Rate:  [81-89] 81 (06/10 0456) Cardiac Rhythm:  [-] Normal sinus rhythm (06/10 0815) Resp:  [17-24] 24 (06/10 0456) BP: (125-162)/(61-78) 162/76 mmHg (06/10 0456) SpO2:  [91 %-95 %] 95 % (06/10 0456) Weight:  [131 lb 6.3 oz (59.6 kg)] 131 lb 6.3 oz (59.6 kg) (06/10 0456)  Hemodynamic parameters for last 24 hours:    Intake/Output from previous day: 06/09 0701 - 06/10 0700 In: 483 [P.O.:480; I.V.:3] Out: 951 [Urine:950; Stool:1] Intake/Output this shift:    General appearance: alert and no distress Neurologic: intact Heart: regular rate and rhythm Lungs: diminished breath sounds bibasilar Abdomen: normal findings: soft, non-tender Wound: clean and dry  Lab Results:  Recent Labs  01/09/13 0417 01/10/13 0342  WBC 12.6* 9.6  HGB 10.2* 10.8*  HCT 29.5* 30.4*  PLT 147* 187   BMET:  Recent Labs  01/09/13 0417 01/10/13 0342  NA 135 137  K 3.8 3.8  CL 101 100  CO2 28 31  GLUCOSE 81 53*  BUN 11 12  CREATININE 0.81 0.78  CALCIUM 8.3* 8.5    PT/INR: No results found for this basename: LABPROT, INR,  in the last 72 hours ABG    Component Value Date/Time   PHART 7.291* 01/07/2013 1939   HCO3 20.9 01/07/2013 1939   TCO2 24 01/08/2013 1545   ACIDBASEDEF 5.0* 01/07/2013 1939   O2SAT 98.0 01/07/2013 1939   CBG (last 3)   Recent Labs  01/10/13 1638 01/10/13 2121 01/11/13 0619  GLUCAP 122* 110* 131*    Assessment/Plan: S/P Procedure(s) (LRB): CORONARY ARTERY BYPASS GRAFTING (CABG) (N/A) - CV- stable in SR- will dc pacing wires  RESP- CXR yesterday- bilateral effusions yesterday- continue diuresis  RENAL- stable  GI- tarry stool, may be from iron, but will check  CBC and also type and screen just to be safe  ENDO- CBG better- resume home regimen   LOS: 6 days    Jacky Dross C 01/11/2013

## 2013-01-12 DIAGNOSIS — I214 Non-ST elevation (NSTEMI) myocardial infarction: Secondary | ICD-10-CM

## 2013-01-12 DIAGNOSIS — E039 Hypothyroidism, unspecified: Secondary | ICD-10-CM

## 2013-01-12 DIAGNOSIS — Z951 Presence of aortocoronary bypass graft: Secondary | ICD-10-CM

## 2013-01-12 DIAGNOSIS — E119 Type 2 diabetes mellitus without complications: Secondary | ICD-10-CM

## 2013-01-12 LAB — GLUCOSE, CAPILLARY: Glucose-Capillary: 112 mg/dL — ABNORMAL HIGH (ref 70–99)

## 2013-01-12 LAB — BASIC METABOLIC PANEL
CO2: 34 mEq/L — ABNORMAL HIGH (ref 19–32)
Calcium: 9.5 mg/dL (ref 8.4–10.5)
Glucose, Bld: 115 mg/dL — ABNORMAL HIGH (ref 70–99)
Sodium: 141 mEq/L (ref 135–145)

## 2013-01-12 LAB — CBC
HCT: 37.1 % (ref 36.0–46.0)
Hemoglobin: 12.5 g/dL (ref 12.0–15.0)
MCH: 30.4 pg (ref 26.0–34.0)
MCV: 90.3 fL (ref 78.0–100.0)
RBC: 4.11 MIL/uL (ref 3.87–5.11)

## 2013-01-12 MED ORDER — POTASSIUM CHLORIDE CRYS ER 20 MEQ PO TBCR: 20.0000 meq | EXTENDED_RELEASE_TABLET | Freq: Every day | ORAL | Status: DC

## 2013-01-12 MED ORDER — OXYCODONE HCL 5 MG PO TABS: 5.0000 mg | ORAL_TABLET | ORAL | Status: DC | PRN

## 2013-01-12 MED ORDER — LISINOPRIL 5 MG PO TABS: 5.0000 mg | ORAL_TABLET | Freq: Every day | ORAL | Status: DC

## 2013-01-12 MED ORDER — LISINOPRIL 5 MG PO TABS
5.0000 mg | ORAL_TABLET | Freq: Every day | ORAL | Status: DC
  Administered 2013-01-12: 5 mg via ORAL
  Filled 2013-01-12: qty 1

## 2013-01-12 MED ORDER — FUROSEMIDE 40 MG PO TABS: 40.0000 mg | ORAL_TABLET | Freq: Every day | ORAL | Status: DC

## 2013-01-12 MED ORDER — ATORVASTATIN CALCIUM 80 MG PO TABS: 80.0000 mg | ORAL_TABLET | Freq: Every day | ORAL | Status: DC

## 2013-01-12 MED ORDER — METOPROLOL TARTRATE 25 MG PO TABS: 25.0000 mg | ORAL_TABLET | Freq: Two times a day (BID) | ORAL | Status: DC

## 2013-01-12 NOTE — Progress Notes (Addendum)
      [address]       [place] [address]             [phone]      5 Days Post-Op Procedure(s) (LRB): CORONARY ARTERY BYPASS GRAFTING (CABG) (N/A) Subjective:  [name] is feeling much better.  He diarrhea and nausea has resolved.  She states she had a normal BM yesterday that was of normal color.    Objective: Vital signs in last 24 hours: Temp:  [98.3 F (36.8 C)-98.5 F (36.9 C)] 98.3 F (36.8 C) (06/11 0404) Pulse Rate:  [81-98] 81 (06/11 0404) Cardiac Rhythm:  [-] Normal sinus rhythm (06/11 0732) Resp:  [16-18] 18 (06/11 0404) BP: (145-153)/(59-97) 146/97 mmHg (06/11 0404) SpO2:  [88 %-98 %] 97 % (06/11 0404) Weight:  [128 lb 4.8 oz (58.196 kg)] 128 lb 4.8 oz (58.196 kg) (06/11 0500)  Intake/Output from previous day: 06/10 0701 - 06/11 0700 In: 723 [P.O.:720; I.V.:3] Out: -   General appearance: alert, cooperative and no distress Neurologic: intact Heart: regular rate and rhythm Lungs: clear to auscultation bilaterally Abdomen: soft, non-tender; bowel sounds normal; no masses,  no organomegaly Extremities: edema trace Wound: clean and dry  Lab Results:  Recent Labs  01/11/13 0940 01/12/13 0424  WBC 9.2 10.0  HGB 11.8* 12.5  HCT 34.6* 37.1  PLT 286 341   BMET:  Recent Labs  01/11/13 0940 01/12/13 0424  NA 136 141  K 3.6 4.0  CL 97 98  CO2 30 34*  GLUCOSE 227* 115*  BUN 12 14  CREATININE 0.78 0.80  CALCIUM 8.6 9.5    PT/INR: No results found for this basename: LABPROT, INR,  in the last 72 hours ABG    Component Value Date/Time   PHART 7.291* 01/07/2013 1939   HCO3 20.9 01/07/2013 1939   TCO2 24 01/08/2013 1545   ACIDBASEDEF 5.0* 01/07/2013 1939   O2SAT 98.0 01/07/2013 1939   CBG (last 3)   Recent Labs  01/11/13 1616 01/11/13 2058 01/12/13 0604  GLUCAP 141* 175* 112*    Assessment/Plan: S/P Procedure(s) (LRB): CORONARY ARTERY BYPASS GRAFTING (CABG) (N/A)  1. NSR, rate controlled, pressure elevated will increase  Lisinopril to 5 mg 2. Pulm- no acute issues, off oxygen, continue IS 3. Renal- creatinine WNL, weight is below baseline will continue short course of Lasix 4. Tarry stools- resolved, H/H stable at 12.5- on iron 5. DM- CBGs controlled 6. Dispo- patient feeling much better, wants to go home today, will discuss with staff   LOS: 7 days    Angela Barrett 01/12/2013  Patient seen and examined. Agree with above  Dc home today

## 2013-01-12 NOTE — Progress Notes (Signed)
Physical Therapy Treatment Patient Details Name: Angela Barrett MRN: [id number] DOB: Jul 20, 1941 Today's Date: 01/12/2013 Time: 1884-1660 PT Time Calculation (min): 24 min  PT Assessment / Plan / Recommendation Comments on Treatment Session  Pt demonstrates steady progress towards PT goals at this time. Pt with increased ambulation and activity.  Pt did have BM in bed as well as on toilet (black tarry stool, nsg aware).  Will continue to follow and progress as tolerated.     Follow Up Recommendations  Home health PT;Supervision - Intermittent     Does the patient have the potential to tolerate intense rehabilitation     Barriers to Discharge        Equipment Recommendations  None recommended by PT    Recommendations for Other Services OT consult  Frequency Min 3X/week   Plan Discharge plan remains appropriate    Precautions / Restrictions Precautions Precautions: Sternal Precaution Comments: educated pt and family on sternal precautions, positioning, and techniques for adherence   Pertinent Vitals/Pain No pain at this time    Mobility  Transfers Transfers: Sit to Stand;Stand to Sit Sit to Stand: 5: Supervision Stand to Sit: 5: Supervision Details for Transfer Assistance: Good compliance with sternal precautions, performed from various surfaces. (Various surfaces; toilet; chair and bed) Ambulation/Gait Ambulation/Gait Assistance: 5: Supervision;4: Min guard Ambulation Distance (Feet): 440 Feet Assistive device: Rolling walker Ambulation/Gait Assistance Details: Steady with gait Gait Pattern: Step-through pattern;Decreased stride length;Trunk flexed;Narrow base of support Gait velocity: decreased General Gait Details: pt steady with ambulation using rw Stairs: Yes Stairs Assistance: 4: Min guard Stair Management Technique: One rail Right;Step to pattern;Forwards Number of Stairs: 6     PT Goals Acute Rehab PT Goals PT Goal Formulation: With patient/family Time For  Goal Achievement: 01/24/13 Potential to Achieve Goals: Good Pt will go Supine/Side to Sit: with supervision PT Goal: Supine/Side to Sit - Progress: Progressing toward goal Pt will go Sit to Stand: with supervision PT Goal: Sit to Stand - Progress: Met Pt will Ambulate: >150 feet;with supervision PT Goal: Ambulate - Progress: Met Pt will Go Up / Down Stairs: 3-5 stairs;with min assist PT Goal: Up/Down Stairs - Progress: Met  Visit Information  Last PT Received On: 01/12/13 Assistance Needed: +1    Subjective Data  Subjective: I am ready to go home Patient Stated Goal: to go home   Cognition  Cognition Arousal/Alertness: Awake/alert Behavior During Therapy: WFL for tasks assessed/performed Overall Cognitive Status: Within Functional Limits for tasks assessed    Balance   steady with activity  End of Session PT - End of Session Equipment Utilized During Treatment: Gait belt Activity Tolerance: Patient tolerated treatment well Patient left: in chair;with call bell/phone within reach;Other (comment) Nurse Communication: Mobility status   GP     Fabio Asa 01/12/2013, 12:43 PM Charlotte Crumb, PT DPT  [phone]

## 2013-01-12 NOTE — Progress Notes (Signed)
[id number] Education completed with pt and daughter. Understanding voiced. Discussed CRP 2 and permission given to refer to [facility] Phase 2. Pt walked with daughter earlier with hand held asst. Luetta Nutting RNBSN

## 2013-01-12 NOTE — Progress Notes (Signed)
D/C CTS per protocol and as ordered, steris applied, pt tolerated well.  Will continue to monitor,

## 2013-01-12 NOTE — Discharge Summary (Signed)
Physician Discharge Summary  Patient ID: Angela Barrett MRN: [id number] DOB/AGE: [birth year] 72 y.o.  Admit date: 01/05/2013 Discharge date: 01/12/2013  Admission Diagnoses:  Patient Active Problem List   Diagnosis Date Noted  . NSTEMI (non-ST elevated myocardial infarction) 01/12/2013  . Diabetes 01/12/2013  . Unspecified hypothyroidism 01/12/2013  . Lymphocytosis 03/24/2012   Discharge Diagnoses:   Patient Active Problem List   Diagnosis Date Noted  . NSTEMI (non-ST elevated myocardial infarction) 01/12/2013  . 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 Diabetes 01/12/2013  . Unspecified hypothyroidism 01/12/2013  . Lymphocytosis 03/24/2012   Discharged Condition: good  History of Present Illness:   [name] is a 72 yo [place] female with known history of DM.  She underwent cardiac work up in 2006 which had a negative.  The patient developed chest pain about 1 week ago.  She described the pain as a pressure/burning sensation with exertion.  She also states she gets pain in her hands.  The pain initially resolved with rest, however over the past week the pain was occuring during any exertion.  This gradually progressed as the week went on prompting her to present to the Emergency Department.  EKG did not show evidence of ST changes and her Troponin was positive.  She was ruled in for NSTEMI and admitted for further workup.  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  It was felt the patient would best be treated with Coronary Bypass Grafting.  TCTS was consulted and the patient was evaluated by Dr. Dorris Fetch.  She remained chest pain free and it was felt that some her disease could be treated with PCI.  However, being diabetic it was felt that Coronary Bypass would be the best treatment option.  The risks and benefits of the procedure were explained to the patient and she was agreeable to proceed.  She was taken to the  operating room on 01/07/2013.  She underwent CABG x 4 utilizing LIMA to LAD, SVG to Dx, and a sequential SVG to OM3 and Right PLVB.  She also underwent open harvest of the Saphenous vein from her right leg.  She tolerated the procedure well and was taken to the SICU in stable condition.  The patient was extubated the evening of surgery.  During her stay in the ICU she was weaned off all cardiac drips.  She was transitioned off an insulin drip and placed on Levemir to blood sugar coverage.  Her chest tubes and arterial lines were removed without difficulty.  Once medically stable the patient was transferred to the step down unit.  The patient was weaned off her temporary pacemaker as her heart rhythm tolerated.  She is maintaining NSR and her pacing wires have been removed.  The patient developed nausea and diarrhea with Tarry Stools.  This has resolved and her H/H is stable at 12.5 mg.  The patient is on iron supplementation which was the most likely source for the dark appearance of her stools.  She is ambulating without difficulty.  Her blood sugars are well controlled and she is tolerating a diabetic diet.  She will be discharged home today.  She will follow up with Dr. Dorris Fetch in 3 weeks with a CXR prior to her appointment.  She will also need to follow up with Dr. Kirke Corin in 2-4 weeks time.   Consults: cardiology  Significant Diagnostic Studies:   Hemodynamics:  AO: 118/52 mmHg  LV:  121/5 mmHg  LVEDP: 11 mmHg  Coronary angiography:  Coronary dominance: right  Left Main: Normal in size with 20% distal disease.  Left anterior descending artery: Normal in size and heavily calcified in the proximal segment. There is a 60% proximal stenosis. In the midsegment, there is diffuse 50-60% disease and significant remodeling and the vessel. There is a 70-80% lesion distally.  1st diagonal (D1): Normal in size with 90% proximal disease.  2nd diagonal (D2): Very small in size.  3rd diagonal (D3): Very small  in size.  Circumflex (LCx): Normal in size and nondominant. The vessel is mildly calcified. There is 95% diffuse disease in the midsegment. This is likely the culprit for NSTEMI.  1st obtuse marginal: Small in size with minor irregularities.  2nd obtuse marginal: Normal in size with 20% diffuse disease proximally.  3rd obtuse marginal: Normal in size with minor irregularities.  Right Coronary Artery: Normal in size and dominant. There is diffuse 40% disease proximally. There is an 80% discrete lesion in the midsegment. The rest of the vessel has minor irregularities.  Posterior descending artery: Medium in size with no significant disease.  Posterior AV segment: Normal in size with diffuse 20% disease.  Posterolateral branchs: 40% disease proximally. Left ventriculography: Left ventricular systolic function is low normal , LVEF is estimated at 50 %, there is no significant mitral regurgitation   Treatments: surgery:   Median sternotomy, extracorporeal circulation, coronary  artery bypass grafting x4 (left internal mammary artery to left anterior  descending, saphenous vein graft to first diagonal, sequential saphenous  vein graft to obtuse marginal 3 and right posterolateral branch) open  vein harvest of right leg.  Disposition: Home  Discharge Medications:     Medication List    TAKE these medications       artificial tears ointment  Place 1 application into both eyes at bedtime.     aspirin EC 81 MG tablet  Take 81 mg by mouth 2 (two) times daily.     atorvastatin 80 MG tablet  Commonly known as:  LIPITOR  Take 1 tablet (80 mg total) by mouth daily at 6 PM.     exenatide 10 MCG/0.04ML Sopn  Commonly known as:  BYETTA  Inject 10 mcg into the skin daily.     furosemide 40 MG tablet  Commonly known as:  LASIX  Take 1 tablet (40 mg total) by mouth daily. For 7 Days     glucosamine-chondroitin 500-400 MG tablet  Take 1 tablet by mouth every morning.     insulin glargine  100 UNIT/ML injection  Commonly known as:  LANTUS  Inject 35 Units into the skin at bedtime.     levothyroxine 88 MCG tablet  Commonly known as:  SYNTHROID, LEVOTHROID  Take 88 mcg by mouth daily before breakfast.     lisinopril 5 MG tablet  Commonly known as:  PRINIVIL,ZESTRIL  Take 1 tablet (5 mg total) by mouth daily.     LORazepam 1 MG tablet  Commonly known as:  ATIVAN  Take 2.5 mg by mouth at bedtime.     metFORMIN 500 MG 24 hr tablet  Commonly known as:  GLUCOPHAGE-XR  Take 500 mg by mouth daily with breakfast.     metoprolol tartrate 25 MG tablet  Commonly known as:  LOPRESSOR  Take 1 tablet (25 mg total) by mouth 2 (two) times daily.     oxyCODONE 5 MG immediate release tablet  Commonly known as:  Oxy IR/ROXICODONE  Take 1  tablet (5 mg total) by mouth every 4 (four) hours as needed.     pantoprazole 40 MG tablet  Commonly known as:  PROTONIX  Take 40 mg by mouth daily at 2 PM daily at 2 PM.     potassium chloride SA 20 MEQ tablet  Commonly known as:  K-DUR,KLOR-CON  Take 1 tablet (20 mEq total) by mouth daily. For 7 Days     THERA TEARS ALLERGY 0.025 % ophthalmic solution  Generic drug:  ketotifen  Place 1 drop into both eyes as needed (for allergy eyes.).        The patient has been discharged on:   1.Beta Blocker:  Yes [  x ]                              No   [   ]                              If No, reason:  2.Ace Inhibitor/ARB: Yes [x   ]                                     No  [    ]                                     If No, reason:  3.Statin:   Yes [x   ]                  No  [   ]                  If No, reason:  4.Ecasa:  Yes  [ x  ]                  No   [   ]                  If No, reason:     Signed: Vishal Sandlin 01/12/2013, 8:17 AM

## 2013-01-20 ENCOUNTER — Ambulatory Visit (INDEPENDENT_AMBULATORY_CARE_PROVIDER_SITE_OTHER): Payer: Medicare Other | Admitting: Cardiovascular Disease

## 2013-01-20 ENCOUNTER — Encounter: Payer: Self-pay | Admitting: Cardiovascular Disease

## 2013-01-20 VITALS — BP 102/68 | HR 93 | Ht 63.0 in | Wt 129.2 lb

## 2013-01-20 DIAGNOSIS — R0789 Other chest pain: Secondary | ICD-10-CM | POA: Diagnosis not present

## 2013-01-20 DIAGNOSIS — Z951 Presence of aortocoronary bypass graft: Secondary | ICD-10-CM

## 2013-01-20 DIAGNOSIS — E785 Hyperlipidemia, unspecified: Secondary | ICD-10-CM | POA: Diagnosis not present

## 2013-01-20 MED ORDER — OXYCODONE-ACETAMINOPHEN 7.5-325 MG PO TABS: 1.0000 | ORAL_TABLET | ORAL | Status: DC | PRN

## 2013-01-20 NOTE — Assessment & Plan Note (Signed)
She seems to be doing reasonably well. Her resting heart rate is somewhat on the high side. She also has been having palpitations in the evening. This seems to be due to the fact that she is taking metoprolol once daily instead of twice daily. Otherwise, she has no signs of bleeding she has been drinking enough fluids. I asked her to change metoprolol to 25 mg twice daily. She does seem to have residual left-sided pleural effusion. If her symptoms do not improve, I asked her to call us in likely resume a small dose Lasix. I gave her a one-time prescription for Percocet as she is still having discomfort at the surgical site.  I will obtain an echocardiogram in the next few months to reevaluate his LV systolic function.

## 2013-01-20 NOTE — Patient Instructions (Addendum)
Make sure you take Metoprolol twice Continue other medications.  Follow up 1 month

## 2013-01-20 NOTE — Progress Notes (Signed)
HPI  This is a 72 year old female who is here today for a followup visit after recent hospitalization at [facility]. She has known history of diabetes and hypertension. She presented in early June with non-ST elevation myocardial infarction. She underwent cardiac catheterization which showed significant three-vessel coronary artery disease with an ejection fraction of 45-50%. She underwent CABG by Dr. Dorris Fetch without complications. She has been doing reasonably well since hospital discharge. She complains of palpitations at night but it seems that she has been taking metoprolol only once daily in the morning. Her palpitations at night associated with orthopnea. She still has some discomfort that surgical site. She feels that oxycodone is too strong. She used Percocet in the past with better response. She is done taking furosemide.  Allergies  Allergen Reactions  . Amitriptyline Other (See Comments)    disoriented  . Cortisone Other (See Comments)    Eyes hurt and sugar goes up  . Macrodantin (Nitrofurantoin) Itching and Swelling  . Motrin (Ibuprofen) Other (See Comments)    Fluid retention     Current Outpatient Prescriptions on File Prior to Visit  Medication Sig Dispense Refill  . Artificial Tear Ointment (ARTIFICIAL TEARS) ointment Place 1 application into both eyes at bedtime.      Marland Kitchen atorvastatin (LIPITOR) 80 MG tablet Take 1 tablet (80 mg total) by mouth daily at 6 PM.  30 tablet  3  . exenatide (BYETTA) 10 MCG/0.04ML SOPN Inject 10 mcg into the skin daily.      Marland Kitchen glucosamine-chondroitin 500-400 MG tablet Take 1 tablet by mouth every morning.      . insulin glargine (LANTUS) 100 UNIT/ML injection Inject 35 Units into the skin at bedtime.      Marland Kitchen ketotifen (THERA TEARS ALLERGY) 0.025 % ophthalmic solution Place 1 drop into both eyes as needed (for allergy eyes.).      Marland Kitchen levothyroxine (SYNTHROID, LEVOTHROID) 88 MCG tablet Take 88 mcg by mouth daily before breakfast.        . lisinopril (PRINIVIL,ZESTRIL) 5 MG tablet Take 1 tablet (5 mg total) by mouth daily.  30 tablet  3  . LORazepam (ATIVAN) 1 MG tablet Take 2.5 mg by mouth at bedtime.      . metFORMIN (GLUCOPHAGE-XR) 500 MG 24 hr tablet Take 500 mg by mouth daily with breakfast.      . metoprolol tartrate (LOPRESSOR) 25 MG tablet Take 1 tablet (25 mg total) by mouth 2 (two) times daily.  60 tablet  3  . pantoprazole (PROTONIX) 40 MG tablet Take 40 mg by mouth daily at 2 PM daily at 2 PM.       No current facility-administered medications on file prior to visit.     Past Medical History  Diagnosis Date  . Depression   . Hypothyroidism   . GERD (gastroesophageal reflux disease)   . Diabetes mellitus, type 2   . IBS (irritable bowel syndrome)   . OA (osteoarthritis)   . Lymphocytosis 03/24/2012  . PONV (postoperative nausea and vomiting)   . Chest pain     a. nl myoview ~ 2006.     Past Surgical History  Procedure Laterality Date  . Thyroidectomy  2010  . Cardiac catheterization      [facility]  . Coronary artery bypass graft N/A 01/07/2013    Procedure: CORONARY ARTERY BYPASS GRAFTING (CABG);  Surgeon: Loreli Slot, MD;  Location: [facility] OR;  Service: Open Heart Surgery;  Laterality: N/A;     Family  History  Problem Relation Age of Onset  . Cancer Mother     bone CA, died @ 77  . Emphysema Father     died @ 30  . Emphysema Brother     died @ 19  . Diabetes Son     alive and well.     History   Social History  . Marital Status: Married    Spouse Name: N/A    Number of Children: N/A  . Years of Education: N/A   Occupational History  . Not on file.   Social History Main Topics  . Smoking status: Never Smoker   . Smokeless tobacco: Never Used  . Alcohol Use: No  . Drug Use: No  . Sexually Active: No   Other Topics Concern  . Not on file   Social History Narrative   Lives in [place] with husband.     PHYSICAL EXAM   BP 102/68  Pulse 93  Ht 5\' 3"  (1.6 m)  Wt 129  lb 4 oz (58.627 kg)  BMI 22.9 kg/m2 Constitutional: She is oriented to person, place, and time. She appears well-developed and well-nourished. No distress.  HENT: No nasal discharge.  Head: Normocephalic and atraumatic.  Eyes: Pupils are equal and round. Right eye exhibits no discharge. Left eye exhibits no discharge.  Neck: Normal range of motion. Neck supple. No JVD present. No thyromegaly present.  Cardiovascular: Normal rate, regular rhythm, normal heart sounds. Exam reveals no gallop and no friction rub. No murmur heard.  Pulmonary/Chest: Effort normal and decreased breath sounds at the left base. No stridor. No respiratory distress. She has no wheezes. She has no rales. She exhibits no tenderness.  Abdominal: Soft. Bowel sounds are normal. She exhibits no distension. There is no tenderness. There is no rebound and no guarding.  Musculoskeletal: Normal range of motion. She exhibits no edema and no tenderness.  Neurological: She is alert and oriented to person, place, and time. Coordination normal.  Skin: Skin is warm and dry. No rash noted. She is not diaphoretic. No erythema. No pallor.  Psychiatric: She has a normal mood and affect. Her behavior is normal. Judgment and thought content normal.     NWG:NFAOZ  Rhythm  -Anterior lead misplacement  -Nonspecific ST changes  ABNORMAL    ASSESSMENT AND PLAN

## 2013-01-20 NOTE — Assessment & Plan Note (Signed)
Lab Results  Component Value Date   CHOL 229* 01/06/2013   HDL 37* 01/06/2013   LDLCALC 164* 01/06/2013   TRIG 140 01/06/2013   CHOLHDL 6.2 01/06/2013   Continue treatment with high dose atorvastatin. She will need a followup lipid and liver profile in one month.

## 2013-02-07 ENCOUNTER — Other Ambulatory Visit: Payer: Self-pay | Admitting: *Deleted

## 2013-02-07 DIAGNOSIS — I2510 Atherosclerotic heart disease of native coronary artery without angina pectoris: Secondary | ICD-10-CM

## 2013-02-08 ENCOUNTER — Ambulatory Visit: Payer: Medicare Other | Admitting: Thoracic Surgery (Cardiothoracic Vascular Surgery)

## 2013-02-08 ENCOUNTER — Ambulatory Visit (INDEPENDENT_AMBULATORY_CARE_PROVIDER_SITE_OTHER): Payer: Self-pay | Admitting: Thoracic Surgery (Cardiothoracic Vascular Surgery)

## 2013-02-08 ENCOUNTER — Ambulatory Visit
Admission: RE | Admit: 2013-02-08 | Discharge: 2013-02-08 | Disposition: A | Discharge status: Home / Self Care | Payer: Medicare Other | Source: Ambulatory Visit | Attending: Thoracic Surgery (Cardiothoracic Vascular Surgery) | Admitting: Thoracic Surgery (Cardiothoracic Vascular Surgery)

## 2013-02-08 ENCOUNTER — Encounter: Payer: Self-pay | Admitting: Thoracic Surgery (Cardiothoracic Vascular Surgery)

## 2013-02-08 VITALS — BP 125/74 | HR 71 | Resp 16 | Ht 63.0 in | Wt 124.0 lb

## 2013-02-08 DIAGNOSIS — J9819 Other pulmonary collapse: Secondary | ICD-10-CM | POA: Diagnosis not present

## 2013-02-08 DIAGNOSIS — Z09 Encounter for follow-up examination after completed treatment for conditions other than malignant neoplasm: Secondary | ICD-10-CM

## 2013-02-08 DIAGNOSIS — I2510 Atherosclerotic heart disease of native coronary artery without angina pectoris: Secondary | ICD-10-CM

## 2013-02-08 DIAGNOSIS — I219 Acute myocardial infarction, unspecified: Secondary | ICD-10-CM

## 2013-02-08 DIAGNOSIS — Z951 Presence of aortocoronary bypass graft: Secondary | ICD-10-CM

## 2013-02-08 NOTE — Progress Notes (Signed)
HPI:  Angela Barrett returns for a scheduled postoperative followup visit. She coronary bypass grafting x4 on June 6. Postoperatively she had some difficulty with diarrhea. However once that resolved she did well.  She says she's having minimal discomfort. She is taking Tylenol arthritis for arthritis in her hands, but is not requiring any other pain medication. She says she did get short of breath at the end of a 10 a walk outside yesterday. She's not had any anginal-type pain.  Past Medical History  Diagnosis Date  . Depression   . Hypothyroidism   . GERD (gastroesophageal reflux disease)   . Diabetes mellitus, type 2   . IBS (irritable bowel syndrome)   . OA (osteoarthritis)   . Lymphocytosis 03/24/2012  . PONV (postoperative nausea and vomiting)   . Chest pain     a. nl myoview ~ 2006.  Marland Kitchen Hyperlipidemia       Current Outpatient Prescriptions  Medication Sig Dispense Refill  . acetaminophen (TYLENOL) 650 MG CR tablet Take 1,300 mg by mouth daily.      . Artificial Tear Ointment (ARTIFICIAL TEARS) ointment Place 1 application into both eyes at bedtime.      Marland Kitchen aspirin 325 MG tablet Take 325 mg by mouth daily.      Marland Kitchen atorvastatin (LIPITOR) 80 MG tablet Take 1 tablet (80 mg total) by mouth daily at 6 PM.  30 tablet  3  . insulin glargine (LANTUS) 100 UNIT/ML injection Inject 35 Units into the skin at bedtime.      Marland Kitchen ketotifen (THERA TEARS ALLERGY) 0.025 % ophthalmic solution Place 1 drop into both eyes as needed (for allergy eyes.).      Marland Kitchen levothyroxine (SYNTHROID, LEVOTHROID) 88 MCG tablet Take 88 mcg by mouth daily before breakfast.      . lisinopril (PRINIVIL,ZESTRIL) 5 MG tablet Take 1 tablet (5 mg total) by mouth daily.  30 tablet  3  . LORazepam (ATIVAN) 1 MG tablet Take 2.5 mg by mouth at bedtime.      . metFORMIN (GLUCOPHAGE-XR) 500 MG 24 hr tablet Take 500 mg by mouth daily with breakfast.      . metoprolol tartrate (LOPRESSOR) 25 MG tablet Take 1 tablet (25 mg total) by mouth  2 (two) times daily.  60 tablet  3  . oxyCODONE-acetaminophen (PERCOCET) 7.5-325 MG per tablet Take 1 tablet by mouth every 4 (four) hours as needed for pain.  40 tablet  0  . pantoprazole (PROTONIX) 40 MG tablet Take 40 mg by mouth daily at 2 PM daily at 2 PM.       No current facility-administered medications for this visit.    Physical Exam BP 125/74  Pulse 71  Resp 16  Ht 5\' 3"  (1.6 m)  Wt 124 lb (56.246 kg)  BMI 21.97 kg/m2  SpO57 37% 72 year old woman in no acute distress Neurologic alert and oriented x3 with no focal deficits Lungs clear with equal breath sounds bilaterally Cardiac regular rate and rhythm normal S1 and S2 Sternum stable, incision clean and intact Leg incisions healing well with minimal eschar formation on right lower leg incision  Diagnostic Tests: Chest x-ray 02/08/2013 Shows postoperative changes with no significant effusions or infiltrates  Impression: Angela Barrett is a 72 year old woman who is now about a month out from coronary bypass grafting. She is doing well at this point in time. She is exercising on regular basis. She's having minimal discomfort related to her incisions. She is anxious to increase her activities.  I advised her not to lift anything over 10 pounds for another 2 weeks. Beyond that she can begin to gradually increase her upper body activity. She may begin driving a limited basis, appropriate precautions were discussed.  She has been followed Angela Barrett coming up in about a week. She will discuss cardiac rehabilitation with him at that time, but I think she began that whenever is convenient.   Plan:  She will followup with Angela Barrett and Angela Barrett  I will be happy to see her back any time if I can be of any further assistance with her care.

## 2013-02-10 DIAGNOSIS — H40039 Anatomical narrow angle, unspecified eye: Secondary | ICD-10-CM | POA: Diagnosis not present

## 2013-02-10 DIAGNOSIS — H04129 Dry eye syndrome of unspecified lacrimal gland: Secondary | ICD-10-CM | POA: Diagnosis not present

## 2013-02-17 ENCOUNTER — Encounter: Payer: Self-pay | Admitting: Cardiovascular Disease

## 2013-02-17 ENCOUNTER — Ambulatory Visit (INDEPENDENT_AMBULATORY_CARE_PROVIDER_SITE_OTHER): Payer: Medicare Other | Admitting: Cardiovascular Disease

## 2013-02-17 VITALS — BP 118/74 | HR 86 | Ht 63.0 in | Wt 128.5 lb

## 2013-02-17 DIAGNOSIS — E785 Hyperlipidemia, unspecified: Secondary | ICD-10-CM | POA: Diagnosis not present

## 2013-02-17 DIAGNOSIS — R0789 Other chest pain: Secondary | ICD-10-CM | POA: Diagnosis not present

## 2013-02-17 DIAGNOSIS — Z951 Presence of aortocoronary bypass graft: Secondary | ICD-10-CM

## 2013-02-17 MED ORDER — LISINOPRIL 5 MG PO TABS: 5.0000 mg | ORAL_TABLET | Freq: Every day | ORAL | Status: DC

## 2013-02-17 MED ORDER — METOPROLOL TARTRATE 25 MG PO TABS: 25.0000 mg | ORAL_TABLET | Freq: Two times a day (BID) | ORAL | Status: DC

## 2013-02-17 MED ORDER — ATORVASTATIN CALCIUM 20 MG PO TABS: 20.0000 mg | ORAL_TABLET | Freq: Every day | ORAL | Status: DC

## 2013-02-17 MED ORDER — OXYCODONE-ACETAMINOPHEN 7.5-325 MG PO TABS: 1.0000 | ORAL_TABLET | ORAL | Status: DC | PRN

## 2013-02-17 NOTE — Progress Notes (Signed)
HPI  This is a 72 year old female who is here today for a followup regarding coronary artery disease status post CABG.  She has known history of diabetes and hypertension. She presented in early June with non-ST elevation myocardial infarction. She underwent cardiac catheterization which showed significant three-vessel coronary artery disease with an ejection fraction of 45-50%. She underwent CABG by Dr. Dorris Fetch without complications. Postop palpitations improved after increasing the dose of metoprolol. She started increasing his physical activities and felt well for a few weeks. However, over the last 2 weeks she has experienced significant increase in exertional dyspnea as well as chest discomfort which has been happening with activities as well. It's difficult for her to distinguish if this is related to the surgical scar. She feels tired and has no energy. She continues to have significant discomfort at the scar requiring Percocet. She complains of generalized fatigue. She mentions that she felt this way in the past when she was on atorvastatin.  Allergies  Allergen Reactions  . Amitriptyline Other (See Comments)    disoriented  . Cortisone Other (See Comments)    Eyes hurt and sugar goes up  . Macrodantin (Nitrofurantoin) Itching and Swelling  . Motrin (Ibuprofen) Other (See Comments)    Fluid retention     Current Outpatient Prescriptions on File Prior to Visit  Medication Sig Dispense Refill  . Artificial Tear Ointment (ARTIFICIAL TEARS) ointment Place 1 application into both eyes at bedtime.      Marland Kitchen aspirin 325 MG tablet Take 325 mg by mouth daily.      Marland Kitchen atorvastatin (LIPITOR) 80 MG tablet Take 1 tablet (80 mg total) by mouth daily at 6 PM.  30 tablet  3  . insulin glargine (LANTUS) 100 UNIT/ML injection Inject 35 Units into the skin at bedtime.      Marland Kitchen ketotifen (THERA TEARS ALLERGY) 0.025 % ophthalmic solution Place 1 drop into both eyes as needed (for allergy eyes.).      Marland Kitchen  levothyroxine (SYNTHROID, LEVOTHROID) 88 MCG tablet Take 88 mcg by mouth daily before breakfast.      . lisinopril (PRINIVIL,ZESTRIL) 5 MG tablet Take 1 tablet (5 mg total) by mouth daily.  30 tablet  3  . LORazepam (ATIVAN) 1 MG tablet Take 2.5 mg by mouth at bedtime.      . metFORMIN (GLUCOPHAGE-XR) 500 MG 24 hr tablet Take 500 mg by mouth daily with breakfast.      . metoprolol tartrate (LOPRESSOR) 25 MG tablet Take 1 tablet (25 mg total) by mouth 2 (two) times daily.  60 tablet  3  . oxyCODONE-acetaminophen (PERCOCET) 7.5-325 MG per tablet Take 1 tablet by mouth every 4 (four) hours as needed for pain.  40 tablet  0  . pantoprazole (PROTONIX) 40 MG tablet Take 40 mg by mouth daily at 2 PM daily at 2 PM.       No current facility-administered medications on file prior to visit.     Past Medical History  Diagnosis Date  . Depression   . Hypothyroidism   . GERD (gastroesophageal reflux disease)   . Diabetes mellitus, type 2   . IBS (irritable bowel syndrome)   . OA (osteoarthritis)   . Lymphocytosis 03/24/2012  . PONV (postoperative nausea and vomiting)   . Chest pain     a. nl myoview ~ 2006.  Marland Kitchen Hyperlipidemia      Past Surgical History  Procedure Laterality Date  . Thyroidectomy  2010  . Coronary artery bypass  graft N/A 01/07/2013    Procedure: CORONARY ARTERY BYPASS GRAFTING (CABG);  Surgeon: Loreli Slot, MD;  Location: [facility] OR;  Service: Open Heart Surgery;  Laterality: N/A;  . Cardiac catheterization      [facility]     Family History  Problem Relation Age of Onset  . Cancer Mother     bone CA, died @ 85  . Emphysema Father     died @ 85  . Emphysema Brother     died @ 76  . Diabetes Son     alive and well.     History   Social History  . Marital Status: Married    Spouse Name: N/A    Number of Children: N/A  . Years of Education: N/A   Occupational History  . Not on file.   Social History Main Topics  . Smoking status: Never Smoker   . Smokeless  tobacco: Never Used  . Alcohol Use: No  . Drug Use: No  . Sexually Active: No   Other Topics Concern  . Not on file   Social History Narrative   Lives in [place] with husband.     PHYSICAL EXAM   BP 118/74  Pulse 86  Ht 5\' 3"  (1.6 m)  Wt 128 lb 8 oz (58.287 kg)  BMI 22.77 kg/m2 Constitutional: She is oriented to person, place, and time. She appears well-developed and well-nourished. No distress.  HENT: No nasal discharge.  Head: Normocephalic and atraumatic.  Eyes: Pupils are equal and round. Right eye exhibits no discharge. Left eye exhibits no discharge.  Neck: Normal range of motion. Neck supple. No JVD present. No thyromegaly present.  Cardiovascular: Normal rate, regular rhythm, normal heart sounds. Exam reveals no gallop and no friction rub. No murmur heard.  Pulmonary/Chest: Effort normal and decreased breath sounds at the left base. No stridor. No respiratory distress. She has no wheezes. She has no rales. She exhibits no tenderness.  Abdominal: Soft. Bowel sounds are normal. She exhibits no distension. There is no tenderness. There is no rebound and no guarding.  Musculoskeletal: Normal range of motion. She exhibits no edema and no tenderness.  Neurological: She is alert and oriented to person, place, and time. Coordination normal.  Skin: Skin is warm and dry. No rash noted. She is not diaphoretic. No erythema. No pallor.  Psychiatric: She has a normal mood and affect. Her behavior is normal. Judgment and thought content normal.     [id number]:[id number]  Rhythm  WITHIN NORMAL LIMITS    ASSESSMENT AND PLAN

## 2013-02-17 NOTE — Patient Instructions (Addendum)
Decrease Atorvastatin to 20 mg daily.   Your physician has requested that you have en exercise stress myoview. For further information please visit [email or web address] Please follow instruction sheet, as given.  Do not take Metoprolol the morning of stress test.   Follow up in 3 months.   [facility] MYOVIEW  Your caregiver has ordered a Stress Test with nuclear imaging. The purpose of this test is to evaluate the blood supply to your heart muscle. This procedure is referred to as a "Non-Invasive Stress Test." This is because other than having an IV started in your vein, nothing is inserted or "invades" your body. Cardiac stress tests are done to find areas of poor blood flow to the heart by determining the extent of coronary artery disease (CAD). Some patients exercise on a treadmill, which naturally increases the blood flow to your heart, while others who are  unable to walk on a treadmill due to physical limitations have a pharmacologic/chemical stress agent called Lexiscan . This medicine will mimic walking on a treadmill by temporarily increasing your coronary blood flow.   Please note: these test may take anywhere between 2-4 hours to complete  PLEASE REPORT TO [facility] MEDICAL MALL ENTRANCE  THE VOLUNTEERS AT THE FIRST DESK WILL DIRECT YOU WHERE TO GO  Date of Procedure:__________7/28/14___________________________  Arrival Time for Procedure:_______9:30am_______________________  Instructions regarding medication:   __X__ : Hold diabetes medication morning of procedure- Metformin  _X___:  Hold betablocker(s) night before procedure and morning of procedure   PLEASE NOTIFY THE OFFICE AT LEAST 24 HOURS IN ADVANCE IF YOU ARE UNABLE TO KEEP YOUR APPOINTMENT.  [phone]  How to prepare for your Myoview test:  1. Do not eat or drink after midnight 2. No caffeine for 24 hours prior to test 3. No smoking 24 hours prior to test. 4. Your medication may be taken with water.  If your doctor  stopped a medication because of this test, do not take that medication. 5. Ladies, please do not wear dresses.  Skirts or pants are appropriate. Please wear a short sleeve shirt. 6. No perfume, cologne or lotion. 7. Wear comfortable walking shoes. No heels!

## 2013-02-17 NOTE — Assessment & Plan Note (Signed)
She reports that some of her symptoms are similar to what she experienced in the past when she used to be on atorvastatin. Due to that, I will decrease the dose to 20 mg daily to see if that makes any difference. She is going to have routine labs done later this month with Dr. Jacky Kindle. She will need a followup lipid and liver profile at that time.

## 2013-02-17 NOTE — Assessment & Plan Note (Signed)
The patient's symptoms of exertional dyspnea and chest discomfort is worrisome for new angina post CABG. Her baseline ECG does not show any ischemic changes. I recommend further evaluation with a treadmill nuclear stress test. She might not be able to get her heart rate up and we might need to switch to Lexiscan at the time of the test. She requested refill on Percocet due to continued discomfort at the surgical site. I gave her 40 tablets.

## 2013-02-18 ENCOUNTER — Encounter: Payer: Self-pay | Admitting: *Deleted

## 2013-02-28 ENCOUNTER — Ambulatory Visit: Payer: Self-pay | Admitting: Cardiovascular Disease

## 2013-02-28 DIAGNOSIS — R079 Chest pain, unspecified: Secondary | ICD-10-CM

## 2013-03-03 DIAGNOSIS — E119 Type 2 diabetes mellitus without complications: Secondary | ICD-10-CM | POA: Diagnosis not present

## 2013-03-03 DIAGNOSIS — R82998 Other abnormal findings in urine: Secondary | ICD-10-CM | POA: Diagnosis not present

## 2013-03-03 DIAGNOSIS — E890 Postprocedural hypothyroidism: Secondary | ICD-10-CM | POA: Diagnosis not present

## 2013-03-24 DIAGNOSIS — F329 Major depressive disorder, single episode, unspecified: Secondary | ICD-10-CM | POA: Diagnosis not present

## 2013-03-24 DIAGNOSIS — K219 Gastro-esophageal reflux disease without esophagitis: Secondary | ICD-10-CM | POA: Diagnosis not present

## 2013-03-24 DIAGNOSIS — E890 Postprocedural hypothyroidism: Secondary | ICD-10-CM | POA: Diagnosis not present

## 2013-03-24 DIAGNOSIS — Z0000 Encounter for general adult medical examination without abnormal findings: Secondary | ICD-10-CM | POA: Diagnosis not present

## 2013-03-24 DIAGNOSIS — E119 Type 2 diabetes mellitus without complications: Secondary | ICD-10-CM | POA: Diagnosis not present

## 2013-03-24 DIAGNOSIS — I2510 Atherosclerotic heart disease of native coronary artery without angina pectoris: Secondary | ICD-10-CM | POA: Diagnosis not present

## 2013-03-24 DIAGNOSIS — E042 Nontoxic multinodular goiter: Secondary | ICD-10-CM | POA: Diagnosis not present

## 2013-03-24 DIAGNOSIS — Z1212 Encounter for screening for malignant neoplasm of rectum: Secondary | ICD-10-CM | POA: Diagnosis not present

## 2013-03-24 DIAGNOSIS — M1990 Unspecified osteoarthritis, unspecified site: Secondary | ICD-10-CM | POA: Diagnosis not present

## 2013-04-21 ENCOUNTER — Telehealth: Payer: Self-pay

## 2013-04-21 NOTE — Telephone Encounter (Signed)
Received fax from [facility] stating pt was no-show. "Messages not returned after 2-3 calls/no answer (brochure was sent after that).

## 2013-04-26 DIAGNOSIS — Z1231 Encounter for screening mammogram for malignant neoplasm of breast: Secondary | ICD-10-CM | POA: Diagnosis not present

## 2013-05-20 ENCOUNTER — Encounter: Payer: Self-pay | Admitting: *Deleted

## 2013-05-20 ENCOUNTER — Ambulatory Visit (INDEPENDENT_AMBULATORY_CARE_PROVIDER_SITE_OTHER): Payer: Medicare Other | Admitting: Cardiovascular Disease

## 2013-05-20 ENCOUNTER — Encounter: Payer: Self-pay | Admitting: Cardiovascular Disease

## 2013-05-20 VITALS — BP 118/62 | HR 70 | Ht 63.0 in | Wt 130.5 lb

## 2013-05-20 DIAGNOSIS — E785 Hyperlipidemia, unspecified: Secondary | ICD-10-CM | POA: Diagnosis not present

## 2013-05-20 DIAGNOSIS — Z951 Presence of aortocoronary bypass graft: Secondary | ICD-10-CM | POA: Diagnosis not present

## 2013-05-20 DIAGNOSIS — R079 Chest pain, unspecified: Secondary | ICD-10-CM | POA: Diagnosis not present

## 2013-05-20 DIAGNOSIS — Z01810 Encounter for preprocedural cardiovascular examination: Secondary | ICD-10-CM | POA: Diagnosis not present

## 2013-05-20 NOTE — Assessment & Plan Note (Signed)
Unfortunately, he did not tolerate atorvastatin due to myalgia. She has known history of statin intolerance.

## 2013-05-20 NOTE — Assessment & Plan Note (Signed)
Unfortunately, the patient's symptoms are suggestive of class III angina in spite of medical therapy. She seems to be very limited at this point. Due to that, I recommend proceeding with cardiac catheterization and possible coronary intervention. I will plan on doing this via the left radial artery. Risks, benefits and alternatives were discussed. Continue current medications.

## 2013-05-20 NOTE — Patient Instructions (Signed)

## 2013-05-20 NOTE — Progress Notes (Signed)
HPI  This is a 72 year old female who is here today for a followup regarding coronary artery disease status post CABG.  She has known history of diabetes and hypertension. She presented in early June with non-ST elevation myocardial infarction. She underwent cardiac catheterization which showed significant three-vessel coronary artery disease with an ejection fraction of 45-50%. She underwent CABG by Dr. Dorris Fetch without complications. Postop palpitations improved after increasing the dose of metoprolol. She started increasing his physical activities and felt well for a few weeks. However, she continues to get worse with symptoms of substernal chest tightness and dyspnea with minimal activities even walking to the mailbox. She tried to exercise on a treadmill but was only able to do that for one to 2 minutes and had to stop due to her symptoms. We did a pharmacologic nuclear stress test which showed no evidence of ischemia. However, the stress test was very suboptimal due to intense GI uptake and breast attenuation.  Allergies  Allergen Reactions  . Amitriptyline Other (See Comments)    disoriented  . Cortisone Other (See Comments)    Eyes hurt and sugar goes up  . Macrodantin [Nitrofurantoin] Itching and Swelling  . Motrin [Ibuprofen] Other (See Comments)    Fluid retention     Current Outpatient Prescriptions on File Prior to Visit  Medication Sig Dispense Refill  . Artificial Tear Ointment (ARTIFICIAL TEARS) ointment Place 1 application into both eyes at bedtime.      Marland Kitchen aspirin 325 MG tablet Take 325 mg by mouth daily.      Marland Kitchen atorvastatin (LIPITOR) 20 MG tablet Take 1 tablet (20 mg total) by mouth daily.  90 tablet  3  . insulin glargine (LANTUS) 100 UNIT/ML injection Inject 35 Units into the skin at bedtime.      Marland Kitchen ketotifen (THERA TEARS ALLERGY) 0.025 % ophthalmic solution Place 1 drop into both eyes as needed (for allergy eyes.).      Marland Kitchen levothyroxine (SYNTHROID, LEVOTHROID) 88  MCG tablet Take 88 mcg by mouth daily before breakfast.      . lisinopril (PRINIVIL,ZESTRIL) 5 MG tablet Take 1 tablet (5 mg total) by mouth daily.  90 tablet  3  . LORazepam (ATIVAN) 1 MG tablet Take 2.5 mg by mouth at bedtime.      . metFORMIN (GLUCOPHAGE-XR) 500 MG 24 hr tablet Take 500 mg by mouth daily with breakfast.      . metoprolol tartrate (LOPRESSOR) 25 MG tablet Take 1 tablet (25 mg total) by mouth 2 (two) times daily.  180 tablet  3  . oxyCODONE-acetaminophen (PERCOCET) 7.5-325 MG per tablet Take 1 tablet by mouth every 4 (four) hours as needed for pain.  40 tablet  0  . pantoprazole (PROTONIX) 40 MG tablet Take 40 mg by mouth daily at 2 PM daily at 2 PM.       No current facility-administered medications on file prior to visit.     Past Medical History  Diagnosis Date  . Depression   . Hypothyroidism   . GERD (gastroesophageal reflux disease)   . Diabetes mellitus, type 2   . IBS (irritable bowel syndrome)   . OA (osteoarthritis)   . Lymphocytosis 03/24/2012  . PONV (postoperative nausea and vomiting)   . Chest pain     a. nl myoview ~ 2006.  Marland Kitchen Hyperlipidemia      Past Surgical History  Procedure Laterality Date  . Thyroidectomy  2010  . Coronary artery bypass graft N/A 01/07/2013  Procedure: CORONARY ARTERY BYPASS GRAFTING (CABG);  Surgeon: Loreli Slot, MD;  Location[facility] OR;  Service: Open Heart Surgery;  Laterality: N/A;  . Cardiac catheterization      [facility]     Family History  Problem Relation Age of Onset  . Cancer Mother     bone CA, died @ 18  . Emphysema Father     died @ 39  . Emphysema Brother     died @ 13  . Diabetes Son     alive and well.     History   Social History  . Marital Status: Married    Spouse Name: N/A    Number of Children: N/A  . Years of Education: N/A   Occupational History  . Not on file.   Social History Main Topics  . Smoking status: Never Smoker   . Smokeless tobacco: Never Used  . Alcohol Use: No  .  Drug Use: No  . Sexual Activity: No   Other Topics Concern  . Not on file   Social History Narrative   Lives in [place] with husband.     PHYSICAL EXAM   There were no vitals taken for this visit. Constitutional: She is oriented to person, place, and time. She appears well-developed and well-nourished. No distress.  HENT: No nasal discharge.  Head: Normocephalic and atraumatic.  Eyes: Pupils are equal and round. Right eye exhibits no discharge. Left eye exhibits no discharge.  Neck: Normal range of motion. Neck supple. No JVD present. No thyromegaly present.  Cardiovascular: Normal rate, regular rhythm, normal heart sounds. Exam reveals no gallop and no friction rub. No murmur heard.  Pulmonary/Chest: Effort normal and decreased breath sounds at the left base. No stridor. No respiratory distress. She has no wheezes. She has no rales. She exhibits no tenderness.  Abdominal: Soft. Bowel sounds are normal. She exhibits no distension. There is no tenderness. There is no rebound and no guarding.  Musculoskeletal: Normal range of motion. She exhibits no edema and no tenderness.  Neurological: She is alert and oriented to person, place, and time. Coordination normal.  Skin: Skin is warm and dry. No rash noted. She is not diaphoretic. No erythema. No pallor.  Psychiatric: She has a normal mood and affect. Her behavior is normal. Judgment and thought content normal.     WGN:FAOZH  Rhythm  WITHIN NORMAL LIMITS    ASSESSMENT AND PLAN

## 2013-05-21 LAB — CBC WITH DIFFERENTIAL/PLATELET
Eos: 2 %
Immature Grans (Abs): 0 10*3/uL (ref 0.0–0.1)
Immature Granulocytes: 0 %
Lymphs: 41 %
MCV: 87 fL (ref 79–97)
Monocytes Absolute: 0.5 10*3/uL (ref 0.1–0.9)
Neutrophils Relative %: 50 %
RBC: 4.19 x10E6/uL (ref 3.77–5.28)
RDW: 14.2 % (ref 12.3–15.4)
WBC: 6.8 10*3/uL (ref 3.4–10.8)

## 2013-05-21 LAB — PROTIME-INR: INR: 1.1 (ref 0.8–1.2)

## 2013-05-21 LAB — BASIC METABOLIC PANEL
CO2: 21 mmol/L (ref 18–29)
Calcium: 9.1 mg/dL (ref 8.6–10.2)
Creatinine, Ser: 0.89 mg/dL (ref 0.57–1.00)
Glucose: 247 mg/dL — ABNORMAL HIGH (ref 65–99)
Potassium: 4.8 mmol/L (ref 3.5–5.2)
Sodium: 141 mmol/L (ref 134–144)

## 2013-05-23 ENCOUNTER — Encounter: Payer: Self-pay | Admitting: *Deleted

## 2013-05-24 ENCOUNTER — Encounter (HOSPITAL_COMMUNITY): Payer: Self-pay | Admitting: Pharmacy Technician

## 2013-05-25 ENCOUNTER — Ambulatory Visit (HOSPITAL_COMMUNITY)
Admission: RE | Admit: 2013-05-25 | Discharge: 2013-05-26 | Disposition: A | Discharge status: Home / Self Care | Payer: Medicare Other | Source: Ambulatory Visit | Attending: Cardiovascular Disease | Admitting: Cardiovascular Disease

## 2013-05-25 ENCOUNTER — Encounter (HOSPITAL_COMMUNITY): Payer: Self-pay | Admitting: General Practice

## 2013-05-25 ENCOUNTER — Encounter (HOSPITAL_COMMUNITY)
Admission: RE | Disposition: A | Discharge status: Home / Self Care | Payer: Medicare Other | Source: Ambulatory Visit | Attending: Cardiovascular Disease

## 2013-05-25 DIAGNOSIS — K219 Gastro-esophageal reflux disease without esophagitis: Secondary | ICD-10-CM | POA: Diagnosis not present

## 2013-05-25 DIAGNOSIS — Z79899 Other long term (current) drug therapy: Secondary | ICD-10-CM | POA: Diagnosis not present

## 2013-05-25 DIAGNOSIS — I200 Unstable angina: Secondary | ICD-10-CM | POA: Diagnosis not present

## 2013-05-25 DIAGNOSIS — I2510 Atherosclerotic heart disease of native coronary artery without angina pectoris: Secondary | ICD-10-CM | POA: Insufficient documentation

## 2013-05-25 DIAGNOSIS — I214 Non-ST elevation (NSTEMI) myocardial infarction: Secondary | ICD-10-CM

## 2013-05-25 DIAGNOSIS — E785 Hyperlipidemia, unspecified: Secondary | ICD-10-CM | POA: Diagnosis not present

## 2013-05-25 DIAGNOSIS — R079 Chest pain, unspecified: Secondary | ICD-10-CM

## 2013-05-25 DIAGNOSIS — I252 Old myocardial infarction: Secondary | ICD-10-CM | POA: Diagnosis not present

## 2013-05-25 DIAGNOSIS — Z951 Presence of aortocoronary bypass graft: Secondary | ICD-10-CM

## 2013-05-25 DIAGNOSIS — I25810 Atherosclerosis of coronary artery bypass graft(s) without angina pectoris: Secondary | ICD-10-CM | POA: Insufficient documentation

## 2013-05-25 DIAGNOSIS — E039 Hypothyroidism, unspecified: Secondary | ICD-10-CM | POA: Insufficient documentation

## 2013-05-25 DIAGNOSIS — I10 Essential (primary) hypertension: Secondary | ICD-10-CM | POA: Insufficient documentation

## 2013-05-25 DIAGNOSIS — E119 Type 2 diabetes mellitus without complications: Secondary | ICD-10-CM | POA: Diagnosis not present

## 2013-05-25 HISTORY — DX: Anxiety disorder, unspecified: F41.9

## 2013-05-25 HISTORY — DX: Unspecified osteoarthritis, unspecified site: M19.90

## 2013-05-25 HISTORY — DX: Headache: R51

## 2013-05-25 HISTORY — PX: CORONARY ANGIOPLASTY WITH STENT PLACEMENT: SHX49

## 2013-05-25 HISTORY — DX: Acute myocardial infarction, unspecified: I21.9

## 2013-05-25 HISTORY — DX: Atherosclerotic heart disease of native coronary artery without angina pectoris: I25.10

## 2013-05-25 HISTORY — PX: PERCUTANEOUS CORONARY STENT INTERVENTION (PCI-S): SHX5485

## 2013-05-25 HISTORY — DX: Anemia, unspecified: D64.9

## 2013-05-25 HISTORY — DX: Essential (primary) hypertension: I10

## 2013-05-25 HISTORY — PX: LEFT HEART CATHETERIZATION WITH CORONARY/GRAFT ANGIOGRAM: SHX5450

## 2013-05-25 HISTORY — DX: Personal history of pneumonia (recurrent): Z87.01

## 2013-05-25 LAB — POCT ACTIVATED CLOTTING TIME: Activated Clotting Time: 421 s

## 2013-05-25 LAB — GLUCOSE, CAPILLARY
Glucose-Capillary: 136 mg/dL — ABNORMAL HIGH (ref 70–99)
Glucose-Capillary: 108 mg/dL — ABNORMAL HIGH (ref 70–99)
Glucose-Capillary: 126 mg/dL — ABNORMAL HIGH (ref 70–99)

## 2013-05-25 SURGERY — LEFT HEART CATHETERIZATION WITH CORONARY/GRAFT ANGIOGRAM
Anesthesia: LOCAL

## 2013-05-25 MED ORDER — SODIUM CHLORIDE 0.9 % IV SOLN
INTRAVENOUS | Status: AC
  Administered 2013-05-25: 12:00:00 via INTRAVENOUS

## 2013-05-25 MED ORDER — SODIUM CHLORIDE 0.9 % IV SOLN: INTRAVENOUS | Status: DC

## 2013-05-25 MED ORDER — ARTIFICIAL TEARS OP OINT
TOPICAL_OINTMENT | Freq: Every evening | OPHTHALMIC | Status: DC | PRN
  Filled 2013-05-25: qty 3.5

## 2013-05-25 MED ORDER — CLOPIDOGREL BISULFATE 300 MG PO TABS
ORAL_TABLET | ORAL | Status: AC
  Filled 2013-05-25: qty 1

## 2013-05-25 MED ORDER — ACETAMINOPHEN 325 MG PO TABS
650.0000 mg | ORAL_TABLET | Freq: Four times a day (QID) | ORAL | Status: DC | PRN
  Administered 2013-05-25: 650 mg via ORAL
  Filled 2013-05-25: qty 2

## 2013-05-25 MED ORDER — ACETAMINOPHEN 500 MG PO TABS
1000.0000 mg | ORAL_TABLET | Freq: Every day | ORAL | Status: DC
  Administered 2013-05-25: 22:00:00 1000 mg via ORAL
  Filled 2013-05-25 (×2): qty 2

## 2013-05-25 MED ORDER — LIDOCAINE HCL (PF) 1 % IJ SOLN
INTRAMUSCULAR | Status: AC
  Filled 2013-05-25: qty 30

## 2013-05-25 MED ORDER — MIDAZOLAM HCL 2 MG/2ML IJ SOLN
INTRAMUSCULAR | Status: AC
  Filled 2013-05-25: qty 2

## 2013-05-25 MED ORDER — PANTOPRAZOLE SODIUM 40 MG PO TBEC
40.0000 mg | DELAYED_RELEASE_TABLET | Freq: Every day | ORAL | Status: DC
  Administered 2013-05-25 – 2013-05-26 (×2): 40 mg via ORAL
  Filled 2013-05-25 (×2): qty 1

## 2013-05-25 MED ORDER — METOPROLOL TARTRATE 25 MG PO TABS
25.0000 mg | ORAL_TABLET | Freq: Two times a day (BID) | ORAL | Status: DC
  Administered 2013-05-25 – 2013-05-26 (×2): 25 mg via ORAL
  Filled 2013-05-25 (×3): qty 1

## 2013-05-25 MED ORDER — LISINOPRIL 5 MG PO TABS
5.0000 mg | ORAL_TABLET | Freq: Every day | ORAL | Status: DC
  Administered 2013-05-26: 10:00:00 5 mg via ORAL
  Filled 2013-05-25: qty 1

## 2013-05-25 MED ORDER — BIVALIRUDIN 250 MG IV SOLR
INTRAVENOUS | Status: AC
  Filled 2013-05-25: qty 250

## 2013-05-25 MED ORDER — ASPIRIN 325 MG PO TABS
325.0000 mg | ORAL_TABLET | Freq: Every day | ORAL | Status: DC
  Administered 2013-05-26: 10:00:00 325 mg via ORAL
  Filled 2013-05-25: qty 1

## 2013-05-25 MED ORDER — LORAZEPAM 0.5 MG PO TABS
2.5000 mg | ORAL_TABLET | Freq: Every day | ORAL | Status: DC
  Administered 2013-05-25: 2.5 mg via ORAL
  Filled 2013-05-25: qty 5

## 2013-05-25 MED ORDER — INSULIN GLARGINE 100 UNIT/ML ~~LOC~~ SOLN
35.0000 [IU] | Freq: Every evening | SUBCUTANEOUS | Status: DC | PRN
  Filled 2013-05-25: qty 0.35

## 2013-05-25 MED ORDER — FENTANYL CITRATE 0.05 MG/ML IJ SOLN
INTRAMUSCULAR | Status: AC
  Filled 2013-05-25: qty 2

## 2013-05-25 MED ORDER — ASPIRIN 81 MG PO CHEW
81.0000 mg | CHEWABLE_TABLET | ORAL | Status: AC
  Administered 2013-05-25: 81 mg via ORAL

## 2013-05-25 MED ORDER — CLOPIDOGREL BISULFATE 75 MG PO TABS
75.0000 mg | ORAL_TABLET | Freq: Every day | ORAL | Status: DC
  Administered 2013-05-26: 75 mg via ORAL
  Filled 2013-05-25: qty 1

## 2013-05-25 MED ORDER — TRAMADOL HCL 50 MG PO TABS
50.0000 mg | ORAL_TABLET | Freq: Two times a day (BID) | ORAL | Status: DC | PRN
  Administered 2013-05-25: 50 mg via ORAL
  Filled 2013-05-25: qty 1

## 2013-05-25 MED ORDER — KETOTIFEN FUMARATE 0.025 % OP SOLN
1.0000 [drp] | Freq: Every evening | OPHTHALMIC | Status: DC | PRN
  Filled 2013-05-25: qty 5

## 2013-05-25 MED ORDER — NITROGLYCERIN 0.2 MG/ML ON CALL CATH LAB
INTRAVENOUS | Status: AC
  Filled 2013-05-25: qty 1

## 2013-05-25 MED ORDER — SODIUM CHLORIDE 0.9 % IV SOLN: 250.0000 mL | INTRAVENOUS | Status: DC | PRN

## 2013-05-25 MED ORDER — ACETAMINOPHEN ER 650 MG PO TBCR: 1300.0000 mg | EXTENDED_RELEASE_TABLET | Freq: Every day | ORAL | Status: DC

## 2013-05-25 MED ORDER — SODIUM CHLORIDE 0.9 % IJ SOLN: 3.0000 mL | Freq: Two times a day (BID) | INTRAMUSCULAR | Status: DC

## 2013-05-25 MED ORDER — LEVOTHYROXINE SODIUM 88 MCG PO TABS
88.0000 ug | ORAL_TABLET | Freq: Every day | ORAL | Status: DC
  Administered 2013-05-26: 88 ug via ORAL
  Filled 2013-05-25 (×2): qty 1

## 2013-05-25 MED ORDER — REFRESH P.M. OP OINT: 1.0000 "application " | TOPICAL_OINTMENT | Freq: Every day | OPHTHALMIC | Status: DC

## 2013-05-25 MED ORDER — SODIUM CHLORIDE 0.9 % IJ SOLN: 3.0000 mL | INTRAMUSCULAR | Status: DC | PRN

## 2013-05-25 MED ORDER — ONDANSETRON HCL 4 MG/2ML IJ SOLN: 4.0000 mg | Freq: Four times a day (QID) | INTRAMUSCULAR | Status: DC | PRN

## 2013-05-25 MED ORDER — HEPARIN (PORCINE) IN NACL 2-0.9 UNIT/ML-% IJ SOLN
INTRAMUSCULAR | Status: AC
  Filled 2013-05-25: qty 1000

## 2013-05-25 NOTE — H&P (View-Only) (Signed)
  HPI  This is a 72-year-old female who is here today for a followup regarding coronary artery disease status post CABG.  She has known history of diabetes and hypertension. She presented in early June with non-ST elevation myocardial infarction. She underwent cardiac catheterization which showed significant three-vessel coronary artery disease with an ejection fraction of 45-50%. She underwent CABG by Dr. Hendrickson without complications. Postop palpitations improved after increasing the dose of metoprolol. She started increasing his physical activities and felt well for a few weeks. However, she continues to get worse with symptoms of substernal chest tightness and dyspnea with minimal activities even walking to the mailbox. She tried to exercise on a treadmill but was only able to do that for one to 2 minutes and had to stop due to her symptoms. We did a pharmacologic nuclear stress test which showed no evidence of ischemia. However, the stress test was very suboptimal due to intense GI uptake and breast attenuation.  Allergies  Allergen Reactions  . Amitriptyline Other (See Comments)    disoriented  . Cortisone Other (See Comments)    Eyes hurt and sugar goes up  . Macrodantin [Nitrofurantoin] Itching and Swelling  . Motrin [Ibuprofen] Other (See Comments)    Fluid retention     Current Outpatient Prescriptions on File Prior to Visit  Medication Sig Dispense Refill  . Artificial Tear Ointment (ARTIFICIAL TEARS) ointment Place 1 application into both eyes at bedtime.      . aspirin 325 MG tablet Take 325 mg by mouth daily.      . atorvastatin (LIPITOR) 20 MG tablet Take 1 tablet (20 mg total) by mouth daily.  90 tablet  3  . insulin glargine (LANTUS) 100 UNIT/ML injection Inject 35 Units into the skin at bedtime.      . ketotifen (THERA TEARS ALLERGY) 0.025 % ophthalmic solution Place 1 drop into both eyes as needed (for allergy eyes.).      . levothyroxine (SYNTHROID, LEVOTHROID) 88  MCG tablet Take 88 mcg by mouth daily before breakfast.      . lisinopril (PRINIVIL,ZESTRIL) 5 MG tablet Take 1 tablet (5 mg total) by mouth daily.  90 tablet  3  . LORazepam (ATIVAN) 1 MG tablet Take 2.5 mg by mouth at bedtime.      . metFORMIN (GLUCOPHAGE-XR) 500 MG 24 hr tablet Take 500 mg by mouth daily with breakfast.      . metoprolol tartrate (LOPRESSOR) 25 MG tablet Take 1 tablet (25 mg total) by mouth 2 (two) times daily.  180 tablet  3  . oxyCODONE-acetaminophen (PERCOCET) 7.5-325 MG per tablet Take 1 tablet by mouth every 4 (four) hours as needed for pain.  40 tablet  0  . pantoprazole (PROTONIX) 40 MG tablet Take 40 mg by mouth daily at 2 PM daily at 2 PM.       No current facility-administered medications on file prior to visit.     Past Medical History  Diagnosis Date  . Depression   . Hypothyroidism   . GERD (gastroesophageal reflux disease)   . Diabetes mellitus, type 2   . IBS (irritable bowel syndrome)   . OA (osteoarthritis)   . Lymphocytosis 03/24/2012  . PONV (postoperative nausea and vomiting)   . Chest pain     a. nl myoview ~ 2006.  . Hyperlipidemia      Past Surgical History  Procedure Laterality Date  . Thyroidectomy  2010  . Coronary artery bypass graft N/A 01/07/2013      Procedure: CORONARY ARTERY BYPASS GRAFTING (CABG);  Surgeon: Steven C Hendrickson, MD;  Location: [facility] OR;  Service: Open Heart Surgery;  Laterality: N/A;  . Cardiac catheterization      [facility]     Family History  Problem Relation Age of Onset  . Cancer Mother     bone CA, died @ 66  . Emphysema Father     died @ 59  . Emphysema Brother     died @ 55  . Diabetes Son     alive and well.     History   Social History  . Marital Status: Married    Spouse Name: N/A    Number of Children: N/A  . Years of Education: N/A   Occupational History  . Not on file.   Social History Main Topics  . Smoking status: Never Smoker   . Smokeless tobacco: Never Used  . Alcohol Use: No  .  Drug Use: No  . Sexual Activity: No   Other Topics Concern  . Not on file   Social History Narrative   Lives in [place] with husband.     PHYSICAL EXAM   There were no vitals taken for this visit. Constitutional: She is oriented to person, place, and time. She appears well-developed and well-nourished. No distress.  HENT: No nasal discharge.  Head: Normocephalic and atraumatic.  Eyes: Pupils are equal and round. Right eye exhibits no discharge. Left eye exhibits no discharge.  Neck: Normal range of motion. Neck supple. No JVD present. No thyromegaly present.  Cardiovascular: Normal rate, regular rhythm, normal heart sounds. Exam reveals no gallop and no friction rub. No murmur heard.  Pulmonary/Chest: Effort normal and decreased breath sounds at the left base. No stridor. No respiratory distress. She has no wheezes. She has no rales. She exhibits no tenderness.  Abdominal: Soft. Bowel sounds are normal. She exhibits no distension. There is no tenderness. There is no rebound and no guarding.  Musculoskeletal: Normal range of motion. She exhibits no edema and no tenderness.  Neurological: She is alert and oriented to person, place, and time. Coordination normal.  Skin: Skin is warm and dry. No rash noted. She is not diaphoretic. No erythema. No pallor.  Psychiatric: She has a normal mood and affect. Her behavior is normal. Judgment and thought content normal.     EKG:Sinus  Rhythm  WITHIN NORMAL LIMITS    ASSESSMENT AND PLAN   

## 2013-05-25 NOTE — Interval H&P Note (Signed)
Cath Lab Visit (complete for each Cath Lab visit)  Clinical Evaluation Leading to the Procedure:   ACS: no  Non-ACS:    Anginal Classification: CCS III  Anti-ischemic medical therapy: Maximal Therapy (2 or more classes of medications)  Non-Invasive Test Results: Equivocal test results  Prior CABG: Previous CABG      History and Physical Interval Note:  05/25/2013 11:15 AM  Angela Barrett  has presented today for surgery, with the diagnosis of cad  The various methods of treatment have been discussed with the patient and family. After consideration of risks, benefits and other options for treatment, the patient has consented to  Procedure(s): LEFT HEART CATHETERIZATION WITH CORONARY/GRAFT ANGIOGRAM (N/A) as a surgical intervention .  The patient's history has been reviewed, patient examined, no change in status, stable for surgery.  I have reviewed the patient's chart and labs.  Questions were answered to the patient's satisfaction.     Lorine Bears

## 2013-05-25 NOTE — CV Procedure (Signed)
Cardiac Catheterization Procedure Note  Name: Angela Barrett MRN: [id number] DOB: July 20, 1941  Procedure: Left Heart Cath, Selective Coronary Angiography, LV angiography, SVG/ LIMA angiography  PTCA/Stent of proximal/mid right coronary artery.   Indication: Class 3 angina s/p CABG   Medications:  Sedation:  2 mg IV Versed, 50 mcg IV Fentanyl  Contrast:  125 ml Omnipaque  Diagnostic Procedure Details: The right groin was prepped, draped, and anesthetized with 1% lidocaine. Using the modified Seldinger technique, a 5 French sheath was introduced into the right femoral artery. Standard Judkins catheters were used for selective coronary angiography and left ventriculography. An IM catheter was used to engage the LIMA.  Catheter exchanges were performed over a wire.  The diagnostic procedure was well-tolerated without immediate complications.  Procedural Findings:  Hemodynamics: AO:  126/57   mmHg LV:  130/6    mmHg LVEDP: 18  mmHg  Coronary angiography: Coronary dominance: right   Left Main:  normal  Left Anterior Descending (LAD):  Moderately calcified and diffusely diseased with 60-70% disease in proximal and mid segment.   1st diagonal (D1):  Normal in size with diffuse 70% disease proximally   Circumflex (LCx):  Normal in size and nondominant. It is occluded in mid segment at OM 2.   1st obtuse marginal:  Very small in size   2nd obtuse marginal:  Medium in size with 60% proximal disease   Right Coronary Artery: normal in size and dominant. There is 50-60% disease proximally followed by 80% stenosis in mid segment. There is 30% disease distally.   Posterior descending artery: patent  Posterior AV segment: MLI.   Posterolateral branchs:  Large second PL with 60% ostial disease. It gives collaterals to OM3.   SVG to second diagonal: patent but small with 60% anastomosis lesion.  SVG to RPDA/OM3: occluded at the ostium.  LIMA to LAD: patent but there is 70% stenosis in  distal LAD (very small caliber)   Left ventriculography: Left ventricular systolic function is low normal , LVEF is estimated at 50% %, there is no significant mitral regurgitation   PCI Procedure Note:  Following the diagnostic procedure, the decision was made to proceed with PCI. The sheath was upsized to a 6 [place]. Weight-based bivalirudin was given for anticoagulation. Once a therapeutic ACT was achieved, a 6 [place] JR4 guide catheter was inserted.  A Intuition coronary guidewire was used to cross the lesion.  The lesion was predilated with a 2.5 X 12 mm balloon.  The lesion was then stented with a 3.0 X 33 mm Xience stent.  The stent was postdilated with a 3.25 noncompliant balloon.  Following PCI, there was 0% residual stenosis and TIMI-3 flow. Final angiography confirmed an excellent result. Femoral hemostasis was achieved with a Perclose device.  The patient tolerated the PCI procedure well. There were no immediate procedural complications.  The patient was transferred to the post catheterization recovery area for further monitoring.  PCI Data: Vessel - RCA /Segment - proximal and mid Percent Stenosis (pre)  80% TIMI-flow 3 Stent 3 X 33 mm Xience DES Percent Stenosis (post) 0%  TIMI-flow (post)  3  Final Conclusions:  1. Significant 3 vessel CAD with diffuse diabetic disease. Patent grafts except SVG to RPDA/OM3. OM3 gets collaterals from RCA.  2. Low normal LVSF.  3. Successful PCI and DES placement to proximal and mid right coronary artery.   Recommendations:  Dual antiplatelet therapy for at least 12 months. Treat residual disease medically.   Lorine Bears  MD, [facility] 05/25/2013, 11:21 AM

## 2013-05-26 ENCOUNTER — Encounter (HOSPITAL_COMMUNITY): Payer: Self-pay | Admitting: Nurse Practitioner

## 2013-05-26 DIAGNOSIS — E039 Hypothyroidism, unspecified: Secondary | ICD-10-CM | POA: Diagnosis present

## 2013-05-26 DIAGNOSIS — I25810 Atherosclerosis of coronary artery bypass graft(s) without angina pectoris: Secondary | ICD-10-CM | POA: Diagnosis not present

## 2013-05-26 DIAGNOSIS — I200 Unstable angina: Secondary | ICD-10-CM

## 2013-05-26 DIAGNOSIS — I214 Non-ST elevation (NSTEMI) myocardial infarction: Secondary | ICD-10-CM | POA: Diagnosis not present

## 2013-05-26 DIAGNOSIS — R079 Chest pain, unspecified: Secondary | ICD-10-CM

## 2013-05-26 DIAGNOSIS — E119 Type 2 diabetes mellitus without complications: Secondary | ICD-10-CM | POA: Diagnosis present

## 2013-05-26 DIAGNOSIS — Z951 Presence of aortocoronary bypass graft: Secondary | ICD-10-CM

## 2013-05-26 DIAGNOSIS — I2510 Atherosclerotic heart disease of native coronary artery without angina pectoris: Secondary | ICD-10-CM | POA: Diagnosis not present

## 2013-05-26 DIAGNOSIS — I10 Essential (primary) hypertension: Secondary | ICD-10-CM | POA: Diagnosis present

## 2013-05-26 DIAGNOSIS — K219 Gastro-esophageal reflux disease without esophagitis: Secondary | ICD-10-CM | POA: Diagnosis present

## 2013-05-26 LAB — GLUCOSE, CAPILLARY: Glucose-Capillary: 123 mg/dL — ABNORMAL HIGH (ref 70–99)

## 2013-05-26 LAB — BASIC METABOLIC PANEL
BUN: 14 mg/dL (ref 6–23)
CO2: 25 mEq/L (ref 19–32)
Calcium: 8.9 mg/dL (ref 8.4–10.5)
Chloride: 107 mEq/L (ref 96–112)
Creatinine, Ser: 0.85 mg/dL (ref 0.50–1.10)
GFR calc Af Amer: 77 mL/min — ABNORMAL LOW (ref >90)

## 2013-05-26 LAB — CBC
HCT: 35.2 % — ABNORMAL LOW (ref 36.0–46.0)
MCH: 30.3 pg (ref 26.0–34.0)
MCHC: 34.4 g/dL (ref 30.0–36.0)
MCV: 88 fL (ref 78.0–100.0)
Platelets: 219 10*3/uL (ref 150–400)
RDW: 13.1 % (ref 11.5–15.5)

## 2013-05-26 MED ORDER — NITROGLYCERIN 0.4 MG SL SUBL: 0.4000 mg | SUBLINGUAL_TABLET | SUBLINGUAL | Status: DC | PRN

## 2013-05-26 MED ORDER — ROSUVASTATIN CALCIUM 5 MG PO TABS: 5.0000 mg | ORAL_TABLET | Freq: Every day | ORAL | Status: DC

## 2013-05-26 MED ORDER — CLOPIDOGREL BISULFATE 75 MG PO TABS: 75.0000 mg | ORAL_TABLET | Freq: Every day | ORAL | Status: DC

## 2013-05-26 MED ORDER — LIVING WELL WITH DIABETES BOOK
Freq: Once | Status: AC
  Administered 2013-05-26: 05:00:00
  Filled 2013-05-26: qty 1

## 2013-05-26 MED ORDER — METFORMIN HCL ER 500 MG PO TB24: 500.0000 mg | ORAL_TABLET | Freq: Every day | ORAL | Status: DC

## 2013-05-26 MED ORDER — ASPIRIN 81 MG PO TABS: 81.0000 mg | ORAL_TABLET | Freq: Every day | ORAL | Status: AC

## 2013-05-26 MED FILL — Sodium Chloride IV Soln 0.9%: INTRAVENOUS | Qty: 50 | Status: AC

## 2013-05-26 NOTE — Progress Notes (Signed)
CARDIAC REHAB PHASE I   PRE:  Rate/Rhythm: 61 SR    BP: sitting 122/62    SaO2:   MODE:  Ambulation: 1000 ft   POST:  Rate/Rhythm: 80 SR    BP: sitting 137/88     SaO2:   Tolerated well, feels good. Ed completed. Pt tries to be very compliant. Was unable to do CRPII after CABG due to caring for her husband but is open to trying it in [place] or [place]. Will send referral to both.  [id number]   Elissa Lovett Dodson CES, ACSM 05/26/2013 8:26 AM

## 2013-05-26 NOTE — Discharge Summary (Signed)
Discharge Summary   Patient ID: Angela Barrett,  MRN: [id number], DOB/AGE: 1940/12/25 72 y.o.  Admit date: 05/25/2013 Discharge date: 05/26/2013  Primary Care Provider: ARONSON,RICHARD A Primary Cardiologist: Judie Petit. Kirke Corin, MD  Discharge Diagnoses Principal Problem:   Unstable angina  *s/p PCI/DES of the native RCA this admission.  Active Problems:   Coronary artery disease   S/P CABG x 4  **2/4 patent grafts on cath this admission with occluded VG->RPDA->OM3.   Diabetes mellitus, type 2   Hyperlipidemia   Hypothyroidism   Hypertension   GERD (gastroesophageal reflux disease)  Allergies Allergies  Allergen Reactions  . Amitriptyline Other (See Comments)    disoriented  . Cortisone Other (See Comments)    Eyes hurt and sugar goes up  . Lipitor [Atorvastatin]     Fatigue and weakness and high and low doses   . Macrodantin [Nitrofurantoin] Itching and Swelling  . Motrin [Ibuprofen] Other (See Comments)    Fluid retention   Procedures  Cardiac Catheterization and Percutaneous Coronary Intervention 10.22.2014  Hemodynamics: AO:  126/57   mmHg LV:  130/6    mmHg LVEDP: 18  mmHg  Coronary angiography: Coronary dominance: right     Left Main:  normal  Left Anterior Descending (LAD):  Moderately calcified and diffusely diseased with 60-70% disease in proximal and mid segment.    1st diagonal (D1):  Normal in size with diffuse 70% disease proximally     Circumflex (LCx):  Normal in size and nondominant. It is occluded in mid segment at OM 2.    1st obtuse marginal:  Very small in size    2nd obtuse marginal:  Medium in size with 60% proximal disease     Right Coronary Artery: normal in size and dominant. There is 50-60% disease proximally followed by 80% stenosis in mid segment. There is 30% disease distally.    **The native RCA was successfully stented using a 3.0 X 33 mm Xience drug eluting stent.   Posterior descending artery: patent  Posterior AV  segment: MLI.    Posterolateral branchs:  Large second PL with 60% ostial disease. It gives collaterals to OM3.  SVG to second diagonal: patent but small with 60% anastomosis lesion.   SVG to RPDA/OM3: occluded at the ostium.   LIMA to LAD: patent but there is 70% stenosis in distal LAD (very small caliber)   Left ventriculography: Left ventricular systolic function is low normal , LVEF is estimated at 50% %, there is no significant mitral regurgitation  _____________   History of Present Illness  72 y/o female with history of NSTEMI with subsequent 4 vessel CABG in June of 2014, who was recently seen in cardiology clinic secondary to recurrent exertional angina.  She underwent nuclear stress testing, which did not show evidence of ischemia but due to ongoing symptoms, decision was made to pursue diagnostic catheterization.  Hospital Course  Patient presented to the [facility] cath lab on 10/22 and underwent diagnostic catheterization revealing a new occlusion of the sequential vein graft to the RPDA and OM3.  The LIMA->LAD and VG->D2 were patent.  As she had an 80% stenosis in the native RCA, which was now unprotected, it was intervened upon with placement of a 3.0 x 33 mm Xience DES.  Patient tolerated procedure well and post-procedure has been ambulating without recurrent symptoms or limitations.  She will be discharged home today in good condition.  Of note, we have added low-dose crestor at discharge.  She was previously on lipitor, which  caused myalgias and fatigue and is willing to try an alternate statin.  Discharge Vitals Blood pressure 131/71, pulse 68, temperature 97.7 F (36.5 C), temperature source Oral, resp. rate 18, height 5\' 3"  (1.6 m), weight 131 lb 2.8 oz (59.5 kg), SpO2 96.00%.  Filed Weights   05/25/13 0817 05/26/13 0037  Weight: 130 lb (58.968 kg) 131 lb 2.8 oz (59.5 kg)    Labs  CBC  Recent Labs  05/26/13 0500  WBC 5.6  HGB 12.1  HCT 35.2*  MCV 88.0  PLT 219    Basic Metabolic Panel  Recent Labs  05/26/13 0500  NA 141  K 4.3  CL 107  CO2 25  GLUCOSE 119*  BUN 14  CREATININE 0.85  CALCIUM 8.9   Disposition  Pt is being discharged home today in good condition.  Follow-up Plans & Appointments      Follow-up Information   Follow up with Tereso Newcomer, PA-C On 06/06/2013. (11:50 AM)    Specialty:  Physician Assistant   Contact information:   [address] [place] [address] [phone]       Follow up with Minda Meo, MD. (as scheduled.)    Specialty:  Internal Medicine   Contact information:   [address] [facility] [place] [address] [phone]      Discharge Medications    Medication List         artificial tears ointment  Place 1 application into both eyes at bedtime.     aspirin 81 MG tablet  Take 1 tablet (81 mg total) by mouth daily.     clopidogrel 75 MG tablet  Commonly known as:  PLAVIX  Take 1 tablet (75 mg total) by mouth daily with breakfast.     insulin glargine 100 UNIT/ML injection  Commonly known as:  LANTUS  Inject 35 Units into the skin at bedtime as needed (if sugar is higher than 200). Only if sugar is over 200.     levothyroxine 88 MCG tablet  Commonly known as:  SYNTHROID, LEVOTHROID  Take 88 mcg by mouth daily before breakfast.     lisinopril 5 MG tablet  Commonly known as:  PRINIVIL,ZESTRIL  Take 1 tablet (5 mg total) by mouth daily.     LORazepam 1 MG tablet  Commonly known as:  ATIVAN  Take 2.5 mg by mouth at bedtime.     metFORMIN 500 MG 24 hr tablet - TO BE RESUMED ON 10/24.  Commonly known as:  GLUCOPHAGE-XR  Take 1 tablet (500 mg total) by mouth daily with breakfast.     metoprolol tartrate 25 MG tablet  Commonly known as:  LOPRESSOR  Take 1 tablet (25 mg total) by mouth 2 (two) times daily.     nitroGLYCERIN 0.4 MG SL tablet  Commonly known as:  NITROSTAT  Place 1 tablet (0.4 mg total) under the tongue  every 5 (five) minutes as needed for chest pain.     pantoprazole 40 MG tablet  Commonly known as:  PROTONIX  Take 40 mg by mouth daily.     rosuvastatin 5 MG tablet  Commonly known as:  CRESTOR  Take 1 tablet (5 mg total) by mouth daily.     THERA TEARS ALLERGY 0.025 % ophthalmic solution  Generic drug:  ketotifen  Place 1 drop into both eyes at bedtime as needed (for allergy eyes.).     TYLENOL ARTHRITIS PAIN 650 MG CR tablet  Generic drug:  acetaminophen  Take 1,300 mg by mouth at bedtime.       Outstanding Labs/Studies  Follow-up lipids/lft's in 8 wks.  Duration of Discharge Encounter   Greater than 30 minutes including physician time.  Signed, Nicolasa Ducking NP 05/26/2013, 8:59 AM   Attending Note:   The patient was seen and examined.  Agree with assessment and plan as noted above.  Changes made to the above note as needed.  See my note from earlier today.   Vesta Mixer, Montez Hageman., MD, [facility] 05/26/2013, 1:30 PM

## 2013-05-26 NOTE — Progress Notes (Signed)
HPI  This is a 72 year old female who is here  for a followup regarding coronary artery disease status post CABG.  She has known history of diabetes and hypertension. She presented in early June with non-ST elevation myocardial infarction. She underwent cardiac catheterization which showed significant three-vessel coronary artery disease with an ejection fraction of 45-50%. She underwent CABG by Dr. Dorris Fetch without complications. Postop palpitations improved after increasing the dose of metoprolol. She started increasing his physical activities and felt well for a few weeks. However, she continues to get worse with symptoms of substernal chest tightness and dyspnea with minimal activities even walking to the mailbox. She tried to exercise on a treadmill but was only able to do that for one to 2 minutes and had to stop due to her symptoms. We did a pharmacologic nuclear stress test which showed no evidence of ischemia. However, the stress test was very suboptimal due to intense GI uptake and breast attenuation.  She had successful PCI of her prox and mid RCA. She is doing well.   Allergies  Allergen Reactions  . Amitriptyline Other (See Comments)    disoriented  . Cortisone Other (See Comments)    Eyes hurt and sugar goes up  . Macrodantin [Nitrofurantoin] Itching and Swelling  . Motrin [Ibuprofen] Other (See Comments)    Fluid retention     No current facility-administered medications on file prior to encounter.   Current Outpatient Prescriptions on File Prior to Encounter  Medication Sig Dispense Refill  . Artificial Tear Ointment (ARTIFICIAL TEARS) ointment Place 1 application into both eyes at bedtime.      Marland Kitchen aspirin 325 MG tablet Take 325 mg by mouth daily.      . insulin glargine (LANTUS) 100 UNIT/ML injection Inject 35 Units into the skin at bedtime as needed (if sugar is higher than 200). Only if sugar is over 200.      . ketotifen (THERA TEARS ALLERGY) 0.025 % ophthalmic  solution Place 1 drop into both eyes at bedtime as needed (for allergy eyes.).       Marland Kitchen levothyroxine (SYNTHROID, LEVOTHROID) 88 MCG tablet Take 88 mcg by mouth daily before breakfast.      . lisinopril (PRINIVIL,ZESTRIL) 5 MG tablet Take 1 tablet (5 mg total) by mouth daily.  90 tablet  3  . LORazepam (ATIVAN) 1 MG tablet Take 2.5 mg by mouth at bedtime.      . metFORMIN (GLUCOPHAGE-XR) 500 MG 24 hr tablet Take 500 mg by mouth daily with breakfast.      . metoprolol tartrate (LOPRESSOR) 25 MG tablet Take 1 tablet (25 mg total) by mouth 2 (two) times daily.  180 tablet  3  . pantoprazole (PROTONIX) 40 MG tablet Take 40 mg by mouth daily.          Past Medical History  Diagnosis Date  . Depression   . Hypothyroidism   . GERD (gastroesophageal reflux disease)   . IBS (irritable bowel syndrome)   . Lymphocytosis 03/24/2012  . PONV (postoperative nausea and vomiting)   . Chest pain     a. nl myoview ~ 2006.  Marland Kitchen Hyperlipidemia   . Coronary artery disease   . Hypertension   . Myocardial infarction 01/05/2013  . Pneumonia 1985  . Exertional shortness of breath   . Diabetes mellitus, type 2     "dx'd 1992" (05/25/2013)  . Anemia     "after OHS in 01/2013" (05/25/2013)  . Headache(784.0)     "  monthly" (05/25/2013)  . OA (osteoarthritis)   . Arthritis     "joints" (05/25/2013)  . Anxiety      Past Surgical History  Procedure Laterality Date  . Thyroidectomy  2010  . Coronary artery bypass graft N/A 01/07/2013    Procedure: CORONARY ARTERY BYPASS GRAFTING (CABG);  Surgeon: Loreli Slot, MD;  Location: [facility] OR;  Service: Open Heart Surgery;  Laterality: N/A;  . Cardiac catheterization  01/2013  . Coronary angioplasty with stent placement  05/25/2013    "1" (05/25/2013)  . Carpal tunnel release Bilateral 1990-1992  . Knee arthroscopy Bilateral 1993-1996  . Hammer toe surgery Right 2009  . Bladder suspension  2005  . Vaginal hysterectomy  1974     Family History  Problem  Relation Age of Onset  . Cancer Mother     bone CA, died @ 54  . Emphysema Father     died @ 81  . Emphysema Brother     died @ 3  . Diabetes Son     alive and well.     History   Social History  . Marital Status: Married    Spouse Name: N/A    Number of Children: N/A  . Years of Education: N/A   Occupational History  . Not on file.   Social History Main Topics  . Smoking status: Never Smoker   . Smokeless tobacco: Never Used  . Alcohol Use: No  . Drug Use: No  . Sexual Activity: Not Currently   Other Topics Concern  . Not on file   Social History Narrative   Lives in [place] with husband.     PHYSICAL EXAM   BP 127/62  Pulse 65  Temp(Src) 97.6 F (36.4 C) (Oral)  Resp 20  Ht 5\' 3"  (1.6 m)  Wt 131 lb 2.8 oz (59.5 kg)  BMI 23.24 kg/m2  SpO2 96% Constitutional: She is oriented to person, place, and time. She appears well-developed and well-nourished. No distress.  HENT: normal Head: Normocephalic and atraumatic.  Neck: Normal range of motion. Neck supple. No JVD present. No thyromegaly present.  Cardiovascular: Normal rate, regular rhythm, normal heart sounds. Exam reveals no gallop and no friction rub. No murmur heard.  Pulmonary/Chest: Effort normal and decreased breath sounds at the left base. No stridor. No respiratory distress. She has no wheezes. She has no rales.  Abdominal: Soft. Bowel sounds are normal. She exhibits no distension. There is no tenderness. There is no rebound and no guarding.  Musculoskeletal: right groin cath site looks great. Neurological: She is alert and oriented to person, place, and time. Coordination normal.  Skin: Skin is warm and dry. No rash noted. She is not diaphoretic. No erythema. No pallor.  Psychiatric: She has a normal mood and affect. Her behavior is normal. Judgment and thought content normal.   Impression  1. CAD:  S/p successful PCI of RCA.  She is doing well.  Has been seen by cardiac rehab. 2. DM -  stable restart meds, restart metformin tomorrow 3. Hypothyroidism:  Follow up with her medical doctor   To see Lawson Fiscal / Lorin Picket in several weeks.  To follow up with Dr. Kirke Corin in several months.    Vesta Mixer, Montez Hageman., MD, [facility] 05/26/2013, 7:49 AM Office - [phone] Pager [phone]

## 2013-06-06 ENCOUNTER — Encounter: Payer: Medicare Other | Admitting: Physician Assistant

## 2013-06-09 ENCOUNTER — Encounter: Payer: Self-pay | Admitting: Cardiovascular Disease

## 2013-06-09 ENCOUNTER — Ambulatory Visit (INDEPENDENT_AMBULATORY_CARE_PROVIDER_SITE_OTHER): Payer: Medicare Other | Admitting: Cardiovascular Disease

## 2013-06-09 VITALS — BP 108/58 | HR 61 | Ht 63.0 in | Wt 127.5 lb

## 2013-06-09 DIAGNOSIS — E785 Hyperlipidemia, unspecified: Secondary | ICD-10-CM

## 2013-06-09 DIAGNOSIS — R0602 Shortness of breath: Secondary | ICD-10-CM | POA: Diagnosis not present

## 2013-06-09 DIAGNOSIS — I2510 Atherosclerotic heart disease of native coronary artery without angina pectoris: Secondary | ICD-10-CM

## 2013-06-09 DIAGNOSIS — I10 Essential (primary) hypertension: Secondary | ICD-10-CM

## 2013-06-09 DIAGNOSIS — R079 Chest pain, unspecified: Secondary | ICD-10-CM | POA: Diagnosis not present

## 2013-06-09 MED ORDER — RANOLAZINE ER 500 MG PO TB12: 500.0000 mg | ORAL_TABLET | Freq: Two times a day (BID) | ORAL | Status: DC

## 2013-06-09 NOTE — Progress Notes (Signed)
HPI  This is a 72 year old female who is here today for a followup regarding coronary artery disease status post CABG.  She has known history of diabetes and hypertension. She presented in early June with non-ST elevation myocardial infarction. She underwent cardiac catheterization which showed significant three-vessel coronary artery disease with an ejection fraction of 45-50%. She underwent CABG by Dr. Dorris Fetch without complications. Postop palpitations improved after increasing the dose of metoprolol. She started increasing his physical activities and felt well for a few weeks. She was seen recently for worsening exertional chest tightness and shortness of breath suggestive of class III angina. I proceeded with cardiac catheterization which showed significant 3 vessel CAD with diffuse diabetic vessels. SVG to RPDA/OM3 was found to be occluded. OM3 gets collaterals from RCA. EF was low normal. I performed Successful PCI and DES placement to proximal and mid right coronary artery.  She reports no significant improvement in symptoms. She is taking her medications regularly. She was started on small dose Crestor hospital discharge. She had intolerance to statins in the past.   Allergies  Allergen Reactions  . Amitriptyline Other (See Comments)    disoriented  . Cortisone Other (See Comments)    Eyes hurt and sugar goes up  . Lipitor [Atorvastatin]     Fatigue and weakness and high and low doses   . Macrodantin [Nitrofurantoin] Itching and Swelling  . Motrin [Ibuprofen] Other (See Comments)    Fluid retention     Current Outpatient Prescriptions on File Prior to Visit  Medication Sig Dispense Refill  . acetaminophen (TYLENOL ARTHRITIS PAIN) 650 MG CR tablet Take 1,300 mg by mouth at bedtime.      . Artificial Tear Ointment (ARTIFICIAL TEARS) ointment Place 1 application into both eyes at bedtime.      Marland Kitchen aspirin 81 MG tablet Take 1 tablet (81 mg total) by mouth daily.      .  clopidogrel (PLAVIX) 75 MG tablet Take 1 tablet (75 mg total) by mouth daily with breakfast.  90 tablet  6  . insulin glargine (LANTUS) 100 UNIT/ML injection Inject 35 Units into the skin at bedtime as needed (if sugar is higher than 200). Only if sugar is over 200.      . ketotifen (THERA TEARS ALLERGY) 0.025 % ophthalmic solution Place 1 drop into both eyes at bedtime as needed (for allergy eyes.).       Marland Kitchen levothyroxine (SYNTHROID, LEVOTHROID) 88 MCG tablet Take 88 mcg by mouth daily before breakfast.      . lisinopril (PRINIVIL,ZESTRIL) 5 MG tablet Take 1 tablet (5 mg total) by mouth daily.  90 tablet  3  . LORazepam (ATIVAN) 1 MG tablet Take 2.5 mg by mouth at bedtime.      . metFORMIN (GLUCOPHAGE-XR) 500 MG 24 hr tablet Take 1 tablet (500 mg total) by mouth daily with breakfast.      . metoprolol tartrate (LOPRESSOR) 25 MG tablet Take 1 tablet (25 mg total) by mouth 2 (two) times daily.  180 tablet  3  . nitroGLYCERIN (NITROSTAT) 0.4 MG SL tablet Place 1 tablet (0.4 mg total) under the tongue every 5 (five) minutes as needed for chest pain.  25 tablet  3  . pantoprazole (PROTONIX) 40 MG tablet Take 40 mg by mouth daily.       . rosuvastatin (CRESTOR) 5 MG tablet Take 1 tablet (5 mg total) by mouth daily.  30 tablet  6   No current facility-administered medications on file  prior to visit.     Past Medical History  Diagnosis Date  . Depression   . Hypothyroidism   . GERD (gastroesophageal reflux disease)   . IBS (irritable bowel syndrome)   . Lymphocytosis 03/24/2012  . PONV (postoperative nausea and vomiting)   . Hyperlipidemia   . Coronary artery disease     a. nl myoview ~ 2006;  b. 01/2013 s/p MI and CABG x 4 (VG->D2, VG->RPDA->OM3, LIMA->LAD;  c. 05/2013 Cath/PCI: LM nl, LAD 60-70p/m, D1 70p, LCX 18m, OM2 60p, RCA 50-60p/61m (3.0x33 Xience DES), VG->D2 60, VG->RPDA/OM3 100, LIMA->LAD nl w 70 dLAD, EF 50%.  . Hypertension   . History of pneumonia 1985  . Diabetes mellitus, type 2       "dx'd 1992" (05/25/2013)  . Anemia     "after OHS in 01/2013" (05/25/2013)  . WUJWJXBJ(478.2)     "monthly" (05/25/2013)  . OA (osteoarthritis)   . Arthritis     "joints" (05/25/2013)  . Anxiety      Past Surgical History  Procedure Laterality Date  . Thyroidectomy  2010  . Coronary artery bypass graft N/A 01/07/2013    Procedure: CORONARY ARTERY BYPASS GRAFTING (CABG);  Surgeon: Loreli Slot, MD;  Location: [facility] OR;  Service: Open Heart Surgery;  Laterality: N/A;  . Cardiac catheterization  01/2013  . Carpal tunnel release Bilateral 1990-1992  . Knee arthroscopy Bilateral 1993-1996  . Hammer toe surgery Right 2009  . Bladder suspension  2005  . Vaginal hysterectomy  1974  . Coronary angioplasty with stent placement  05/25/2013    "1" (05/25/2013)     Family History  Problem Relation Age of Onset  . Cancer Mother     bone CA, died @ 77  . Emphysema Father     died @ 60  . Emphysema Brother     died @ 29  . Diabetes Son     alive and well.     History   Social History  . Marital Status: Married    Spouse Name: N/A    Number of Children: N/A  . Years of Education: N/A   Occupational History  . Not on file.   Social History Main Topics  . Smoking status: Never Smoker   . Smokeless tobacco: Never Used  . Alcohol Use: No  . Drug Use: No  . Sexual Activity: Not Currently   Other Topics Concern  . Not on file   Social History Narrative   Lives in [place] with husband.     PHYSICAL EXAM   BP 108/58  Pulse 61  Ht 5\' 3"  (1.6 m)  Wt 127 lb 8 oz (57.834 kg)  BMI 22.59 kg/m2 Constitutional: She is oriented to person, place, and time. She appears well-developed and well-nourished. No distress.  HENT: No nasal discharge.  Head: Normocephalic and atraumatic.  Eyes: Pupils are equal and round. Right eye exhibits no discharge. Left eye exhibits no discharge.  Neck: Normal range of motion. Neck supple. No JVD present. No thyromegaly present.   Cardiovascular: Normal rate, regular rhythm, normal heart sounds. Exam reveals no gallop and no friction rub. No murmur heard.  Pulmonary/Chest: Effort normal and decreased breath sounds at the left base. No stridor. No respiratory distress. She has no wheezes. She has no rales. She exhibits no tenderness.  Abdominal: Soft. Bowel sounds are normal. She exhibits no distension. There is no tenderness. There is no rebound and no guarding.  Musculoskeletal: Normal range of motion. She exhibits no  edema and no tenderness.  Neurological: She is alert and oriented to person, place, and time. Coordination normal.  Skin: Skin is warm and dry. No rash noted. She is not diaphoretic. No erythema. No pallor.  Psychiatric: She has a normal mood and affect. Her behavior is normal. Judgment and thought content normal.  Right groin: No hematoma or bruit.   EKG: Sinus  Rhythm  -Left axis -anterior fascicular block.   ABNORMAL      ASSESSMENT AND PLAN

## 2013-06-09 NOTE — Assessment & Plan Note (Signed)
Unfortunately, the patient has diffuse diabetic vessels. She had  early graft failure supplying right PDA and OM 3. I performed successful PCI and drug-eluting stent placement to the native right coronary artery which provides collaterals to OM3.  there was also disease in the distal LAD beyond the anastomosis. She continues to have significant angina. I will go ahead and start her on Ranexa 500 mg twice daily and try to up titrate these in few weeks if there is good response. Blood pressure is too low to add long-acting nitroglycerin and amlodipine. Continue other cardiac medications.

## 2013-06-09 NOTE — Assessment & Plan Note (Signed)
Continue small dose Crestor. Check fasting and liver profile with next visit.

## 2013-06-09 NOTE — Assessment & Plan Note (Signed)
Blood pressure is well controlled on current medications. 

## 2013-06-09 NOTE — Patient Instructions (Signed)
Start Ranexa 500 mg twice daily.   Follow up in 3 weeks (be fasting for labs).

## 2013-06-24 ENCOUNTER — Ambulatory Visit (INDEPENDENT_AMBULATORY_CARE_PROVIDER_SITE_OTHER): Payer: Medicare Other | Admitting: Cardiovascular Disease

## 2013-06-24 ENCOUNTER — Encounter: Payer: Self-pay | Admitting: Cardiovascular Disease

## 2013-06-24 VITALS — BP 100/64 | HR 72 | Ht 63.0 in | Wt 128.5 lb

## 2013-06-24 DIAGNOSIS — E785 Hyperlipidemia, unspecified: Secondary | ICD-10-CM | POA: Diagnosis not present

## 2013-06-24 DIAGNOSIS — I2510 Atherosclerotic heart disease of native coronary artery without angina pectoris: Secondary | ICD-10-CM

## 2013-06-24 DIAGNOSIS — R0789 Other chest pain: Secondary | ICD-10-CM

## 2013-06-24 DIAGNOSIS — R0602 Shortness of breath: Secondary | ICD-10-CM

## 2013-06-24 DIAGNOSIS — I10 Essential (primary) hypertension: Secondary | ICD-10-CM

## 2013-06-24 MED ORDER — RANOLAZINE ER 1000 MG PO TB12: 1000.0000 mg | ORAL_TABLET | Freq: Two times a day (BID) | ORAL | Status: DC

## 2013-06-24 MED ORDER — ROSUVASTATIN CALCIUM 5 MG PO TABS: 5.0000 mg | ORAL_TABLET | Freq: Every day | ORAL | Status: DC

## 2013-06-24 NOTE — Assessment & Plan Note (Signed)
She improved significantly after the recent addition of Ranexa. He still has mild residual angina. Thus, I recommend increasing the dose to 1000 mg twice daily. I think this is the best medication for her condition especially that her blood pressure tends to run low and thus it is not possible to start her on long-acting nitroglycerin or amlodipine. She is already on a beta blocker. She is going to start cardiac rehabilitation in December.

## 2013-06-24 NOTE — Assessment & Plan Note (Signed)
Blood pressure is well controlled on current medications. 

## 2013-06-24 NOTE — Progress Notes (Signed)
HPI  This is a 72 year old female who is here today for a followup regarding coronary artery disease status post CABG.  She has known history of diabetes and hypertension. She presented in early June with non-ST elevation myocardial infarction. She underwent cardiac catheterization which showed significant three-vessel coronary artery disease with an ejection fraction of 45-50%. She underwent CABG by Dr. Dorris Fetch without complications. Postop palpitations improved after increasing the dose of metoprolol. She started increasing his physical activities and felt well for a few weeks. She was seen recently for worsening exertional chest tightness and shortness of breath suggestive of class III angina. I proceeded with cardiac catheterization which showed significant 3 vessel CAD with diffuse diabetic vessels. SVG to RPDA/OM3 was found to be occluded. OM3 gets collaterals from RCA. EF was low normal. I performed Successful PCI and DES placement to proximal and mid right coronary artery. Left circumflex occlusion was left to be treated medically. She continued to have significant angina even after PCI. She does have diffuse diabetic vessels. I started her on Ranexa 500 mg twice daily with significant improvement in her symptoms. She is not able to walk without getting chest tightness. She is also tolerating small dose Crestor. She did not tolerate statins in the past due to severe myalgia.   Allergies  Allergen Reactions  . Amitriptyline Other (See Comments)    disoriented  . Cortisone Other (See Comments)    Eyes hurt and sugar goes up  . Lipitor [Atorvastatin]     Fatigue and weakness and high and low doses   . Macrodantin [Nitrofurantoin] Itching and Swelling  . Motrin [Ibuprofen] Other (See Comments)    Fluid retention     Current Outpatient Prescriptions on File Prior to Visit  Medication Sig Dispense Refill  . acetaminophen (TYLENOL ARTHRITIS PAIN) 650 MG CR tablet Take 1,300 mg by  mouth at bedtime.      . Artificial Tear Ointment (ARTIFICIAL TEARS) ointment Place 1 application into both eyes at bedtime.      Marland Kitchen aspirin 81 MG tablet Take 1 tablet (81 mg total) by mouth daily.      . clopidogrel (PLAVIX) 75 MG tablet Take 1 tablet (75 mg total) by mouth daily with breakfast.  90 tablet  6  . insulin glargine (LANTUS) 100 UNIT/ML injection Inject 35 Units into the skin at bedtime as needed (if sugar is higher than 200). Only if sugar is over 200.      . ketotifen (THERA TEARS ALLERGY) 0.025 % ophthalmic solution Place 1 drop into both eyes at bedtime as needed (for allergy eyes.).       Marland Kitchen levothyroxine (SYNTHROID, LEVOTHROID) 88 MCG tablet Take 88 mcg by mouth daily before breakfast.      . lisinopril (PRINIVIL,ZESTRIL) 5 MG tablet Take 1 tablet (5 mg total) by mouth daily.  90 tablet  3  . LORazepam (ATIVAN) 1 MG tablet Take 2.5 mg by mouth at bedtime.      . metFORMIN (GLUCOPHAGE-XR) 500 MG 24 hr tablet Take 1 tablet (500 mg total) by mouth daily with breakfast.      . metoprolol tartrate (LOPRESSOR) 25 MG tablet Take 1 tablet (25 mg total) by mouth 2 (two) times daily.  180 tablet  3  . nitroGLYCERIN (NITROSTAT) 0.4 MG SL tablet Place 1 tablet (0.4 mg total) under the tongue every 5 (five) minutes as needed for chest pain.  25 tablet  3  . pantoprazole (PROTONIX) 40 MG tablet Take 40 mg  by mouth daily.        No current facility-administered medications on file prior to visit.     Past Medical History  Diagnosis Date  . Depression   . Hypothyroidism   . GERD (gastroesophageal reflux disease)   . IBS (irritable bowel syndrome)   . Lymphocytosis 03/24/2012  . PONV (postoperative nausea and vomiting)   . Hyperlipidemia   . Coronary artery disease     a. nl myoview ~ 2006;  b. 01/2013 s/p MI and CABG x 4 (VG->D2, VG->RPDA->OM3, LIMA->LAD;  c. 05/2013 Cath/PCI: LM nl, LAD 60-70p/m, D1 70p, LCX 187m, OM2 60p, RCA 50-60p/34m (3.0x33 Xience DES), VG->D2 60, VG->RPDA/OM3 100,  LIMA->LAD nl w 70 dLAD, EF 50%.  . Hypertension   . History of pneumonia 1985  . Diabetes mellitus, type 2     "dx'd 1992" (05/25/2013)  . Anemia     "after OHS in 01/2013" (05/25/2013)  . RUEAVWUJ(811.9)     "monthly" (05/25/2013)  . OA (osteoarthritis)   . Arthritis     "joints" (05/25/2013)  . Anxiety      Past Surgical History  Procedure Laterality Date  . Thyroidectomy  2010  . Coronary artery bypass graft N/A 01/07/2013    Procedure: CORONARY ARTERY BYPASS GRAFTING (CABG);  Surgeon: Loreli Slot, MD;  Location: [facility] OR;  Service: Open Heart Surgery;  Laterality: N/A;  . Cardiac catheterization  01/2013  . Carpal tunnel release Bilateral 1990-1992  . Knee arthroscopy Bilateral 1993-1996  . Hammer toe surgery Right 2009  . Bladder suspension  2005  . Vaginal hysterectomy  1974  . Coronary angioplasty with stent placement  05/25/2013    "1" (05/25/2013)     Family History  Problem Relation Age of Onset  . Cancer Mother     bone CA, died @ 34  . Emphysema Father     died @ 66  . Emphysema Brother     died @ 60  . Diabetes Son     alive and well.     History   Social History  . Marital Status: Married    Spouse Name: N/A    Number of Children: N/A  . Years of Education: N/A   Occupational History  . Not on file.   Social History Main Topics  . Smoking status: Never Smoker   . Smokeless tobacco: Never Used  . Alcohol Use: No  . Drug Use: No  . Sexual Activity: Not Currently   Other Topics Concern  . Not on file   Social History Narrative   Lives in [place] with husband.     PHYSICAL EXAM   BP 100/64  Pulse 72  Ht 5\' 3"  (1.6 m)  Wt 128 lb 8 oz (58.287 kg)  BMI 22.77 kg/m2 Constitutional: She is oriented to person, place, and time. She appears well-developed and well-nourished. No distress.  HENT: No nasal discharge.  Head: Normocephalic and atraumatic.  Eyes: Pupils are equal and round. Right eye exhibits no discharge. Left eye  exhibits no discharge.  Neck: Normal range of motion. Neck supple. No JVD present. No thyromegaly present.  Cardiovascular: Normal rate, regular rhythm, normal heart sounds. Exam reveals no gallop and no friction rub. No murmur heard.  Pulmonary/Chest: Effort normal and decreased breath sounds at the left base. No stridor. No respiratory distress. She has no wheezes. She has no rales. She exhibits no tenderness.  Abdominal: Soft. Bowel sounds are normal. She exhibits no distension. There is no tenderness. There  is no rebound and no guarding.  Musculoskeletal: Normal range of motion. She exhibits no edema and no tenderness.  Neurological: She is alert and oriented to person, place, and time. Coordination normal.  Skin: Skin is warm and dry. No rash noted. She is not diaphoretic. No erythema. No pallor.  Psychiatric: She has a normal mood and affect. Her behavior is normal. Judgment and thought content normal.    EKG: Sinus  Rhythm  WITHIN NORMAL LIMITS     ASSESSMENT AND PLAN

## 2013-06-24 NOTE — Assessment & Plan Note (Signed)
Continue treatment with Crestor. Check fasting lipid and liver profile today. 

## 2013-06-24 NOTE — Patient Instructions (Signed)
Increase Ranexa to 1000 mg twice daily.  Continue other medications.   Labs today.  Follow up in 3 months.

## 2013-06-25 LAB — HEPATIC FUNCTION PANEL
AST: 29 IU/L (ref 0–40)
Albumin: 5 g/dL — ABNORMAL HIGH (ref 3.5–4.8)
Alkaline Phosphatase: 55 IU/L (ref 39–117)
Total Bilirubin: 0.6 mg/dL (ref 0.0–1.2)
Total Protein: 7.2 g/dL (ref 6.0–8.5)

## 2013-06-25 LAB — LIPID PANEL
Chol/HDL Ratio: 3.3 ratio units (ref 0.0–4.4)
Cholesterol, Total: 164 mg/dL (ref 100–199)
HDL: 49 mg/dL (ref >39)
LDL Calculated: 86 mg/dL (ref 0–99)
Triglycerides: 143 mg/dL (ref 0–149)
VLDL Cholesterol Cal: 29 mg/dL (ref 5–40)

## 2013-06-27 ENCOUNTER — Telehealth: Payer: Self-pay

## 2013-06-27 NOTE — Telephone Encounter (Signed)
Message copied by Marilynne Halsted on Mon Jun 27, 2013 10:04 AM ------      Message from: Lorine Bears A      Created: Sat Jun 25, 2013 10:34 AM       Inform patient that labs were normal. Cholesterol was excellent. Cholesterol decreased by half since she started taking Crestor which should be continued. ------

## 2013-06-27 NOTE — Telephone Encounter (Signed)
Spoke w/ pt.  She is aware of results.  

## 2013-07-05 ENCOUNTER — Encounter: Payer: Self-pay | Admitting: Cardiovascular Disease

## 2013-07-05 DIAGNOSIS — Z9861 Coronary angioplasty status: Secondary | ICD-10-CM | POA: Diagnosis not present

## 2013-07-05 DIAGNOSIS — Z5189 Encounter for other specified aftercare: Secondary | ICD-10-CM | POA: Diagnosis not present

## 2013-07-11 ENCOUNTER — Telehealth: Payer: Self-pay

## 2013-07-11 NOTE — Telephone Encounter (Signed)
Pt called and states she had some angina pain last night, stated she took her medication at 8 pm and woke up at 2 am feeling a "stick, not a pain, pressure, just a stick, like a bee sting". Pt states she started cardiac rehab last week, and did not go today. States she just doesn't feel good. Please call. States her phone may hang up the 1st call, but just call back.

## 2013-07-11 NOTE — Telephone Encounter (Signed)
Spoke w/ pt.  She reports that last night, she had chest pain that "would zing, go away, zing, go away" and was relieved when she made herself relax.  Reports that she has had this type of pain before. On last OV, Ranexa increased to 1000mg .  Reports that this is helped w/ her pain but that she has had trouble sleeping since the increase.  States that she did not go to cardiac rehab today b/c she is tired.  She is very pleased with the results, but she just wanted to let our office know for her chart.

## 2013-07-14 ENCOUNTER — Telehealth: Payer: Self-pay | Admitting: *Deleted

## 2013-07-14 NOTE — Telephone Encounter (Signed)
Patient still having bowels issues w/ sharp pains. Please advise. Feels very weak, dizzy and sob.

## 2013-07-14 NOTE — Telephone Encounter (Signed)
Spoke w/ pt.  She reports that she has had diarrhea since yesterday that comes on suddenly and she is unable to make it to the bathroom in time. She states that she took her ranexa this am around 7, sat down to wait on her husband to get up and when she got up to make breakfast she had diarrhea. She states that she had right sided chest pain over her breast yesterday that radiated to her rt arm, so she took a nitro and immediately had diarrhea. She put a diaper on, as she was concerned about messing up anymore clothes. Pt would like to know if she could take phenergan, as she has some in her home.  Advised pt that phenergan is not used for diarrhea. Pt states that she has been on ranexa since Thanksgiving w/ no adverse effects and is not concerned that diarrhea is r/t meds. 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 Kirke Corin is out of the office today and issue needs to be addressed. Pt verbalizes understanding and is agreeable to this.  She will contact us if PCP cannot address issue today.

## 2013-08-04 ENCOUNTER — Encounter: Payer: Self-pay | Admitting: Cardiovascular Disease

## 2013-08-08 ENCOUNTER — Telehealth: Payer: Self-pay | Admitting: *Deleted

## 2013-08-08 DIAGNOSIS — R609 Edema, unspecified: Secondary | ICD-10-CM

## 2013-08-08 NOTE — Telephone Encounter (Signed)
Called patient and set up appt for venous duplex.

## 2013-08-08 NOTE — Telephone Encounter (Signed)
Patient called and having swelling feet to knee. Please advise

## 2013-08-08 NOTE — Telephone Encounter (Signed)
Left voicemail 1/5

## 2013-08-08 NOTE — Telephone Encounter (Signed)
If it is in one side, then it is unlikely to be cardiac. However, we should rule out DVT with venous duplex.

## 2013-08-08 NOTE — Telephone Encounter (Signed)
Patient states she has had mild issues with swelling (usually her right knee)  and usually watches her salt and keeps her feet up. Yesterday her right knee, foot and possibly her calf were more swollen then they had ever been before. She denies weight gain, shortness of breath, temperature difference in legs, and calf pain. She stated that she put her legs up and the swelling is down today. She is going to make appt with her primary MD for her chronic knee issues but she wanted to run this issue by Dr. Fletcher Anon to make sure he did not think it was cardiac related.

## 2013-08-11 ENCOUNTER — Encounter (INDEPENDENT_AMBULATORY_CARE_PROVIDER_SITE_OTHER): Payer: Medicare Other

## 2013-08-11 DIAGNOSIS — M79609 Pain in unspecified limb: Secondary | ICD-10-CM | POA: Diagnosis not present

## 2013-08-11 DIAGNOSIS — M7989 Other specified soft tissue disorders: Secondary | ICD-10-CM | POA: Diagnosis not present

## 2013-08-11 DIAGNOSIS — R609 Edema, unspecified: Secondary | ICD-10-CM

## 2013-09-04 ENCOUNTER — Encounter: Payer: Self-pay | Admitting: Cardiovascular Disease

## 2013-09-15 DIAGNOSIS — E1159 Type 2 diabetes mellitus with other circulatory complications: Secondary | ICD-10-CM | POA: Diagnosis not present

## 2013-09-15 DIAGNOSIS — I2510 Atherosclerotic heart disease of native coronary artery without angina pectoris: Secondary | ICD-10-CM | POA: Diagnosis not present

## 2013-09-15 DIAGNOSIS — IMO0002 Reserved for concepts with insufficient information to code with codable children: Secondary | ICD-10-CM | POA: Diagnosis not present

## 2013-09-15 DIAGNOSIS — I700 Atherosclerosis of aorta: Secondary | ICD-10-CM | POA: Diagnosis not present

## 2013-09-15 DIAGNOSIS — E890 Postprocedural hypothyroidism: Secondary | ICD-10-CM | POA: Diagnosis not present

## 2013-09-15 DIAGNOSIS — K219 Gastro-esophageal reflux disease without esophagitis: Secondary | ICD-10-CM | POA: Diagnosis not present

## 2013-09-15 DIAGNOSIS — M1990 Unspecified osteoarthritis, unspecified site: Secondary | ICD-10-CM | POA: Diagnosis not present

## 2013-09-15 DIAGNOSIS — Z1331 Encounter for screening for depression: Secondary | ICD-10-CM | POA: Diagnosis not present

## 2013-09-29 ENCOUNTER — Ambulatory Visit: Payer: Medicare Other | Admitting: Cardiovascular Disease

## 2013-10-02 ENCOUNTER — Encounter: Payer: Self-pay | Admitting: Cardiovascular Disease

## 2013-10-11 DIAGNOSIS — H4010X0 Unspecified open-angle glaucoma, stage unspecified: Secondary | ICD-10-CM | POA: Diagnosis not present

## 2013-10-14 ENCOUNTER — Encounter: Payer: Self-pay | Admitting: Cardiovascular Disease

## 2013-10-14 ENCOUNTER — Ambulatory Visit (INDEPENDENT_AMBULATORY_CARE_PROVIDER_SITE_OTHER): Payer: Medicare Other | Admitting: Cardiovascular Disease

## 2013-10-14 VITALS — BP 153/72 | HR 54 | Ht 63.0 in | Wt 133.5 lb

## 2013-10-14 DIAGNOSIS — I2510 Atherosclerotic heart disease of native coronary artery without angina pectoris: Secondary | ICD-10-CM | POA: Diagnosis not present

## 2013-10-14 DIAGNOSIS — I10 Essential (primary) hypertension: Secondary | ICD-10-CM | POA: Diagnosis not present

## 2013-10-14 DIAGNOSIS — E785 Hyperlipidemia, unspecified: Secondary | ICD-10-CM

## 2013-10-14 NOTE — Progress Notes (Signed)
HPI  This is a 73 year old female who is here today for a followup regarding coronary artery disease status post CABG and PCI.  She has known history of diabetes and hypertension. She presented in June, 2014 with non-ST elevation myocardial infarction. She underwent cardiac catheterization which showed significant three-vessel coronary artery disease with an ejection fraction of 45-50%. She underwent CABG by Dr. Roxan Hockey without complications.  She had recurrent angina post CABG.  I proceeded with cardiac catheterization which showed significant 3 vessel CAD with diffuse diabetic vessels. SVG to RPDA/OM3 was found to be occluded. OM3 got collaterals from RCA. EF was low normal. I performed Successful PCI and DES placement to proximal and mid right coronary artery. Left circumflex occlusion was left to be treated medically. She continued to have significant angina even after PCI and was started on Ranexa with subsequent improvement in symptoms. She reports complete resolution of chest pain. She continues to have exertional dyspnea but that seems to be gradually improving. She is also tolerating small dose Crestor. She did not tolerate statins in the past due to severe myalgia.   Allergies  Allergen Reactions  . Amitriptyline Other (See Comments)    disoriented  . Cortisone Other (See Comments)    Eyes hurt and sugar goes up  . Lipitor [Atorvastatin]     Fatigue and weakness and high and low doses   . Macrodantin [Nitrofurantoin] Itching and Swelling  . Motrin [Ibuprofen] Other (See Comments)    Fluid retention     Current Outpatient Prescriptions on File Prior to Visit  Medication Sig Dispense Refill  . acetaminophen (TYLENOL ARTHRITIS PAIN) 650 MG CR tablet Take 1,300 mg by mouth at bedtime.      . Artificial Tear Ointment (ARTIFICIAL TEARS) ointment Place 1 application into both eyes at bedtime.      Marland Kitchen aspirin 81 MG tablet Take 1 tablet (81 mg total) by mouth daily.      .  clopidogrel (PLAVIX) 75 MG tablet Take 1 tablet (75 mg total) by mouth daily with breakfast.  90 tablet  6  . insulin glargine (LANTUS) 100 UNIT/ML injection Inject 35 Units into the skin at bedtime as needed (if sugar is higher than 200). Only if sugar is over 200.      . ketotifen (THERA TEARS ALLERGY) 0.025 % ophthalmic solution Place 1 drop into both eyes at bedtime as needed (for allergy eyes.).       Marland Kitchen levothyroxine (SYNTHROID, LEVOTHROID) 88 MCG tablet Take 88 mcg by mouth daily before breakfast.      . lisinopril (PRINIVIL,ZESTRIL) 5 MG tablet Take 1 tablet (5 mg total) by mouth daily.  90 tablet  3  . LORazepam (ATIVAN) 1 MG tablet Take 2.5 mg by mouth at bedtime.      . metFORMIN (GLUCOPHAGE-XR) 500 MG 24 hr tablet Take 1 tablet (500 mg total) by mouth daily with breakfast.      . metoprolol tartrate (LOPRESSOR) 25 MG tablet Take 1 tablet (25 mg total) by mouth 2 (two) times daily.  180 tablet  3  . nitroGLYCERIN (NITROSTAT) 0.4 MG SL tablet Place 1 tablet (0.4 mg total) under the tongue every 5 (five) minutes as needed for chest pain.  25 tablet  3  . pantoprazole (PROTONIX) 40 MG tablet Take 40 mg by mouth daily.       . ranolazine (RANEXA) 1000 MG SR tablet Take 1 tablet (1,000 mg total) by mouth 2 (two) times daily.  [place]  tablet  3  . rosuvastatin (CRESTOR) 5 MG tablet Take 1 tablet (5 mg total) by mouth daily.  90 tablet  3   No current facility-administered medications on file prior to visit.     Past Medical History  Diagnosis Date  . Depression   . Hypothyroidism   . GERD (gastroesophageal reflux disease)   . IBS (irritable bowel syndrome)   . Lymphocytosis 03/24/2012  . PONV (postoperative nausea and vomiting)   . Hyperlipidemia   . Coronary artery disease     a. nl myoview ~ 2006;  b. 01/2013 s/p MI and CABG x 4 (VG->D2, VG->RPDA->OM3, LIMA->LAD;  c. 05/2013 Cath/PCI: LM nl, LAD 60-70p/m, D1 70p, LCX 149m, OM2 60p, RCA 50-60p/74m (3.0x33 Xience DES), VG->D2 60,  VG->RPDA/OM3 100, LIMA->LAD nl w 70 dLAD, EF 50%.  . Hypertension   . History of pneumonia 1985  . Diabetes mellitus, type 2     "dx'd 1992" (05/25/2013)  . Anemia     "after OHS in 01/2013" (05/25/2013)  . MIWOEHOZ(224.8)     "monthly" (05/25/2013)  . OA (osteoarthritis)   . Arthritis     "joints" (05/25/2013)  . Anxiety   . Glaucoma     bilateral     Past Surgical History  Procedure Laterality Date  . Thyroidectomy  2010  . Coronary artery bypass graft N/A 01/07/2013    Procedure: CORONARY ARTERY BYPASS GRAFTING (CABG);  Surgeon: Melrose Nakayama, MD;  Location: [facility];  Service: Open Heart Surgery;  Laterality: N/A;  . Cardiac catheterization  01/2013  . Carpal tunnel release Bilateral 1990-1992  . Knee arthroscopy Bilateral 1993-1996  . Hammer toe surgery Right 2009  . Bladder suspension  2005  . Vaginal hysterectomy  1974  . Coronary angioplasty with stent placement  05/25/2013    "1" (05/25/2013)     Family History  Problem Relation Age of Onset  . Cancer Mother     bone CA, died @ 68  . Emphysema Father     died @ 23  . Emphysema Brother     died @ 57  . Diabetes Son     alive and well.     History   Social History  . Marital Status: Married    Spouse Name: N/A    Number of Children: N/A  . Years of Education: N/A   Occupational History  . Not on file.   Social History Main Topics  . Smoking status: Never Smoker   . Smokeless tobacco: Never Used  . Alcohol Use: No  . Drug Use: No  . Sexual Activity: Not Currently   Other Topics Concern  . Not on file   Social History Narrative   Lives in [place] with husband.     PHYSICAL EXAM   BP 153/72  Pulse 54  Ht 5\' 3"  (1.6 m)  Wt 133 lb 8 oz (60.555 kg)  BMI 23.65 kg/m2 Constitutional: She is oriented to person, place, and time. She appears well-developed and well-nourished. No distress.  HENT: No nasal discharge.  Head: Normocephalic and atraumatic.  Eyes: Pupils are equal and round.  Right eye exhibits no discharge. Left eye exhibits no discharge.  Neck: Normal range of motion. Neck supple. No JVD present. No thyromegaly present.  Cardiovascular: Normal rate, regular rhythm, normal heart sounds. Exam reveals no gallop and no friction rub. No murmur heard.  Pulmonary/Chest: Effort normal and decreased breath sounds at the left base. No stridor. No respiratory distress. She has no wheezes.  She has no rales. She exhibits no tenderness.  Abdominal: Soft. Bowel sounds are normal. She exhibits no distension. There is no tenderness. There is no rebound and no guarding.  Musculoskeletal: Normal range of motion. She exhibits no edema and no tenderness.  Neurological: She is alert and oriented to person, place, and time. Coordination normal.  Skin: Skin is warm and dry. No rash noted. She is not diaphoretic. No erythema. No pallor.  Psychiatric: She has a normal mood and affect. Her behavior is normal. Judgment and thought content normal.        ASSESSMENT AND PLAN

## 2013-10-14 NOTE — Patient Instructions (Signed)
Continue same medications.   Your physician wants you to follow-up in: 6 months.  You will receive a reminder letter in the mail two months in advance. If you don't receive a letter, please call our office to schedule the follow-up appointment.  

## 2013-10-15 NOTE — Assessment & Plan Note (Signed)
She is doing significantly better after the addition and up titration of Ranexa. No further chest discomfort. I recommend continuing medical therapy and increasing physical activities and exercise gradually to improve physical deconditioning.

## 2013-10-15 NOTE — Assessment & Plan Note (Signed)
Blood pressure is well controlled on current medications. 

## 2013-10-15 NOTE — Assessment & Plan Note (Signed)
Continue treatment with small dose rosuvastatin. No significant myalgia. She had significant improvement in lipid profile. Lab Results  Component Value Date   CHOL 229* 01/06/2013   HDL 49 06/24/2013   LDLCALC 86 06/24/2013   TRIG 143 06/24/2013   CHOLHDL 3.3 06/24/2013

## 2013-10-27 DIAGNOSIS — H4010X0 Unspecified open-angle glaucoma, stage unspecified: Secondary | ICD-10-CM | POA: Diagnosis not present

## 2014-01-25 ENCOUNTER — Other Ambulatory Visit: Payer: Self-pay | Admitting: Cardiovascular Disease

## 2014-01-29 ENCOUNTER — Other Ambulatory Visit: Payer: Self-pay | Admitting: Cardiovascular Disease

## 2014-03-23 DIAGNOSIS — E042 Nontoxic multinodular goiter: Secondary | ICD-10-CM | POA: Diagnosis not present

## 2014-03-23 DIAGNOSIS — Z79899 Other long term (current) drug therapy: Secondary | ICD-10-CM | POA: Diagnosis not present

## 2014-03-23 DIAGNOSIS — E1159 Type 2 diabetes mellitus with other circulatory complications: Secondary | ICD-10-CM | POA: Diagnosis not present

## 2014-03-23 DIAGNOSIS — R809 Proteinuria, unspecified: Secondary | ICD-10-CM | POA: Diagnosis not present

## 2014-03-23 DIAGNOSIS — R82998 Other abnormal findings in urine: Secondary | ICD-10-CM | POA: Diagnosis not present

## 2014-03-30 DIAGNOSIS — E1159 Type 2 diabetes mellitus with other circulatory complications: Secondary | ICD-10-CM | POA: Diagnosis not present

## 2014-03-30 DIAGNOSIS — E890 Postprocedural hypothyroidism: Secondary | ICD-10-CM | POA: Diagnosis not present

## 2014-03-30 DIAGNOSIS — F329 Major depressive disorder, single episode, unspecified: Secondary | ICD-10-CM | POA: Diagnosis not present

## 2014-03-30 DIAGNOSIS — Z23 Encounter for immunization: Secondary | ICD-10-CM | POA: Diagnosis not present

## 2014-03-30 DIAGNOSIS — K219 Gastro-esophageal reflux disease without esophagitis: Secondary | ICD-10-CM | POA: Diagnosis not present

## 2014-03-30 DIAGNOSIS — I700 Atherosclerosis of aorta: Secondary | ICD-10-CM | POA: Diagnosis not present

## 2014-03-30 DIAGNOSIS — E042 Nontoxic multinodular goiter: Secondary | ICD-10-CM | POA: Diagnosis not present

## 2014-03-30 DIAGNOSIS — Z1331 Encounter for screening for depression: Secondary | ICD-10-CM | POA: Diagnosis not present

## 2014-03-30 DIAGNOSIS — F3289 Other specified depressive episodes: Secondary | ICD-10-CM | POA: Diagnosis not present

## 2014-03-30 DIAGNOSIS — Z0000 Encounter for general adult medical examination without abnormal findings: Secondary | ICD-10-CM | POA: Diagnosis not present

## 2014-03-30 DIAGNOSIS — I2510 Atherosclerotic heart disease of native coronary artery without angina pectoris: Secondary | ICD-10-CM | POA: Diagnosis not present

## 2014-04-03 DIAGNOSIS — Z1212 Encounter for screening for malignant neoplasm of rectum: Secondary | ICD-10-CM | POA: Diagnosis not present

## 2014-04-14 ENCOUNTER — Encounter: Payer: Self-pay | Admitting: Cardiovascular Disease

## 2014-04-14 ENCOUNTER — Ambulatory Visit (INDEPENDENT_AMBULATORY_CARE_PROVIDER_SITE_OTHER): Payer: Medicare Other | Admitting: Cardiovascular Disease

## 2014-04-14 VITALS — BP 110/60 | HR 59 | Ht 64.0 in | Wt 138.1 lb

## 2014-04-14 DIAGNOSIS — E785 Hyperlipidemia, unspecified: Secondary | ICD-10-CM

## 2014-04-14 DIAGNOSIS — I209 Angina pectoris, unspecified: Secondary | ICD-10-CM

## 2014-04-14 DIAGNOSIS — I10 Essential (primary) hypertension: Secondary | ICD-10-CM

## 2014-04-14 DIAGNOSIS — R600 Localized edema: Secondary | ICD-10-CM | POA: Insufficient documentation

## 2014-04-14 DIAGNOSIS — I2510 Atherosclerotic heart disease of native coronary artery without angina pectoris: Secondary | ICD-10-CM | POA: Diagnosis not present

## 2014-04-14 DIAGNOSIS — R609 Edema, unspecified: Secondary | ICD-10-CM

## 2014-04-14 DIAGNOSIS — R0602 Shortness of breath: Secondary | ICD-10-CM | POA: Diagnosis not present

## 2014-04-14 DIAGNOSIS — I25119 Atherosclerotic heart disease of native coronary artery with unspecified angina pectoris: Secondary | ICD-10-CM

## 2014-04-14 NOTE — Patient Instructions (Signed)
Use knee high support stockings during the day.   Continue same medications.   Your physician wants you to follow-up in: 6 months.  You will receive a reminder letter in the mail two months in advance. If you don't receive a letter, please call our office to schedule the follow-up appointment.

## 2014-04-14 NOTE — Assessment & Plan Note (Signed)
Blood pressure is well controlled. Continue same medications.

## 2014-04-14 NOTE — Assessment & Plan Note (Signed)
She is doing very well right now with very mild angina. Continue medical therapy. Symptoms improved significantly with Ranexa.

## 2014-04-14 NOTE — Assessment & Plan Note (Signed)
I reviewed recent labs which showed an LDL of 122. She has not been able to tolerate higher dose of rosuvastatin due to myalgia. Continue same management. We can consider adding Zetia in the future.

## 2014-04-14 NOTE — Assessment & Plan Note (Signed)
Likely due to chronic venous insufficiency. I instructed her to start using knee-high support stockings.

## 2014-04-14 NOTE — Progress Notes (Signed)
HPI  This is a 73 year old female who is here today for a followup regarding coronary artery disease status post CABG and PCI.  She has known history of diabetes and hypertension. She presented in June, 2014 with non-ST elevation myocardial infarction. She underwent cardiac catheterization which showed significant three-vessel coronary artery disease with an ejection fraction of 45-50%. She underwent CABG by Dr. Roxan Hockey without complications.  She had recurrent angina post CABG.  I proceeded with cardiac catheterization which showed significant 3 vessel CAD with diffuse diabetic vessels. SVG to RPDA/OM3 was found to be occluded. OM3 got collaterals from RCA. EF was low normal. I performed Successful PCI and DES placement to proximal and mid right coronary artery. Left circumflex occlusion was left to be treated medically. She continued to have significant angina even after PCI and was started on Ranexa with subsequent improvement in symptoms. She is also tolerating small dose Crestor. She did not tolerate statins in the past due to severe myalgia. Overall, she feels significantly better with improvement in angina. She gets mild chest discomfort with over exertion but not very frequently. She has been under significant stress echo due to terminal illness of her husband who has dementia.  Allergies  Allergen Reactions  . Amitriptyline Other (See Comments)    disoriented  . Cortisone Other (See Comments)    Eyes hurt and sugar goes up  . Lipitor [Atorvastatin]     Fatigue and weakness and high and low doses   . Macrodantin [Nitrofurantoin] Itching and Swelling  . Motrin [Ibuprofen] Other (See Comments)    Fluid retention     Current Outpatient Prescriptions on File Prior to Visit  Medication Sig Dispense Refill  . acetaminophen (TYLENOL ARTHRITIS PAIN) 650 MG CR tablet Take 1,300 mg by mouth at bedtime.      . Artificial Tear Ointment (ARTIFICIAL TEARS) ointment Place 1 application  into both eyes at bedtime.      Marland Kitchen aspirin 81 MG tablet Take 1 tablet (81 mg total) by mouth daily.      . clopidogrel (PLAVIX) 75 MG tablet Take 1 tablet (75 mg total) by mouth daily with breakfast.  90 tablet  6  . insulin glargine (LANTUS) 100 UNIT/ML injection Inject 35 Units into the skin at bedtime as needed (if sugar is higher than 200). Only if sugar is over 200.      . ketotifen (THERA TEARS ALLERGY) 0.025 % ophthalmic solution Place 1 drop into both eyes at bedtime as needed (for allergy eyes.).       Marland Kitchen latanoprost (XALATAN) 0.005 % ophthalmic solution Place 1 drop into both eyes at bedtime.       Marland Kitchen levothyroxine (SYNTHROID, LEVOTHROID) 88 MCG tablet Take 88 mcg by mouth daily before breakfast.      . lisinopril (PRINIVIL,ZESTRIL) 5 MG tablet TAKE 1 TABLET BY MOUTH DAILY  90 tablet  3  . LORazepam (ATIVAN) 1 MG tablet Take 2.5 mg by mouth at bedtime.      . metFORMIN (GLUCOPHAGE-XR) 500 MG 24 hr tablet Take 1 tablet (500 mg total) by mouth daily with breakfast.      . metoprolol tartrate (LOPRESSOR) 25 MG tablet TAKE 1 TABLET BY MOUTH TWICE A DAY  180 tablet  3  . nitroGLYCERIN (NITROSTAT) 0.4 MG SL tablet Place 1 tablet (0.4 mg total) under the tongue every 5 (five) minutes as needed for chest pain.  25 tablet  3  . pantoprazole (PROTONIX) 40 MG tablet Take 40  mg by mouth daily.       . ranolazine (RANEXA) 1000 MG SR tablet Take 1 tablet (1,000 mg total) by mouth 2 (two) times daily.  180 tablet  3  . rosuvastatin (CRESTOR) 5 MG tablet Take 1 tablet (5 mg total) by mouth daily.  90 tablet  3   No current facility-administered medications on file prior to visit.     Past Medical History  Diagnosis Date  . Depression   . Hypothyroidism   . GERD (gastroesophageal reflux disease)   . IBS (irritable bowel syndrome)   . Lymphocytosis 03/24/2012  . PONV (postoperative nausea and vomiting)   . Hyperlipidemia   . Coronary artery disease     a. nl myoview ~ 2006;  b. 01/2013 s/p MI and  CABG x 4 (VG->D2, VG->RPDA->OM3, LIMA->LAD;  c. 05/2013 Cath/PCI: LM nl, LAD 60-70p/m, D1 70p, LCX 19m, OM2 60p, RCA 50-60p/62m (3.0x33 Xience DES), VG->D2 60, VG->RPDA/OM3 100, LIMA->LAD nl w 70 dLAD, EF 50%.  . Hypertension   . History of pneumonia 1985  . Diabetes mellitus, type 2     "dx'd 1992" (05/25/2013)  . Anemia     "after OHS in 01/2013" (05/25/2013)  . NKNLZJQB(341.9)     "monthly" (05/25/2013)  . OA (osteoarthritis)   . Arthritis     "joints" (05/25/2013)  . Anxiety   . Glaucoma     bilateral     Past Surgical History  Procedure Laterality Date  . Thyroidectomy  2010  . Coronary artery bypass graft N/A 01/07/2013    Procedure: CORONARY ARTERY BYPASS GRAFTING (CABG);  Surgeon: Melrose Nakayama, MD;  Location: [facility];  Service: Open Heart Surgery;  Laterality: N/A;  . Cardiac catheterization  01/2013  . Carpal tunnel release Bilateral 1990-1992  . Knee arthroscopy Bilateral 1993-1996  . Hammer toe surgery Right 2009  . Bladder suspension  2005  . Vaginal hysterectomy  1974  . Coronary angioplasty with stent placement  05/25/2013    "1" (05/25/2013)     Family History  Problem Relation Age of Onset  . Cancer Mother     bone CA, died @ 36  . Emphysema Father     died @ 74  . Emphysema Brother     died @ 38  . Diabetes Son     alive and well.     History   Social History  . Marital Status: Married    Spouse Name: N/A    Number of Children: N/A  . Years of Education: N/A   Occupational History  . Not on file.   Social History Main Topics  . Smoking status: Never Smoker   . Smokeless tobacco: Never Used  . Alcohol Use: No  . Drug Use: No  . Sexual Activity: Not Currently   Other Topics Concern  . Not on file   Social History Narrative   Lives in [place] with husband.     PHYSICAL EXAM   BP 110/60  Pulse 59  Ht 5\' 4"  (1.626 m)  Wt 138 lb 1.6 oz (62.642 kg)  BMI 23.69 kg/m2 Constitutional: She is oriented to person, place, and  time. She appears well-developed and well-nourished. No distress.  HENT: No nasal discharge.  Head: Normocephalic and atraumatic.  Eyes: Pupils are equal and round. Right eye exhibits no discharge. Left eye exhibits no discharge.  Neck: Normal range of motion. Neck supple. No JVD present. No thyromegaly present.  Cardiovascular: Normal rate, regular rhythm, normal heart sounds. Exam  reveals no gallop and no friction rub. No murmur heard.  Pulmonary/Chest: Effort normal and decreased breath sounds at the left base. No stridor. No respiratory distress. She has no wheezes. She has no rales. She exhibits no tenderness.  Abdominal: Soft. Bowel sounds are normal. She exhibits no distension. There is no tenderness. There is no rebound and no guarding.  Musculoskeletal: Normal range of motion. She exhibits no edema and no tenderness.  Neurological: She is alert and oriented to person, place, and time. Coordination normal.  Skin: Skin is warm and dry. No rash noted. She is not diaphoretic. No erythema. No pallor.  Psychiatric: She has a normal mood and affect. Her behavior is normal. Judgment and thought content normal.    HTX:HFSFS  Bradycardia  WITHIN NORMAL LIMITS    ASSESSMENT AND PLAN

## 2014-04-25 DIAGNOSIS — H4010X0 Unspecified open-angle glaucoma, stage unspecified: Secondary | ICD-10-CM | POA: Diagnosis not present

## 2014-06-16 ENCOUNTER — Other Ambulatory Visit: Payer: Self-pay | Admitting: Cardiovascular Disease

## 2014-06-21 DIAGNOSIS — H179 Unspecified corneal scar and opacity: Secondary | ICD-10-CM | POA: Diagnosis not present

## 2014-07-04 ENCOUNTER — Other Ambulatory Visit: Payer: Self-pay | Admitting: Cardiovascular Disease

## 2014-07-13 ENCOUNTER — Encounter (HOSPITAL_COMMUNITY): Payer: Self-pay | Admitting: Cardiovascular Disease

## 2014-07-18 ENCOUNTER — Other Ambulatory Visit: Payer: Self-pay | Admitting: Nurse Practitioner

## 2014-08-21 ENCOUNTER — Telehealth: Payer: Self-pay

## 2014-08-21 ENCOUNTER — Observation Stay: Payer: Self-pay | Admitting: Internal Medicine

## 2014-08-21 DIAGNOSIS — I10 Essential (primary) hypertension: Secondary | ICD-10-CM

## 2014-08-21 DIAGNOSIS — Z7902 Long term (current) use of antithrombotics/antiplatelets: Secondary | ICD-10-CM | POA: Diagnosis not present

## 2014-08-21 DIAGNOSIS — R079 Chest pain, unspecified: Secondary | ICD-10-CM | POA: Diagnosis not present

## 2014-08-21 DIAGNOSIS — J9811 Atelectasis: Secondary | ICD-10-CM | POA: Diagnosis not present

## 2014-08-21 DIAGNOSIS — E119 Type 2 diabetes mellitus without complications: Secondary | ICD-10-CM | POA: Diagnosis not present

## 2014-08-21 DIAGNOSIS — R0789 Other chest pain: Secondary | ICD-10-CM | POA: Diagnosis not present

## 2014-08-21 DIAGNOSIS — E785 Hyperlipidemia, unspecified: Secondary | ICD-10-CM

## 2014-08-21 DIAGNOSIS — E118 Type 2 diabetes mellitus with unspecified complications: Secondary | ICD-10-CM | POA: Diagnosis not present

## 2014-08-21 DIAGNOSIS — Z825 Family history of asthma and other chronic lower respiratory diseases: Secondary | ICD-10-CM | POA: Diagnosis not present

## 2014-08-21 DIAGNOSIS — Z794 Long term (current) use of insulin: Secondary | ICD-10-CM | POA: Diagnosis not present

## 2014-08-21 DIAGNOSIS — K219 Gastro-esophageal reflux disease without esophagitis: Secondary | ICD-10-CM | POA: Diagnosis not present

## 2014-08-21 DIAGNOSIS — Z79899 Other long term (current) drug therapy: Secondary | ICD-10-CM | POA: Diagnosis not present

## 2014-08-21 DIAGNOSIS — E039 Hypothyroidism, unspecified: Secondary | ICD-10-CM | POA: Diagnosis not present

## 2014-08-21 DIAGNOSIS — R001 Bradycardia, unspecified: Secondary | ICD-10-CM | POA: Diagnosis not present

## 2014-08-21 DIAGNOSIS — Z955 Presence of coronary angioplasty implant and graft: Secondary | ICD-10-CM | POA: Diagnosis not present

## 2014-08-21 DIAGNOSIS — Z951 Presence of aortocoronary bypass graft: Secondary | ICD-10-CM | POA: Diagnosis not present

## 2014-08-21 DIAGNOSIS — M1990 Unspecified osteoarthritis, unspecified site: Secondary | ICD-10-CM | POA: Diagnosis not present

## 2014-08-21 DIAGNOSIS — Z7982 Long term (current) use of aspirin: Secondary | ICD-10-CM | POA: Diagnosis not present

## 2014-08-21 DIAGNOSIS — I200 Unstable angina: Secondary | ICD-10-CM | POA: Diagnosis not present

## 2014-08-21 DIAGNOSIS — I700 Atherosclerosis of aorta: Secondary | ICD-10-CM | POA: Diagnosis not present

## 2014-08-21 DIAGNOSIS — I25110 Atherosclerotic heart disease of native coronary artery with unstable angina pectoris: Secondary | ICD-10-CM | POA: Diagnosis not present

## 2014-08-21 DIAGNOSIS — Z809 Family history of malignant neoplasm, unspecified: Secondary | ICD-10-CM | POA: Diagnosis not present

## 2014-08-21 LAB — CBC
HCT: 34.8 % — ABNORMAL LOW (ref 35.0–47.0)
HGB: 11.7 g/dL — ABNORMAL LOW (ref 12.0–16.0)
MCH: 33.2 pg (ref 26.0–34.0)
MCHC: 33.7 g/dL (ref 32.0–36.0)
MCV: 99 fL (ref 80–100)
Platelet: 257 10*3/uL (ref 150–440)
RBC: 3.54 10*6/uL — ABNORMAL LOW (ref 3.80–5.20)
RDW: 12 % (ref 11.5–14.5)
WBC: 6.3 10*3/uL (ref 3.6–11.0)

## 2014-08-21 LAB — COMPREHENSIVE METABOLIC PANEL
ALK PHOS: 49 U/L
ALT: 23 U/L
Albumin: 3.8 g/dL (ref 3.4–5.0)
Anion Gap: 7 (ref 7–16)
BUN: 17 mg/dL (ref 7–18)
Bilirubin,Total: 0.5 mg/dL (ref 0.2–1.0)
CALCIUM: 8.2 mg/dL — AB (ref 8.5–10.1)
Chloride: 109 mmol/L — ABNORMAL HIGH (ref 98–107)
Co2: 25 mmol/L (ref 21–32)
Creatinine: 1.02 mg/dL (ref 0.60–1.30)
EGFR (African American): >60
GFR CALC NON AF AMER: 56 — AB
Glucose: 136 mg/dL — ABNORMAL HIGH (ref 65–99)
Osmolality: 285 (ref 275–301)
Potassium: 4.5 mmol/L (ref 3.5–5.1)
SGOT(AST): 23 U/L (ref 15–37)
Sodium: 141 mmol/L (ref 136–145)
Total Protein: 7.1 g/dL (ref 6.4–8.2)

## 2014-08-21 LAB — TROPONIN I: Troponin-I: <0.02 ng/mL

## 2014-08-21 NOTE — Telephone Encounter (Signed)
Pt currently in [facility] ED. 

## 2014-08-21 NOTE — Telephone Encounter (Signed)
Pt daughter called, states on Saturday pt was awaken at 3am with CP, had to take a Nitro, also states this happened again this morning, and she had to take 2 Nitro this morning at 3 am, States pt is not feeling good, very weak. Please call and advise.

## 2014-08-21 NOTE — Telephone Encounter (Signed)
Agree with ED evaluation. 

## 2014-08-21 NOTE — Telephone Encounter (Signed)
Spoke w/ pt's daughter. She reports that pt was woken up by chest pain at 3 am, pain relieved by nitro.  Reports that pt took 2 nitro this am, pain radiated to left arm, pt feels weak and looks clammy.  Denies SOB or n/v.  She reports that pt's sx are similar to previous MI. Advised her to call 911 and have EMS come out and check pt, as she does not want to go to ED.  Daughter verbalizes understanding, though she states that she will "discuss this" w/ her mother and I had to reiterate this several times to get her to hang up, as she wanted to discuss pt's med hx.

## 2014-08-22 ENCOUNTER — Telehealth: Payer: Self-pay

## 2014-08-22 DIAGNOSIS — E119 Type 2 diabetes mellitus without complications: Secondary | ICD-10-CM | POA: Diagnosis not present

## 2014-08-22 DIAGNOSIS — R079 Chest pain, unspecified: Secondary | ICD-10-CM | POA: Diagnosis not present

## 2014-08-22 DIAGNOSIS — I10 Essential (primary) hypertension: Secondary | ICD-10-CM | POA: Diagnosis not present

## 2014-08-22 DIAGNOSIS — I25810 Atherosclerosis of coronary artery bypass graft(s) without angina pectoris: Secondary | ICD-10-CM | POA: Diagnosis not present

## 2014-08-22 DIAGNOSIS — I208 Other forms of angina pectoris: Secondary | ICD-10-CM | POA: Diagnosis not present

## 2014-08-22 NOTE — Telephone Encounter (Signed)
Patient contacted regarding discharge from [facility] on 08/22/14.  Patient understands to follow up with Dr. Fletcher Anon on 09/07/14 at 10:15 am at [facility]. Patient understands discharge instructions? yes Patient understands medications and regiment? yes Patient understands to bring all medications to this visit? yes

## 2014-08-28 ENCOUNTER — Encounter: Payer: Self-pay | Admitting: Cardiovascular Disease

## 2014-09-07 ENCOUNTER — Encounter: Payer: Self-pay | Admitting: Cardiovascular Disease

## 2014-09-07 ENCOUNTER — Ambulatory Visit (INDEPENDENT_AMBULATORY_CARE_PROVIDER_SITE_OTHER): Payer: Medicare Other | Admitting: Cardiovascular Disease

## 2014-09-07 VITALS — BP 130/70 | HR 70 | Ht 64.0 in | Wt 137.5 lb

## 2014-09-07 DIAGNOSIS — I10 Essential (primary) hypertension: Secondary | ICD-10-CM

## 2014-09-07 DIAGNOSIS — I2510 Atherosclerotic heart disease of native coronary artery without angina pectoris: Secondary | ICD-10-CM | POA: Diagnosis not present

## 2014-09-07 DIAGNOSIS — E785 Hyperlipidemia, unspecified: Secondary | ICD-10-CM

## 2014-09-07 DIAGNOSIS — R079 Chest pain, unspecified: Secondary | ICD-10-CM

## 2014-09-07 NOTE — Assessment & Plan Note (Signed)
She has intolerance to statins due to myalgia but has been  Tolerating small dose Crestor.

## 2014-09-07 NOTE — Assessment & Plan Note (Signed)
She is doing reasonably well after recent hospitalization for chest pain. Nuclear stress test showed no evidence of ischemia with normal ejection fraction. Continue medical therapy.

## 2014-09-07 NOTE — Progress Notes (Signed)
HPI  This is a 74 year old female who is here today for a followup regarding coronary artery disease status post CABG and PCI.  She has known history of diabetes and hypertension. She presented in June, 2014 with non-ST elevation myocardial infarction. She underwent cardiac catheterization which showed significant three-vessel coronary artery disease with an ejection fraction of 45-50%. She underwent CABG by Dr. Roxan Hockey without complications.  She had recurrent angina post CABG.  I proceeded with cardiac catheterization which showed significant 3 vessel CAD with diffuse diabetic vessels. SVG to RPDA/OM3 was found to be occluded. OM3 got collaterals from RCA. EF was low normal. I performed Successful PCI and DES placement to proximal and mid right coronary artery. Left circumflex occlusion was left to be treated medically. She continued to have significant angina even after PCI and was started on Ranexa with subsequent improvement in symptoms. She is also tolerating small dose Crestor. She did not tolerate statins in the past due to severe myalgia. She was hospitalized recently at [facility] for chest pain that responded to nitroglycerin. She ruled out for myocardial infarction. She underwent a pharmacologic nuclear stress test which showed no evidence of ischemia with normal ejection fraction. She is overall feeling better.  Allergies  Allergen Reactions  . Amitriptyline Other (See Comments)    disoriented  . Cortisone Other (See Comments)    Eyes hurt and sugar goes up  . Lipitor [Atorvastatin]     Fatigue and weakness and high and low doses   . Macrodantin [Nitrofurantoin] Itching and Swelling  . Motrin [Ibuprofen] Other (See Comments)    Fluid retention     Current Outpatient Prescriptions on File Prior to Visit  Medication Sig Dispense Refill  . acetaminophen (TYLENOL ARTHRITIS PAIN) 650 MG CR tablet Take 1,300 mg by mouth at bedtime.    . Artificial Tear Ointment (ARTIFICIAL TEARS)  ointment Place 1 application into both eyes at bedtime.    Marland Kitchen aspirin 81 MG tablet Take 1 tablet (81 mg total) by mouth daily.    . clopidogrel (PLAVIX) 75 MG tablet TAKE 1 TABLET (75 MG TOTAL) BY MOUTH DAILY WITH BREAKFAST. 90 tablet 4  . CRESTOR 5 MG tablet TAKE 1 TABLET (5 MG TOTAL) BY MOUTH DAILY. 90 tablet 3  . insulin glargine (LANTUS) 100 UNIT/ML injection Inject 35 Units into the skin at bedtime as needed (if sugar is higher than 200). Only if sugar is over 200.    . ketotifen (THERA TEARS ALLERGY) 0.025 % ophthalmic solution Place 1 drop into both eyes as needed (for allergy eyes.).     Marland Kitchen latanoprost (XALATAN) 0.005 % ophthalmic solution Place 1 drop into both eyes at bedtime.     Marland Kitchen levothyroxine (SYNTHROID, LEVOTHROID) 88 MCG tablet Take 88 mcg by mouth daily before breakfast.    . lisinopril (PRINIVIL,ZESTRIL) 5 MG tablet TAKE 1 TABLET BY MOUTH DAILY 90 tablet 3  . lisinopril (PRINIVIL,ZESTRIL) 5 MG tablet TAKE 1 TABLET BY MOUTH DAILY 90 tablet 3  . LORazepam (ATIVAN) 1 MG tablet Take 2.5 mg by mouth at bedtime.    . metFORMIN (GLUCOPHAGE-XR) 500 MG 24 hr tablet Take 1 tablet (500 mg total) by mouth daily with breakfast. (Patient taking differently: Take 500 mg by mouth daily. )    . metoprolol tartrate (LOPRESSOR) 25 MG tablet TAKE 1 TABLET BY MOUTH TWICE A DAY 180 tablet 3  . nitroGLYCERIN (NITROSTAT) 0.4 MG SL tablet Place 1 tablet (0.4 mg total) under the tongue every 5 (  five) minutes as needed for chest pain. 25 tablet 3  . pantoprazole (PROTONIX) 40 MG tablet Take 40 mg by mouth daily.     Marland Kitchen RANEXA 1000 MG SR tablet TAKE 1 TABLET (1,000 MG TOTAL) BY MOUTH 2 (TWO) TIMES DAILY. 180 tablet 3   No current facility-administered medications on file prior to visit.     Past Medical History  Diagnosis Date  . Depression   . Hypothyroidism   . GERD (gastroesophageal reflux disease)   . IBS (irritable bowel syndrome)   . Lymphocytosis 03/24/2012  . PONV (postoperative nausea and  vomiting)   . Hyperlipidemia   . Coronary artery disease     a. nl myoview ~ 2006;  b. 01/2013 s/p MI and CABG x 4 (VG->D2, VG->RPDA->OM3, LIMA->LAD;  c. 05/2013 Cath/PCI: LM nl, LAD 60-70p/m, D1 70p, LCX 184m, OM2 60p, RCA 50-60p/51m (3.0x33 Xience DES), VG->D2 60, VG->RPDA/OM3 100, LIMA->LAD nl w 70 dLAD, EF 50%.  . Hypertension   . History of pneumonia 1985  . Diabetes mellitus, type 2     "dx'd 1992" (05/25/2013)  . Anemia     "after OHS in 01/2013" (05/25/2013)  . HVFMBBUY(370.9)     "monthly" (05/25/2013)  . OA (osteoarthritis)   . Arthritis     "joints" (05/25/2013)  . Anxiety   . Glaucoma     bilateral     Past Surgical History  Procedure Laterality Date  . Thyroidectomy  2010  . Coronary artery bypass graft N/A 01/07/2013    Procedure: CORONARY ARTERY BYPASS GRAFTING (CABG);  Surgeon: Melrose Nakayama, MD;  Location: [place];  Service: Open Heart Surgery;  Laterality: N/A;  . Cardiac catheterization  01/2013  . Carpal tunnel release Bilateral 1990-1992  . Knee arthroscopy Bilateral 1993-1996  . Hammer toe surgery Right 2009  . Bladder suspension  2005  . Vaginal hysterectomy  1974  . Coronary angioplasty with stent placement  05/25/2013    "1" (05/25/2013)  . Left heart catheterization with coronary angiogram N/A 01/05/2013    Procedure: LEFT HEART CATHETERIZATION WITH CORONARY ANGIOGRAM;  Surgeon: Wellington Hampshire, MD;  Location: [place] CATH LAB;  Service: Cardiovascular;  Laterality: N/A;  . Left heart catheterization with coronary/graft angiogram N/A 05/25/2013    Procedure: LEFT HEART CATHETERIZATION WITH Beatrix Fetters;  Surgeon: Wellington Hampshire, MD;  Location: [place] CATH LAB;  Service: Cardiovascular;  Laterality: N/A;  . Percutaneous coronary stent intervention (pci-s)  05/25/2013    Procedure: PERCUTANEOUS CORONARY STENT INTERVENTION (PCI-S);  Surgeon: Wellington Hampshire, MD;  Location: [facility] CATH LAB;  Service: Cardiovascular;;     Family History  Problem  Relation Age of Onset  . Cancer Mother     bone CA, died @ 53  . Emphysema Father     died @ 10  . Emphysema Brother     died @ 64  . Diabetes Son     alive and well.     History   Social History  . Marital Status: Married    Spouse Name: N/A    Number of Children: N/A  . Years of Education: N/A   Occupational History  . Not on file.   Social History Main Topics  . Smoking status: Never Smoker   . Smokeless tobacco: Never Used  . Alcohol Use: No  . Drug Use: No  . Sexual Activity: Not Currently   Other Topics Concern  . Not on file   Social History Narrative   Lives in [place] with  husband.     PHYSICAL EXAM   BP 130/70 mmHg  Pulse 70  Ht 5\' 4"  (1.626 m)  Wt 137 lb 8 oz (62.37 kg)  BMI 23.59 kg/m2 Constitutional: She is oriented to person, place, and time. She appears well-developed and well-nourished. No distress.  HENT: No nasal discharge.  Head: Normocephalic and atraumatic.  Eyes: Pupils are equal and round. Right eye exhibits no discharge. Left eye exhibits no discharge.  Neck: Normal range of motion. Neck supple. No JVD present. No thyromegaly present.  Cardiovascular: Normal rate, regular rhythm, normal heart sounds. Exam reveals no gallop and no friction rub. No murmur heard.  Pulmonary/Chest: Effort normal and decreased breath sounds at the left base. No stridor. No respiratory distress. She has no wheezes. She has no rales. She exhibits no tenderness.  Abdominal: Soft. Bowel sounds are normal. She exhibits no distension. There is no tenderness. There is no rebound and no guarding.  Musculoskeletal: Normal range of motion. She exhibits no edema and no tenderness.  Neurological: She is alert and oriented to person, place, and time. Coordination normal.  Skin: Skin is warm and dry. No rash noted. She is not diaphoretic. No erythema. No pallor.  Psychiatric: She has a normal mood and affect. Her behavior is normal. Judgment and thought content normal.     EKG:  Sinus  Rhythm  -RSR(V1) -nondiagnostic.   -Left axis -anterior fascicular block.   ABNORMAL     ASSESSMENT AND PLAN

## 2014-09-07 NOTE — Assessment & Plan Note (Signed)
Blood pressure is well controlled on current medications. 

## 2014-09-07 NOTE — Patient Instructions (Signed)
Continue same medications.   Your physician wants you to follow-up in: 6 months. You will receive a reminder letter in the mail two months in advance. If you don't receive a letter, please call our office to schedule the follow-up appointment.  Cancel appointment in March.

## 2014-09-20 DIAGNOSIS — Z6825 Body mass index (BMI) 25.0-25.9, adult: Secondary | ICD-10-CM | POA: Diagnosis not present

## 2014-09-20 DIAGNOSIS — E1151 Type 2 diabetes mellitus with diabetic peripheral angiopathy without gangrene: Secondary | ICD-10-CM | POA: Diagnosis not present

## 2014-09-20 DIAGNOSIS — I209 Angina pectoris, unspecified: Secondary | ICD-10-CM | POA: Diagnosis not present

## 2014-09-20 DIAGNOSIS — I2510 Atherosclerotic heart disease of native coronary artery without angina pectoris: Secondary | ICD-10-CM | POA: Diagnosis not present

## 2014-09-27 DIAGNOSIS — M1990 Unspecified osteoarthritis, unspecified site: Secondary | ICD-10-CM | POA: Diagnosis not present

## 2014-09-27 DIAGNOSIS — E890 Postprocedural hypothyroidism: Secondary | ICD-10-CM | POA: Diagnosis not present

## 2014-09-27 DIAGNOSIS — E042 Nontoxic multinodular goiter: Secondary | ICD-10-CM | POA: Diagnosis not present

## 2014-09-27 DIAGNOSIS — Z780 Asymptomatic menopausal state: Secondary | ICD-10-CM | POA: Diagnosis not present

## 2014-09-27 DIAGNOSIS — E1151 Type 2 diabetes mellitus with diabetic peripheral angiopathy without gangrene: Secondary | ICD-10-CM | POA: Diagnosis not present

## 2014-09-27 DIAGNOSIS — K589 Irritable bowel syndrome without diarrhea: Secondary | ICD-10-CM | POA: Diagnosis not present

## 2014-09-27 DIAGNOSIS — I2510 Atherosclerotic heart disease of native coronary artery without angina pectoris: Secondary | ICD-10-CM | POA: Diagnosis not present

## 2014-09-27 DIAGNOSIS — Z6825 Body mass index (BMI) 25.0-25.9, adult: Secondary | ICD-10-CM | POA: Diagnosis not present

## 2014-09-27 DIAGNOSIS — F329 Major depressive disorder, single episode, unspecified: Secondary | ICD-10-CM | POA: Diagnosis not present

## 2014-09-27 DIAGNOSIS — K219 Gastro-esophageal reflux disease without esophagitis: Secondary | ICD-10-CM | POA: Diagnosis not present

## 2014-09-27 DIAGNOSIS — I700 Atherosclerosis of aorta: Secondary | ICD-10-CM | POA: Diagnosis not present

## 2014-10-12 ENCOUNTER — Ambulatory Visit: Payer: Medicare Other | Admitting: Cardiovascular Disease

## 2014-10-24 DIAGNOSIS — H4010X0 Unspecified open-angle glaucoma, stage unspecified: Secondary | ICD-10-CM | POA: Diagnosis not present

## 2014-10-30 DIAGNOSIS — H4011X3 Primary open-angle glaucoma, severe stage: Secondary | ICD-10-CM | POA: Diagnosis not present

## 2014-12-03 NOTE — H&P (Signed)
PATIENT NAME:  Angela Barrett, MARTORANA MR#:  [id number] DATE OF BIRTH:  February 16, 1941  DATE OF ADMISSION:  08/21/2014  PRIMARY CARDIOLOGIST: Dr. Fletcher Anon.    CHIEF COMPLAINT: Chest pain.    HISTORY OF PRESENT ILLNESS:  A 74 year old Caucasian female patient with history of CAD status post CABG in 2014 and PCI to RCA in October 2014 along with history of hypertension, diabetes, presented to the hospital complaining of chest pain. The patient had acute onset of chest pain which woke her up from sleep around 3:00 a.m. which did not resolve. She took 2 nitroglycerin pills with which her pain completely resolved. The patient also had similar complaint on Friday although a little milder, which responded to nitroglycerin. The patient mentions that she has been on Ranexa since her last PCI, since then she has not had any chest pain until Friday.   This was associated with some sweating but no nausea, shortness of breath, radiation.    PAST MEDICAL HISTORY:  1. History of CAD status post CABG in 2014 and later with a PCI to mid RCA.  2. Diabetes.  3. Hypertension.  4. Hyperlipidemia.  5. Hypothyroidism.  6. GERD.   FAMILY HISTORY: Mother had cancer. Father had COPD.    SOCIAL HISTORY: The patient does not smoke. No alcohol. No illicit drugs. Lives at home, is independent with activities of daily living.   ALLERGIES: AMITRIPTYLINE, CORTICOSTEROID, MACROBID, MOTRIN.    REVIEW OF SYSTEMS:    CONSTITUTIONAL: No fever, fatigue.  EYES: No blurred vision, pain, redness.  EARS, NOSE, AND THROAT: No tinnitus, ear pain, hearing loss.  RESPIRATORY: No cough, wheeze, hemoptysis.  CARDIOVASCULAR: Had chest pain.  GASTROINTESTINAL: No nausea, vomiting, diarrhea, abdominal pain.  GENITOURINARY: No dysuria, hematuria, frequency.  ENDOCRINE: No polyuria, nocturia, thyroid problems.  HEMATOLOGIC AND LYMPHATIC: No anemia, easy bruising, bleeding.  INTEGUMENTARY: No acne, rash, lesion.  MUSCULOSKELETAL: No back pain or  arthritis.   NEUROLOGIC:  No focal numbness, weakness.  PSYCHIATRIC: No anxiety or depression.   HOME MEDICATIONS:  1. Aspirin 81 mg daily.  2. Plavix 75 mg daily.  3. Ativan 1 mg 2 tablets once a day at bedtime.  4. Crestor 5 mg daily at bedtime.  5. Lantus 35 units subcutaneous once a day in the evening.  6. Lisinopril 5 mg daily.  7. Metformin 500 mg 2 times a day.  8. Metoprolol tartrate 25 mg 2 times a day.   9. Ranexa 1000 mg oral 2 times a day.  10. Synthroid 88 mcg daily.  11. Tylenol Arthritis 650 mg 2 tablets once a day.   PHYSICAL EXAMINATION:  VITAL SIGNS: Temperature 98.2, pulse of 61, respirations 17, blood pressure 136/79, saturating 97% on room air.  GENERAL: Moderately built Caucasian female patient sitting up in bed, seems comfortable.  PSYCHIATRIC: Alert and oriented x 3. Mood and affect appropriate. Judgment intact.  HEENT: Atraumatic, normocephalic.  Oral mucosa moist and pink. External ears and nose normal. No pallor. No icterus. Pupils bilaterally equal and reactive to light.  NECK: Supple. No thyromegaly. No palpable lymph nodes. Trachea midline. No carotid bruits or JVD.  CARDIOVASCULAR: S1, S2 without any murmurs. Peripheral pulses 2 +. No edema.  RESPIRATORY: Normal work of breathing. Clear to auscultation on both sides.  GASTROINTESTINAL: Soft abdomen, nontender. Bowel sounds present. No organomegaly palpable.  SKIN: Warm and dry. No petechiae, rash, ulcers.  MUSCULOSKELETAL: No joint swelling, redness, effusion of the large joints. Normal muscle tone.  NEUROLOGIC:  Motor strength 5/5 in  upper extremities. Sensation to fine touch intact all over.  LYMPHATIC: No cervical lymphadenopathy.   LABORATORY STUDIES: Glucose 136, BUN 17, creatinine 1.02, sodium 141, potassium 4.5, chloride 109. Troponin less than 0.02. AST, ALT, alkaline phosphatase, bilirubin normal.   WBC 6.3, hemoglobin 11.7, platelets 257,000.   EKG shows normal sinus rhythm, nothing acute.    Chest x-ray shows CABG changes, left-sided subsegmental atelectasis. No edema or infiltrate.   ASSESSMENT AND PLAN:  1.  Two episodes of chest pain responding to nitroglycerin, raises concern for unstable angina. At this point the patient will get 2 more sets of cardiac enzymes, EKG has nothing acute. Now the chest pain has resolved. We will admit the patient under observation onto the telemetry floor. Consult cardiology to see if she can get a cardiac catheterization. Further management as per her troponin trend and cardiology recommendations. Continue the patient's aspirin, Plavix, statin, and beta blocker.  2.  Mild sinus bradycardia secondary to beta blocker, this is asymptomatic, can be continued.  3.  Hypertension. Continue on medication.  4.  Diabetes mellitus. We will continue her home Lantus at a lower dose of 30 U instead of 35U and put her on sliding scale insulin.  5.  Deep vein thrombosis prophylaxis with Lovenox.   CODE STATUS: Full code.   TIME SPENT TODAY ON THIS CASE: 45 minutes.     ____________________________ Leia Alf Josealfredo Adkins, MD srs:bu D: 08/21/2014 18:01:12 ET T: 08/21/2014 18:39:08 ET JOB#: [id number]  cc: Alveta Heimlich R. Stuart Mirabile, MD, <Dictator> Muhammad A. Fletcher Anon, MD Neita Carp MD ELECTRONICALLY SIGNED 08/30/2014 16:53

## 2014-12-03 NOTE — Discharge Summary (Signed)
PATIENT NAME:  Angela Barrett, Angela Barrett MR#:  [id number] DATE OF BIRTH:  Jul 04, 1941  DATE OF ADMISSION:  08/21/2014 DATE OF DISCHARGE:  08/22/2014  ADMITTING PHYSICIAN: Srikar R. Sudini, MD    DISCHARGING PHYSICIAN: Gladstone Lighter, MD   PRIMARY CARE PHYSICIAN: Nonlocal.   PRIMARY CARDIOLOGIST: Muhammad A. Fletcher Anon, [place]: Cardiology consultation by Dr. Ida Rogue.   DISCHARGE DIAGNOSES: 1. Unstable angina.  2. History of coronary artery disease, status post bypass graft surgery in 2014 and percutaneous intervention to the mid right coronary artery.  3. Insulin-dependent diabetes mellitus.  4. Hypertension.  5. Hyperlipidemia.  6. Gastroesophageal reflux.  7. Hypothyroidism.   DISCHARGE HOME MEDICATIONS:  1. Metformin 500 mg p.o. daily.  2. Lantus 35 units subcutaneously in the evening.  3. Ativan 1 mg 2 tablets once a day at bedtime.  4. Aspirin 81 mg p.o. daily.  5. Lisinopril 5 mg p.o. daily  6. Metoprolol 25 mg  p.o. b.i.d.  7. Synthroid 88 mcg p.o. daily.  8. Ranexa 1000 mg p.o. b.i.d.  9. Plavix 75 mg p.o. daily.  10. Tylenol Arthritis 650 mg 2 tablets once a day at bedtime.  11. Crestor 5 mg p.o. at bedtime.  12. Sublingual nitroglycerin p.r.n. for chest pain.   DISCHARGE DIET: Low-sodium and ADA 1800-calorie diet.   DISCHARGE ACTIVITY: As tolerated.    FOLLOWUP INSTRUCTIONS: 1. Cardiology follow-up in 2 weeks.  2. PCP follow-up in 1-2 weeks.   LABORATORY DATA AND IMAGING STUDIES PRIOR TO DISCHARGE: Troponins x 3 negative. Sodium 141, potassium 4.5, chloride 109, bicarbonate 25, BUN 17, creatinine 1.02 glucose 136, and calcium of 8.2.  ALT 23, AST 23, alkaline phosphatase 49, total bilirubin 0.5, and albumin of 3.8. WBC 6.3, hemoglobin 11.7, hematocrit 34.8, platelet count is 257.   Chest x-ray showing CABG changes. Heart size within normal limits, minimally tortuous aorta, left basal atelectasis, no consolidation or pulmonary edema noted.  Myoview scan showing overall study imaging quality seems to be good. LV global function is normal, has normal appearance, no evidence of any ischemia. EF is 69%. No wall motion abnormality noted. Low risk scan overall.   BRIEF HOSPITAL COURSE: [name] is a 74 year old, very pleasant Caucasian female with past medical history significant for coronary artery disease, status post bypass surgery in 2014, followed by stent placement in right coronary artery, hypertension, diabetes, who presents to the hospital secondary to chest pain.  1. Chest pain. Could be angina. She is already on Ranexa and uses nitroglycerin p.r.n. She has not had any exertional chest pain recently. Could be muscular pain versus angina, as the pain started after a cough. Currently, chest pain-free. Had Myoview done, which is completely negative. Already on aspirin, Plavix, statin, metoprolol, lisinopril and cardiac medications. Continue Ranexa and sublingual nitroglycerin p.r.n. and follow up with cardiology as an outpatient.  2. Coronary artery disease, status post bypass graft surgery, stable as mentioned above. Continue cardiac medications.  3. Diabetes mellitus. Metformin was held in the hospital in case she needed a cardiac catheterization; how, sugars are stable. She can continue her Lantus, metformin at the time of discharge.  4. All her other home medications are being continued without any changes. Her course has been otherwise uneventful in the hospital.   DISCHARGE CONDITION: Stable.   DISCHARGE DISPOSITION: Home.   TIME SPENT ON DISCHARGE: 40 minutes.   ____________________________ Gladstone Lighter, MD rk:[id number] D: 08/22/2014 13:58:20 ET T: 08/22/2014 16:35:04 ET JOB#: [id number]  cc: Gladstone Lighter, MD, <  Dictator> Muhammad A. Fletcher Anon, MD Gladstone Lighter MD ELECTRONICALLY SIGNED 08/24/2014 10:59

## 2014-12-03 NOTE — Consult Note (Signed)
General Aspect Primary Cardiologist: Dr. Kirke Corin, MD _________________  74 year old female with history of CAD s/p 4 vessel CABG 01/2013 s/p PCI to [facility] 05/2013, DM2, HTN, HLD, hypothyroidisim, and GERD who presented to [facility] this morning with onset of intermittent chest pain that developed initially on 08/18/14 after a cough, but resolved, then returned over night on 1/17.  _________________  PMH: 1. CAD s/p 4 vessel CABG 01/2013 s/p PCI to [facility] 05/2013  2. DM2 3. HTN 4. HLD 5. Hypothyroidism 6. GERD _________________   Present Illness 74 year old female with the above problem list who presented to [facility] this morning with onset of intermittent chest pain that developed initially on 08/18/14 after a cough, but resolved, then returned over night on 1/17. Patient presented 01/2013 with non-ST elevation myocardial infarction. She underwent cardiac catheterization which showed significant three-vessel coronary artery disease with an ejection fraction of 45-50%. She underwent CABG by Dr. Dorris Fetch without complications.?? She had recurrent angina post CABG.??In 05/2013 she underwent repeat cardiac catheterization which showed significant 3 vessel CAD with diffuse diabetic vessels. SVG to RPDA/OM3 was found to be occluded. OM3 got collaterals from RCA. EF was low normal. She underwent successful PCI and DES placement to proximal and mid right coronary artery. Left circumflex occlusion was left to be treated medically. She continued to have significant angina even after PCI and was started on Ranexa with subsequent improvement in symptoms. She is moderately active at home having to help take care of her husband who has dementia at baseline. Part of her activities with this include lifting and moving. At times she does note some chest pains and SOB. She walks to the mail box daily. She also notes some chest pains at times with this. She also notes some bilateral leg soreness with this as well. She notes "not  feeling well" for the past couple of weeks.   Last Friday evening, 08/18/14 she was sitting on the sofa and coughed. This led to a sharp left sided chest pain that radiated through her breast, lasting a few minutes. She took a SL NTG with good results. All day on 1/16 and 1/17 she was asymptomatic. Around 3 AM she woke up to sub sternal chest pain that was similar to the pain that led to her CABG. She took 2 SL NTG that ultimately resolved her chest pain. She has been chest pain free since. Some associated palpitations. No associated diaphoresis, SOB, nausea, vomiting, presyncope, or syncope. She called EMS who advised that she come to [facility] for further evaluation. Upon her arrival at [facility] her initial troponin was found to be <0.02. EKG NSR, 60, no st/t changes. She is currently resting comfortably in her room and is without chest pain.   Physical Exam:  GEN no acute distress   HEENT hearing intact to voice   NECK supple   RESP normal resp effort  clear BS   CARD Regular rate and rhythm  No murmur   ABD denies tenderness  soft   EXTR negative edema   SKIN normal to palpation   NEURO cranial nerves intact   PSYCH alert, A+O to time, place, person, good insight   Review of Systems:  General: Fatigue   Skin: No Complaints   ENT: No Complaints   Eyes: No Complaints   Neck: No Complaints   Respiratory: Short of breath   Cardiovascular: Chest pain or discomfort  Tightness  Palpitations   Gastrointestinal: No Complaints   Genitourinary: No Complaints  Vascular: Calf pain with walking   Musculoskeletal: No Complaints   Neurologic: No Complaints   Hematologic: No Complaints   Endocrine: No Complaints   Psychiatric: No Complaints   Review of Systems: All other systems were reviewed and found to be negative   Medications/Allergies Reviewed Medications/Allergies reviewed   Family & Social History:  Family and Social History:  Family History mother cancer; father:  COPD   Social History negative tobacco, negative ETOH, negative Illicit drugs   Place of Living Home  lives with her husband     Quadruple Bypass:    Cardiac Stents:    Dry Eye:    Glaucoma:    GERD:    Arthritis:    IBS:    DM:   Home Medications: Medication Instructions Status  metFORMIN extended release 500 mg oral tablet, extended release 1 tab(s) orally once a day (in the evening), As Needed Active  Lantus 100 units/mL subcutaneous solution 35 unit(s) subcutaneous once a day (in the evening), As Needed if sugar is over 200 Active  Ativan 1 mg oral tablet 2 tab(s) orally once a day (at bedtime) Active  aspirin 81 mg oral tablet 1 tab(s) orally once a day Active  lisinopril 5 mg oral tablet 1 tab(s) orally once a day Active  Metoprolol Tartrate 25 mg oral tablet 1 tab(s) orally 2 times a day Active  Synthroid 88 mcg (0.088 mg) oral tablet 1 tab(s) orally once a day Active  Ranexa 1000 mg oral tablet, extended release 1 tab(s) orally 2 times a day Active  Plavix 75 mg oral tablet 1 tab(s) orally once a day Active  Tylenol Arthritis Extended Release 650 mg oral tablet, extended release 2 tab(s) orally once a day (at bedtime) Active   Lab Results:  Hepatic:  18-Jan-16 11:42   Bilirubin, Total 0.5  Alkaline Phosphatase 49 (46-116 NOTE: New Reference Range 02/21/14)  SGPT (ALT) 23 (14-63 NOTE: New Reference Range 02/21/14)  SGOT (AST) 23  Total Protein, Serum 7.1  Albumin, Serum 3.8  Routine Chem:  18-Jan-16 11:42   Glucose, Serum  136  BUN 17  Creatinine (comp) 1.02  Sodium, Serum 141  Potassium, Serum 4.5  Chloride, Serum  109  CO2, Serum 25  Calcium (Total), Serum  8.2  Osmolality (calc) 285  eGFR (African American) >60  eGFR (Non-African American)  56 (eGFR values <6mL/min/1.73 m2 may be an indication of chronic kidney disease (CKD). Calculated eGFR, using the MRDR Study equation, is useful in  patients with stable renal function. The eGFR  calculation will not be reliable in acutely ill patients when serum creatinine is changing rapidly. It is not useful in patients on dialysis. The eGFR calculation may not be applicable to patients at the low and high extremes of body sizes, pregnant women, and vegetarians.)  Anion Gap 7  Cardiac:  18-Jan-16 11:42   Troponin I < 0.02 (0.00-0.05 0.05 ng/mL or less: NEGATIVE  Repeat testing in 3-6 hrs  if clinically indicated. >0.05 ng/mL: POTENTIAL  MYOCARDIAL INJURY. Repeat  testing in 3-6 hrs if  clinically indicated. NOTE: An increase or decrease  of 30% or more on serial  testing suggests a  clinically important change)  Routine Hem:  18-Jan-16 11:42   WBC (CBC) 6.3  RBC (CBC)  3.54  Hemoglobin (CBC)  11.7  Hematocrit (CBC)  34.8  Platelet Count (CBC) 257 (Result(s) reported on 21 Aug 2014 at 12:06PM.)  MCV 99  MCH 33.2  MCHC 33.7  RDW 12.0  EKG:  EKG Interp. by me   Interpretation NSR, 60, no st/t changes   Radiology Results: XRay:    18-Jan-16 11:42, Chest Portable Single View  Chest Portable Single View   REASON FOR EXAM:    Chest pain  COMMENTS:       PROCEDURE: DXR - DXR PORTABLE CHEST SINGLE VIEW  - Aug 21 2014 11:42AM     CLINICAL DATA:  74 year old female with chest pain since early this  morning. Initial encounter.    EXAM:  PORTABLE CHEST - 1 VIEW    COMPARISON:  None.    FINDINGS:  Post CABG.  Heart size within normal limits.  Calcified minimally tortuous aorta.    Left base subsegmental atelectasis.    No segmental consolidation, pulmonary edema or pneumothorax.     IMPRESSION:  Post CABG.  Heart size within normal limits.    Calcified minimally tortuous aorta.    Left base subsegmental atelectasis. No segmental consolidation or  pulmonary edema    Electronically Signed    By: Chauncey Cruel M.D.    On: 08/21/2014 12:20         VerifiedBy: Doug Sou, M.D.,    Amitriptyline: Alt Ment Status  Macrobid: Swelling  Motrin  PM: Other  Corticosteroids: Unknown  Vital Signs/Nurse's Notes: **Vital Signs.:   18-Jan-16 15:00  Vital Signs Type Admission  Temperature Temperature (F) 97.8  Celsius 36.5  Temperature Source oral  Pulse Pulse 58  Respirations Respirations 20  Systolic BP Systolic BP 767  Diastolic BP (mmHg) Diastolic BP (mmHg) 73  Mean BP 108  Pulse Ox % Pulse Ox % 97  Pulse Ox Activity Level  At rest  Oxygen Delivery Room Air/ 21 %    Impression 74 year old female with history of CAD s/p 4 vessel CABG 01/2013 s/p PCI to [facility] 05/2013, DM2, HTN, HLD, hypothyroidisim, and GERD who presented to [facility] this morning with onset of intermittent chest pain that developed initially on 08/18/14 after a cough, but resolved, then returned over night on 1/17.   1. Unstable angina/CAD s/p 4 vessel CABG 01/2013 s/p PCI mRCA 05/2013: -Continue to cycle troponin (initial set <0.02). If remaining 2 are negative plan for [facility] 08/22/2014. If troponin trends upwards plan to start heparin gtt and will need cardiac cath.  -Hold metformin while admitted -SSI -Continue Aspirin 81 mg  -Continue Plavix 75 mg   2. HTN: -Currently uncontrolled -Has not received Lopressor 25 mg (will have this given early) -If BP remains elevated could add hydralazine   3. HLD: -Continue Crestor 5 mg   Electronic Signatures for Addendum Section:  Kathlyn Sacramento (MD) (Signed Addendum 18-Jan-16 16:19)  The patient was seen and examined. Agree with the above. She is well known to me with previous CABG and PCI. She presented with a 30 min episode of chest pain this am. responded to 2 NTG. she was anxious. No murmurs by exam. ECG without acute changes. TnI is normal.  Continue serial cardiac enzymes. If negative, then recommend Lexiscan Myoview in am.   Electronic Signatures: Kathlyn Sacramento (MD)  (Signed 18-Jan-16 16:19)  Co-Signer: General Aspect/Present Illness, Home Medications, Allergies Idolina Primer, Ryan M (PA-C)  (Signed 18-Jan-16  16:10)  Authored: General Aspect/Present Illness, History and Physical Exam, Review of System, Family & Social History, Past Medical History, Home Medications, Labs, EKG , Radiology, Allergies, Vital Signs/Nurse's Notes, Impression/Plan   Last Updated: 18-Jan-16 16:19 by Kathlyn Sacramento (MD)

## 2014-12-31 ENCOUNTER — Other Ambulatory Visit: Payer: Self-pay | Admitting: Cardiovascular Disease

## 2015-01-06 ENCOUNTER — Other Ambulatory Visit: Payer: Self-pay | Admitting: Cardiovascular Disease

## 2015-03-12 ENCOUNTER — Ambulatory Visit (INDEPENDENT_AMBULATORY_CARE_PROVIDER_SITE_OTHER): Payer: Medicare Other | Admitting: Cardiovascular Disease

## 2015-03-12 ENCOUNTER — Encounter: Payer: Self-pay | Admitting: Cardiovascular Disease

## 2015-03-12 VITALS — BP 120/60 | HR 55 | Ht 63.5 in | Wt 144.0 lb

## 2015-03-12 DIAGNOSIS — I25118 Atherosclerotic heart disease of native coronary artery with other forms of angina pectoris: Secondary | ICD-10-CM | POA: Diagnosis not present

## 2015-03-12 DIAGNOSIS — R000 Tachycardia, unspecified: Secondary | ICD-10-CM

## 2015-03-12 DIAGNOSIS — R0602 Shortness of breath: Secondary | ICD-10-CM

## 2015-03-12 DIAGNOSIS — M79604 Pain in right leg: Secondary | ICD-10-CM | POA: Insufficient documentation

## 2015-03-12 DIAGNOSIS — R002 Palpitations: Secondary | ICD-10-CM | POA: Diagnosis not present

## 2015-03-12 DIAGNOSIS — M79606 Pain in leg, unspecified: Secondary | ICD-10-CM

## 2015-03-12 DIAGNOSIS — M79605 Pain in left leg: Secondary | ICD-10-CM

## 2015-03-12 DIAGNOSIS — R600 Localized edema: Secondary | ICD-10-CM

## 2015-03-12 DIAGNOSIS — I10 Essential (primary) hypertension: Secondary | ICD-10-CM

## 2015-03-12 NOTE — Assessment & Plan Note (Signed)
Blood pressure is controlled on current medications. 

## 2015-03-12 NOTE — Progress Notes (Signed)
HPI  This is a 74 year old female who is here today for a followup regarding coronary artery disease status post CABG and PCI.  She has known history of diabetes and hypertension. She presented in June, 2014 with non-ST elevation myocardial infarction. She underwent cardiac catheterization which showed significant three-vessel coronary artery disease with an ejection fraction of 45-50%. She underwent CABG by Dr. Roxan Hockey without complications.  She had recurrent angina post CABG.  I proceeded with cardiac catheterization which showed significant 3 vessel CAD with diffuse diabetic vessels. SVG to RPDA/OM3 was found to be occluded. OM3 got collaterals from RCA. EF was low normal. I performed Successful PCI and DES placement to proximal and mid right coronary artery. Left circumflex occlusion was left to be treated medically. She continued to have significant angina even after PCI and was started on Ranexa with subsequent improvement in symptoms. She did not tolerate statins in the past due to severe myalgia but was ultimately able to tolerate small dose Crestor. She was hospitalized in January 2016 for chest pain. She ruled out for myocardial infarction. Nuclear stress test was normal.   She denies chest pain. However, she is complaining of increased fatigue and having no energy with shortness of breath with activities. She also noted right leg swelling. She had vein harvest from the right leg before with previous swelling. We did venous duplex in the past which was negative for DVT. She is reporting almost daily palpitations and tachycardia especially at night. She is also complaining of bilateral feet pain with walking.  Allergies  Allergen Reactions  . Amitriptyline Other (See Comments)    disoriented  . Cortisone Other (See Comments)    Eyes hurt and sugar goes up  . Lipitor [Atorvastatin]     Fatigue and weakness and high and low doses   . Macrodantin [Nitrofurantoin] Itching and  Swelling  . Motrin [Ibuprofen] Other (See Comments)    Fluid retention     Current Outpatient Prescriptions on File Prior to Visit  Medication Sig Dispense Refill  . acetaminophen (TYLENOL ARTHRITIS PAIN) 650 MG CR tablet Take 1,300 mg by mouth as needed.     . Artificial Tear Ointment (ARTIFICIAL TEARS) ointment Place 1 application into both eyes at bedtime.    Marland Kitchen aspirin 81 MG tablet Take 1 tablet (81 mg total) by mouth daily.    . clopidogrel (PLAVIX) 75 MG tablet TAKE 1 TABLET (75 MG TOTAL) BY MOUTH DAILY WITH BREAKFAST. 90 tablet 4  . CRESTOR 5 MG tablet TAKE 1 TABLET (5 MG TOTAL) BY MOUTH DAILY. 90 tablet 3  . insulin glargine (LANTUS) 100 UNIT/ML injection Inject 35 Units into the skin at bedtime as needed (if sugar is higher than 200). Only if sugar is over 200.    . ketotifen (THERA TEARS ALLERGY) 0.025 % ophthalmic solution Place 1 drop into both eyes as needed (for allergy eyes.).     Marland Kitchen latanoprost (XALATAN) 0.005 % ophthalmic solution Place 1 drop into both eyes at bedtime.     Marland Kitchen levothyroxine (SYNTHROID, LEVOTHROID) 88 MCG tablet Take 88 mcg by mouth daily before breakfast.    . lisinopril (PRINIVIL,ZESTRIL) 5 MG tablet TAKE 1 TABLET BY MOUTH DAILY 90 tablet 3  . LORazepam (ATIVAN) 1 MG tablet Take 2.5 mg by mouth at bedtime.    . metFORMIN (GLUCOPHAGE-XR) 500 MG 24 hr tablet Take 1 tablet (500 mg total) by mouth daily with breakfast. (Patient taking differently: Take 500 mg by mouth daily. )    .  metoprolol tartrate (LOPRESSOR) 25 MG tablet TAKE 1 TABLET BY MOUTH TWICE A DAY 180 tablet 3  . nitroGLYCERIN (NITROSTAT) 0.4 MG SL tablet Place 1 tablet (0.4 mg total) under the tongue every 5 (five) minutes as needed for chest pain. 25 tablet 3  . pantoprazole (PROTONIX) 40 MG tablet Take 40 mg by mouth as needed.     Marland Kitchen RANEXA 1000 MG SR tablet TAKE 1 TABLET (1,000 MG TOTAL) BY MOUTH 2 (TWO) TIMES DAILY. 180 tablet 3   No current facility-administered medications on file prior to  visit.     Past Medical History  Diagnosis Date  . Depression   . Hypothyroidism   . GERD (gastroesophageal reflux disease)   . IBS (irritable bowel syndrome)   . Lymphocytosis 03/24/2012  . PONV (postoperative nausea and vomiting)   . Hyperlipidemia   . Coronary artery disease     a. nl myoview ~ 2006;  b. 01/2013 s/p MI and CABG x 4 (VG->D2, VG->RPDA->OM3, LIMA->LAD;  c. 05/2013 Cath/PCI: LM nl, LAD 60-70p/m, D1 70p, LCX 133m, OM2 60p, RCA 50-60p/51m (3.0x33 Xience DES), VG->D2 60, VG->RPDA/OM3 100, LIMA->LAD nl w 70 dLAD, EF 50%.  . Hypertension   . History of pneumonia 1985  . Diabetes mellitus, type 2     "dx'd 1992" (05/25/2013)  . Anemia     "after OHS in 01/2013" (05/25/2013)  . NWGNFAOZ(308.6)     "monthly" (05/25/2013)  . OA (osteoarthritis)   . Arthritis     "joints" (05/25/2013)  . Anxiety   . Glaucoma     bilateral     Past Surgical History  Procedure Laterality Date  . Thyroidectomy  2010  . Coronary artery bypass graft N/A 01/07/2013    Procedure: CORONARY ARTERY BYPASS GRAFTING (CABG);  Surgeon: Melrose Nakayama, MD;  Location: [place];  Service: Open Heart Surgery;  Laterality: N/A;  . Cardiac catheterization  01/2013  . Carpal tunnel release Bilateral 1990-1992  . Knee arthroscopy Bilateral 1993-1996  . Hammer toe surgery Right 2009  . Bladder suspension  2005  . Vaginal hysterectomy  1974  . Coronary angioplasty with stent placement  05/25/2013    "1" (05/25/2013)  . Left heart catheterization with coronary angiogram N/A 01/05/2013    Procedure: LEFT HEART CATHETERIZATION WITH CORONARY ANGIOGRAM;  Surgeon: Wellington Hampshire, MD;  Location: [place] CATH LAB;  Service: Cardiovascular;  Laterality: N/A;  . Left heart catheterization with coronary/graft angiogram N/A 05/25/2013    Procedure: LEFT HEART CATHETERIZATION WITH Beatrix Fetters;  Surgeon: Wellington Hampshire, MD;  Location: [place] CATH LAB;  Service: Cardiovascular;  Laterality: N/A;  . Percutaneous  coronary stent intervention (pci-s)  05/25/2013    Procedure: PERCUTANEOUS CORONARY STENT INTERVENTION (PCI-S);  Surgeon: Wellington Hampshire, MD;  Location: [facility] CATH LAB;  Service: Cardiovascular;;     Family History  Problem Relation Age of Onset  . Cancer Mother     bone CA, died @ 2  . Emphysema Father     died @ 74  . Emphysema Brother     died @ 82  . Diabetes Son     alive and well.     History   Social History  . Marital Status: Married    Spouse Name: N/A  . Number of Children: N/A  . Years of Education: N/A   Occupational History  . Not on file.   Social History Main Topics  . Smoking status: Never Smoker   . Smokeless tobacco: Never Used  .  Alcohol Use: No  . Drug Use: No  . Sexual Activity: Not Currently   Other Topics Concern  . Not on file   Social History Narrative   Lives in [place] with husband.     PHYSICAL EXAM   BP 120/60 mmHg  Pulse 55  Ht 5' 3.5" (1.613 m)  Wt 144 lb (65.318 kg)  BMI 25.11 kg/m2 Constitutional: She is oriented to person, place, and time. She appears well-developed and well-nourished. No distress.  HENT: No nasal discharge.  Head: Normocephalic and atraumatic.  Eyes: Pupils are equal and round. Right eye exhibits no discharge. Left eye exhibits no discharge.  Neck: Normal range of motion. Neck supple. No JVD present. No thyromegaly present.  Cardiovascular: Normal rate, regular rhythm, normal heart sounds. Exam reveals no gallop and no friction rub. No murmur heard.  Pulmonary/Chest: Effort normal and decreased breath sounds at the left base. No stridor. No respiratory distress. She has no wheezes. She has no rales. She exhibits no tenderness.  Abdominal: Soft. Bowel sounds are normal. She exhibits no distension. There is no tenderness. There is no rebound and no guarding.  Musculoskeletal: Normal range of motion. She exhibits no edema and no tenderness.  Neurological: She is alert and oriented to person, place, and  time. Coordination normal.  Skin: Skin is warm and dry. No rash noted. She is not diaphoretic. No erythema. No pallor.  Psychiatric: She has a normal mood and affect. Her behavior is normal. Judgment and thought content normal.    EKG:  Sinus  Bradycardia  WITHIN NORMAL LIMITS   ASSESSMENT AND PLAN

## 2015-03-12 NOTE — Assessment & Plan Note (Signed)
The patient is complaining of increased shortness of breath and fatigue with no significant chest pain. I requested an echocardiogram to evaluate LV systolic function. Her vital signs are stable and physical exam does not show signs of congestive heart failure.

## 2015-03-12 NOTE — Assessment & Plan Note (Signed)
She is complaining of bilateral exertional pain which could be due to diabetic neuropathy. Distal pulses are palpable but diminished. Given that she is diabetic, I requested lower extremity arterial Doppler to evaluate for possible PAD.

## 2015-03-12 NOTE — Assessment & Plan Note (Signed)
She is complaining of frequent palpitations especially at night. I requested a 48-hour Holter monitor. EKG today shows sinus bradycardia.

## 2015-03-12 NOTE — Patient Instructions (Addendum)
Medication Instructions:  Your physician recommends that you continue on your current medications as directed. Please refer to the Current Medication list given to you today.   Labwork: none  Testing/Procedures: Your physician has requested that you have an echocardiogram. Echocardiography is a painless test that uses sound waves to create images of your heart. It provides your doctor with information about the size and shape of your heart and how well your heart's chambers and valves are working. This procedure takes approximately one hour. There are no restrictions for this procedure.    Your physician has requested that you have a lower or upper extremity arterial duplex. This test is an ultrasound of the arteries in the legs or arms. It looks at arterial blood flow in the legs and arms. Allow one hour for Lower and Upper Arterial scans. There are no restrictions or special instructions  Your physician has recommended that you wear a holter monitor. Holter monitors are medical devices that record the heart's electrical activity. Doctors most often use these monitors to diagnose arrhythmias. Arrhythmias are problems with the speed or rhythm of the heartbeat. The monitor is a small, portable device. You can wear one while you do your normal daily activities. This is usually used to diagnose what is causing palpitations/syncope (passing out).    Follow-Up: Your physician wants you to follow-up in: six months with Dr. Fletcher Anon.  You will receive a reminder letter in the mail two months in advance. If you don't receive a letter, please call our office to schedule the follow-up appointment.   Any Other Special Instructions Will Be Listed Below (If Applicable).  Echocardiogram An echocardiogram, or echocardiography, uses sound waves (ultrasound) to produce an image of your heart. The echocardiogram is simple, painless, obtained within a short period of time, and offers valuable information to your  health care provider. The images from an echocardiogram can provide information such as:  Evidence of coronary artery disease (CAD).  Heart size.  Heart muscle function.  Heart valve function.  Aneurysm detection.  Evidence of a past heart attack.  Fluid buildup around the heart.  Heart muscle thickening.  Assess heart valve function. LET [facility] CARE PROVIDER KNOW ABOUT:  Any allergies you have.  All medicines you are taking, including vitamins, herbs, eye drops, creams, and over-the-counter medicines.  Previous problems you or members of your family have had with the use of anesthetics.  Any blood disorders you have.  Previous surgeries you have had.  Medical conditions you have.  Possibility of pregnancy, if this applies. BEFORE THE PROCEDURE  No special preparation is needed. Eat and drink normally.  PROCEDURE   In order to produce an image of your heart, gel will be applied to your chest and a wand-like tool (transducer) will be moved over your chest. The gel will help transmit the sound waves from the transducer. The sound waves will harmlessly bounce off your heart to allow the heart images to be captured in real-time motion. These images will then be recorded.  You may need an IV to receive a medicine that improves the quality of the pictures. AFTER THE PROCEDURE You may return to your normal schedule including diet, activities, and medicines, unless your health care provider tells you otherwise. Document Released: 07/18/2000 Document Revised: 12/05/2013 Document Reviewed: 03/28/2013 [facility] Patient Information 2015 [place], [place]. This information is not intended to replace advice given to you by your health care provider. Make sure you discuss any questions you have with your  health care provider. Cardiac Event Monitoring A cardiac event monitor is a small recording device used to help detect abnormal heart rhythms (arrhythmias). The monitor is used to  record heart rhythm when noticeable symptoms such as the following occur:  Fast heartbeats (palpitations), such as heart racing or fluttering.  Dizziness.  Fainting or light-headedness.  Unexplained weakness. The monitor is wired to two electrodes placed on your chest. Electrodes are flat, sticky disks that attach to your skin. The monitor can be worn for up to 30 days. You will wear the monitor at all times, except when bathing.  HOW TO USE YOUR CARDIAC EVENT MONITOR A technician will prepare your chest for the electrode placement. The technician will show you how to place the electrodes, how to work the monitor, and how to replace the batteries. Take time to practice using the monitor before you leave the office. Make sure you understand how to send the information from the monitor to your health care provider. This requires a telephone with a landline, not a cell phone. You need to:  Wear your monitor at all times, except when you are in water:  Do not get the monitor wet.  Take the monitor off when bathing. Do not swim or use a hot tub with it on.  Keep your skin clean. Do not put body lotion or moisturizer on your chest.  Change the electrodes daily or any time they stop sticking to your skin. You might need to use tape to keep them on.  It is possible that your skin under the electrodes could become irritated. To keep this from happening, try to put the electrodes in slightly different places on your chest. However, they must remain in the area under your left breast and in the upper right section of your chest.  Make sure the monitor is safely clipped to your clothing or in a location close to your body that your health care provider recommends.  Press the button to record when you feel symptoms of heart trouble, such as dizziness, weakness, light-headedness, palpitations, thumping, shortness of breath, unexplained weakness, or a fluttering or racing heart. The monitor is always on  and records what happened slightly before you pressed the button, so do not worry about being too late to get good information.  Keep a diary of your activities, such as walking, doing chores, and taking medicine. It is especially important to note what you were doing when you pushed the button to record your symptoms. This will help your health care provider determine what might be contributing to your symptoms. The information stored in your monitor will be reviewed by your health care provider alongside your diary entries.  Send the recorded information as recommended by your health care provider. It is important to understand that it will take some time for your health care provider to process the results.  Change the batteries as recommended by your health care provider. SEEK IMMEDIATE MEDICAL CARE IF:   You have chest pain.  You have extreme difficulty breathing or shortness of breath.  You develop a very fast heartbeat that persists.  You develop dizziness that does not go away.  You faint or constantly feel you are about to faint. Document Released: 04/29/2008 Document Revised: 12/05/2013 Document Reviewed: 01/17/2013 [facility] Patient Information 2015 [place], [place]. This information is not intended to replace advice given to you by your health care provider. Make sure you discuss any questions you have with your health care provider.

## 2015-03-12 NOTE — Assessment & Plan Note (Signed)
This is mainly on the right side where she had been harvested for CABG. Previous venous duplex showed no evidence of DVT. This is likely due to chronic venous insufficiency. She had hard time with support stockings in the past. I advised her to elevate her legs frequently during the day.

## 2015-03-13 ENCOUNTER — Ambulatory Visit (INDEPENDENT_AMBULATORY_CARE_PROVIDER_SITE_OTHER): Payer: Medicare Other

## 2015-03-13 ENCOUNTER — Other Ambulatory Visit: Payer: Self-pay

## 2015-03-13 DIAGNOSIS — R0602 Shortness of breath: Secondary | ICD-10-CM | POA: Diagnosis not present

## 2015-03-13 DIAGNOSIS — M79606 Pain in leg, unspecified: Secondary | ICD-10-CM

## 2015-03-15 ENCOUNTER — Ambulatory Visit
Admission: RE | Admit: 2015-03-15 | Discharge: 2015-03-15 | Disposition: A | Discharge status: Home / Self Care | Payer: Medicare Other | Source: Ambulatory Visit | Attending: Cardiovascular Disease | Admitting: Cardiovascular Disease

## 2015-03-15 DIAGNOSIS — R002 Palpitations: Secondary | ICD-10-CM | POA: Insufficient documentation

## 2015-03-21 ENCOUNTER — Other Ambulatory Visit: Payer: Self-pay | Admitting: Cardiovascular Disease

## 2015-03-21 DIAGNOSIS — I739 Peripheral vascular disease, unspecified: Secondary | ICD-10-CM

## 2015-03-26 DIAGNOSIS — Z1231 Encounter for screening mammogram for malignant neoplasm of breast: Secondary | ICD-10-CM | POA: Diagnosis not present

## 2015-03-30 DIAGNOSIS — R8290 Unspecified abnormal findings in urine: Secondary | ICD-10-CM | POA: Diagnosis not present

## 2015-03-30 DIAGNOSIS — E1151 Type 2 diabetes mellitus with diabetic peripheral angiopathy without gangrene: Secondary | ICD-10-CM | POA: Diagnosis not present

## 2015-03-30 DIAGNOSIS — N390 Urinary tract infection, site not specified: Secondary | ICD-10-CM | POA: Diagnosis not present

## 2015-03-30 DIAGNOSIS — E890 Postprocedural hypothyroidism: Secondary | ICD-10-CM | POA: Diagnosis not present

## 2015-04-06 DIAGNOSIS — M1990 Unspecified osteoarthritis, unspecified site: Secondary | ICD-10-CM | POA: Diagnosis not present

## 2015-04-06 DIAGNOSIS — Z0000 Encounter for general adult medical examination without abnormal findings: Secondary | ICD-10-CM | POA: Diagnosis not present

## 2015-04-06 DIAGNOSIS — E1129 Type 2 diabetes mellitus with other diabetic kidney complication: Secondary | ICD-10-CM | POA: Diagnosis not present

## 2015-04-06 DIAGNOSIS — E890 Postprocedural hypothyroidism: Secondary | ICD-10-CM | POA: Diagnosis not present

## 2015-04-06 DIAGNOSIS — K589 Irritable bowel syndrome without diarrhea: Secondary | ICD-10-CM | POA: Diagnosis not present

## 2015-04-06 DIAGNOSIS — N183 Chronic kidney disease, stage 3 (moderate): Secondary | ICD-10-CM | POA: Diagnosis not present

## 2015-04-06 DIAGNOSIS — E1151 Type 2 diabetes mellitus with diabetic peripheral angiopathy without gangrene: Secondary | ICD-10-CM | POA: Diagnosis not present

## 2015-04-06 DIAGNOSIS — I2510 Atherosclerotic heart disease of native coronary artery without angina pectoris: Secondary | ICD-10-CM | POA: Diagnosis not present

## 2015-04-06 DIAGNOSIS — K219 Gastro-esophageal reflux disease without esophagitis: Secondary | ICD-10-CM | POA: Diagnosis not present

## 2015-04-06 DIAGNOSIS — Z6826 Body mass index (BMI) 26.0-26.9, adult: Secondary | ICD-10-CM | POA: Diagnosis not present

## 2015-04-06 DIAGNOSIS — I129 Hypertensive chronic kidney disease with stage 1 through stage 4 chronic kidney disease, or unspecified chronic kidney disease: Secondary | ICD-10-CM | POA: Diagnosis not present

## 2015-04-06 DIAGNOSIS — Z1389 Encounter for screening for other disorder: Secondary | ICD-10-CM | POA: Diagnosis not present

## 2015-04-24 DIAGNOSIS — Z1212 Encounter for screening for malignant neoplasm of rectum: Secondary | ICD-10-CM | POA: Diagnosis not present

## 2015-05-01 ENCOUNTER — Encounter: Payer: Self-pay | Admitting: *Deleted

## 2015-05-01 ENCOUNTER — Emergency Department
Admission: EM | Admit: 2015-05-01 | Discharge: 2015-05-01 | Disposition: A | Discharge status: Home / Self Care | Payer: Medicare Other | Attending: Emergency Medicine | Admitting: Emergency Medicine

## 2015-05-01 DIAGNOSIS — X58XXXA Exposure to other specified factors, initial encounter: Secondary | ICD-10-CM | POA: Insufficient documentation

## 2015-05-01 DIAGNOSIS — Y9289 Other specified places as the place of occurrence of the external cause: Secondary | ICD-10-CM | POA: Insufficient documentation

## 2015-05-01 DIAGNOSIS — S00411A Abrasion of right ear, initial encounter: Secondary | ICD-10-CM | POA: Insufficient documentation

## 2015-05-01 DIAGNOSIS — Y9389 Activity, other specified: Secondary | ICD-10-CM | POA: Insufficient documentation

## 2015-05-01 DIAGNOSIS — Y998 Other external cause status: Secondary | ICD-10-CM | POA: Insufficient documentation

## 2015-05-01 DIAGNOSIS — Z794 Long term (current) use of insulin: Secondary | ICD-10-CM | POA: Insufficient documentation

## 2015-05-01 DIAGNOSIS — E119 Type 2 diabetes mellitus without complications: Secondary | ICD-10-CM | POA: Insufficient documentation

## 2015-05-01 DIAGNOSIS — Z7982 Long term (current) use of aspirin: Secondary | ICD-10-CM | POA: Insufficient documentation

## 2015-05-01 DIAGNOSIS — Z79899 Other long term (current) drug therapy: Secondary | ICD-10-CM | POA: Insufficient documentation

## 2015-05-01 DIAGNOSIS — I10 Essential (primary) hypertension: Secondary | ICD-10-CM | POA: Insufficient documentation

## 2015-05-01 DIAGNOSIS — H9221 Otorrhagia, right ear: Secondary | ICD-10-CM

## 2015-05-01 DIAGNOSIS — H9201 Otalgia, right ear: Secondary | ICD-10-CM | POA: Diagnosis present

## 2015-05-01 NOTE — ED Notes (Addendum)
Pt has bleeding from right ear.  Pt also has a right earache.  Recent sinus congestion. Pt alert.  Speech clear.   No bleeding now from ear.

## 2015-05-01 NOTE — ED Notes (Signed)
Pt reports bleeding from right ear beginning this evening while eating dinner. No trauma to the ear or difficulty hearing. Pt denies pain at this time. Blood seen in ear but ear is not actively bleeding at this time. Pt is taking blood thinners but reports one week ago levels were in therapeutic range. Pt reports over the past month she has been bruising easier and easier.

## 2015-05-01 NOTE — Discharge Instructions (Signed)
Draining Ear Fluid (drainage) can come from your ear. This may be wax, yellowish-Breeze fluid (pus), blood, or other fluids. An infection, injury, or irritation may cause fluid to drain from your ear.  HOME CARE  Only take medicine as told by your doctor. This may include ear drops.  Do not rub inside your ear with cotton-tipped swabs.  Do not swim until your doctor says it is okay.  Before you take a shower, cover a cotton ball with petroleum jelly. Put it in your ear. This will keep water out.  Stay away from smoke.  Make sure your shots (vaccinations) are up to date.  Wash your hands well.  Keep all doctor visits as told. GET HELP RIGHT AWAY IF:   You have very bad ear pain or a headache.  You have a fever.  The patient is older than 3 months with a rectal temperature of 102F (38.9C) or higher.  The patient is 56 months old or younger with a rectal temperature of 100.68F (38C) or higher.  You throw up (vomit).  You feel dizzy.  You have twitching or shaking (seizure).  You have new hearing loss.  You have more fluid coming from the ear.  You have pain, a fever, or fluid drainage that does not get better within 48 hours of taking medicine.  You are more tired than normal. MAKE SURE YOU:   Understand these instructions.  Will watch your condition.  Will get help right away if you are not doing well or get worse. Document Released: 01/08/2010 Document Revised: 12/05/2013 Document Reviewed: 01/08/2010 [facility] Patient Information 2015 [place], [place]. This information is not intended to replace advice given to you by your health care provider. Make sure you discuss any questions you have with your health care provider.   Your ear exam did not reveal an injury or infection to the ear drum.  You have a scratch to the canal with local bleeding. It should stop. Apply a Vaseline-soak gauze to the canal as needed.

## 2015-05-02 ENCOUNTER — Telehealth: Payer: Self-pay

## 2015-05-02 NOTE — Telephone Encounter (Signed)
S/w pt daughter who states pt felt dizzy yesterday afternoon. Ear bleeding for 1 1/2 hours, EMS contacted. Suggested ER. At ER, clots of blood removed from ear. Unable to determine cause of bleeding. Pt discharged w/suggestion to contact MD regarding use of plavix.  Daughter states pt has been having more bruising more w/insulin shots and generalized bruising.  Diarrhea x 2 day but denies blood in stool. Denies blood in urine.  Ear still bleeding slightly this morning but is improving. Takes plavix 75mg  and 81mg  ASA qd  Did not take plavix or aspirin this morning.  Forward to MD

## 2015-05-02 NOTE — Telephone Encounter (Signed)
S/w pt daughter, Hilda Blades, regarding recommendations. Daughter verbalized understanding and will call back in one week with an update.

## 2015-05-02 NOTE — ED Provider Notes (Signed)
[facility] Emergency Department Provider Note ____________________________________________  Time seen: 2125  I have reviewed the triage vital signs and the nursing notes.  HISTORY  Chief Complaint  Otalgia   HPI Angela Barrett is a 74 y.o. female reports to the ED for evaluation of bleeding noted from the right ear. She does spontaneously the right ear without known injury or trauma to the drum or canal. She was eating dinner when she began to note some fullness in the ear, as well as some decreased hearing. She reached up to touch the ear canal and noted blood on the tissue as well as some presumed earwax. She also notes that she doses blood thinners, and just last week she was reported to have therapeutic range. She denies any other injury at this time, and denies any active bleeding currently.  Past Medical History  Diagnosis Date  . Depression   . Hypothyroidism   . GERD (gastroesophageal reflux disease)   . IBS (irritable bowel syndrome)   . Lymphocytosis 03/24/2012  . PONV (postoperative nausea and vomiting)   . Hyperlipidemia   . Coronary artery disease     a. nl myoview ~ 2006;  b. 01/2013 s/p MI and CABG x 4 (VG->D2, VG->RPDA->OM3, LIMA->LAD;  c. 05/2013 Cath/PCI: LM nl, LAD 60-70p/m, D1 70p, LCX 135m, OM2 60p, RCA 50-60p/75m (3.0x33 Xience DES), VG->D2 60, VG->RPDA/OM3 100, LIMA->LAD nl w 70 dLAD, EF 50%.  . Hypertension   . History of pneumonia 1985  . Diabetes mellitus, type 2     "dx'd 1992" (05/25/2013)  . Anemia     "after OHS in 01/2013" (05/25/2013)  . ACZYSAYT(016.0)     "monthly" (05/25/2013)  . OA (osteoarthritis)   . Arthritis     "joints" (05/25/2013)  . Anxiety   . Glaucoma     bilateral    Patient Active Problem List   Diagnosis Date Noted  . Palpitations 03/12/2015  . Bilateral leg pain 03/12/2015  . Leg edema 04/14/2014  . Unstable angina 05/26/2013  . Hypothyroidism   . Diabetes mellitus, type 2   . Hypertension   .  Coronary artery disease   . GERD (gastroesophageal reflux disease)   . Hyperlipidemia   . NSTEMI (non-ST elevated myocardial infarction) 01/12/2013  . 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 Diabetes 01/12/2013  . Unspecified hypothyroidism 01/12/2013  . Lymphocytosis 03/24/2012    Past Surgical History  Procedure Laterality Date  . Thyroidectomy  2010  . Coronary artery bypass graft N/A 01/07/2013    Procedure: CORONARY ARTERY BYPASS GRAFTING (CABG);  Surgeon: Melrose Nakayama, MD;  Location: [place];  Service: Open Heart Surgery;  Laterality: N/A;  . Cardiac catheterization  01/2013  . Carpal tunnel release Bilateral 1990-1992  . Knee arthroscopy Bilateral 1993-1996  . Hammer toe surgery Right 2009  . Bladder suspension  2005  . Vaginal hysterectomy  1974  . Coronary angioplasty with stent placement  05/25/2013    "1" (05/25/2013)  . Left heart catheterization with coronary angiogram N/A 01/05/2013    Procedure: LEFT HEART CATHETERIZATION WITH CORONARY ANGIOGRAM;  Surgeon: Wellington Hampshire, MD;  Location: [place] CATH LAB;  Service: Cardiovascular;  Laterality: N/A;  . Left heart catheterization with coronary/graft angiogram N/A 05/25/2013    Procedure: LEFT HEART CATHETERIZATION WITH Beatrix Fetters;  Surgeon: Wellington Hampshire, MD;  Location: [place] CATH LAB;  Service: Cardiovascular;  Laterality: N/A;  . Percutaneous coronary stent intervention (pci-s)  05/25/2013    Procedure: PERCUTANEOUS CORONARY  STENT INTERVENTION (PCI-S);  Surgeon: Wellington Hampshire, MD;  Location: [facility] CATH LAB;  Service: Cardiovascular;;    Current Outpatient Rx  Name  Route  Sig  Dispense  Refill  . acetaminophen (TYLENOL ARTHRITIS PAIN) 650 MG CR tablet   Oral   Take 1,300 mg by mouth as needed.          . Artificial Tear Ointment (ARTIFICIAL TEARS) ointment   Both Eyes   Place 1 application into both eyes at bedtime.         Marland Kitchen aspirin 81 MG tablet   Oral   Take 1 tablet (81 mg total) by mouth daily.          . clopidogrel (PLAVIX) 75 MG tablet      TAKE 1 TABLET (75 MG TOTAL) BY MOUTH DAILY WITH BREAKFAST.   90 tablet   4   . CRESTOR 5 MG tablet      TAKE 1 TABLET (5 MG TOTAL) BY MOUTH DAILY.   90 tablet   3   . insulin glargine (LANTUS) 100 UNIT/ML injection   Subcutaneous   Inject 35 Units into the skin at bedtime as needed (if sugar is higher than 200). Only if sugar is over 200.         . ketotifen (THERA TEARS ALLERGY) 0.025 % ophthalmic solution   Both Eyes   Place 1 drop into both eyes as needed (for allergy eyes.).          Marland Kitchen latanoprost (XALATAN) 0.005 % ophthalmic solution   Both Eyes   Place 1 drop into both eyes at bedtime.          Marland Kitchen levothyroxine (SYNTHROID, LEVOTHROID) 88 MCG tablet   Oral   Take 88 mcg by mouth daily before breakfast.         . lisinopril (PRINIVIL,ZESTRIL) 5 MG tablet      TAKE 1 TABLET BY MOUTH DAILY   90 tablet   3   . LORazepam (ATIVAN) 1 MG tablet   Oral   Take 2.5 mg by mouth at bedtime.         . metFORMIN (GLUCOPHAGE-XR) 500 MG 24 hr tablet   Oral   Take 1 tablet (500 mg total) by mouth daily with breakfast. Patient taking differently: Take 500 mg by mouth daily.            Resume on 10/24.   . metoprolol tartrate (LOPRESSOR) 25 MG tablet      TAKE 1 TABLET BY MOUTH TWICE A DAY   180 tablet   3   . nitroGLYCERIN (NITROSTAT) 0.4 MG SL tablet   Sublingual   Place 1 tablet (0.4 mg total) under the tongue every 5 (five) minutes as needed for chest pain.   25 tablet   3   . pantoprazole (PROTONIX) 40 MG tablet   Oral   Take 40 mg by mouth as needed.          Marland Kitchen RANEXA 1000 MG SR tablet      TAKE 1 TABLET (1,000 MG TOTAL) BY MOUTH 2 (TWO) TIMES DAILY.   180 tablet   3    Allergies Amitriptyline; Cortisone; Lipitor; Macrodantin; and Motrin  Family History  Problem Relation Age of Onset  . Cancer Mother     bone CA, died @ 81  . Emphysema Father     died @ 29  . Emphysema Brother     died @ 28   . Diabetes Son  alive and well.   Social History Social History  Substance Use Topics  . Smoking status: Never Smoker   . Smokeless tobacco: Never Used  . Alcohol Use: No   Review of Systems  Constitutional: Negative for fever. Eyes: Negative for visual changes. ENT: Negative for sore throat. Right ear canal bleeding as above. Cardiovascular: Negative for chest pain. Respiratory: Negative for shortness of breath. Gastrointestinal: Negative for abdominal pain, vomiting and diarrhea. Genitourinary: Negative for dysuria. Musculoskeletal: Negative for back pain. Skin: Negative for rash. Neurological: Negative for headaches, focal weakness or numbness. ____________________________________________  PHYSICAL EXAM:  VITAL SIGNS: ED Triage Vitals  Enc Vitals Group     BP 05/01/15 1950 162/60 mmHg     Pulse Rate 05/01/15 1950 65     Resp --      Temp 05/01/15 1950 98.2 F (36.8 C)     Temp Source 05/01/15 1950 Oral     SpO2 05/01/15 1950 96 %     Weight 05/01/15 1950 138 lb (62.596 kg)     Height 05/01/15 1950 5\' 3"  (1.6 m)     Head Cir --      Peak Flow --      Pain Score --      Pain Loc --      Pain Edu? --      Excl. in [place]? --    Constitutional: Alert and oriented. Well appearing and in no distress. Eyes: Conjunctivae are normal. PERRL. Normal extraocular movements. Ears: Right ear canal with no active bleeding appreciated. She has a clot noted to the floor of the canal. Evacuation of the clot using a sterile cotton swab, reveals a small bleeding lesion to the posterior aspect of the canal proximally. The TM is intact without injection, erythema, bulging, or effusion.   Head: Normocephalic and atraumatic.   Nose: No congestion/rhinorrhea.   Mouth/Throat: Mucous membranes are moist.   Neck: Supple. No thyromegaly. Hematological/Lymphatic/Immunological: No cervical lymphadenopathy. Cardiovascular: Normal rate, regular rhythm.  Respiratory: Normal  respiratory effort. No wheezes/rales/rhonchi. Gastrointestinal: Soft and nontender. No distention. Musculoskeletal: Nontender with normal range of motion in all extremities.  Neurologic:  Normal gait without ataxia. Normal speech and language. No gross focal neurologic deficits are appreciated. Skin:  Skin is warm, dry and intact. No rash noted. Psychiatric: Mood and affect are normal. Patient exhibits appropriate insight and judgment. ____________________________________________  INITIAL IMPRESSION / ASSESSMENT AND PLAN / ED COURSE  Acute right ear canal bleeding due to small lesion or abrasion. No injury to the TM is appreciated. Patient encouraged to instill a Vaseline soaked gauze in the ear as needed. She will follow with a primary care provider for any ongoing symptoms. ____________________________________________  FINAL CLINICAL IMPRESSION(S) / ED DIAGNOSES  Final diagnoses:  Blood in ear canal, right  Ear canal abrasion, right, initial encounter      Melvenia Needles, PA-C 05/04/15 0000  Daymon Larsen, MD 05/05/15 1304

## 2015-05-02 NOTE — Telephone Encounter (Signed)
Stop Plavix. Continue low dose Aspirin. She should see ENT to see why she bled from there.

## 2015-05-02 NOTE — Telephone Encounter (Signed)
Pt daughter called, states she had to take pt to hospital last night due to pt ear bleeding and could not stop. ED wanted to let us know, states pt blood thinner may be too strong. Please call.

## 2015-05-10 ENCOUNTER — Telehealth: Payer: Self-pay | Admitting: *Deleted

## 2015-05-10 NOTE — Telephone Encounter (Signed)
Pt calling stating that she was to call a week later after being off Plavix She was taking 1 a day And she is giving [place] an update: She is not doing too good she states Having some pressure in chest, legs hurting, has had some dizzy spells Those symptoms happen when she exerts herself  She also mentioned that when she goes to bed and wakes up her hands are sleep and legs are sleep PCP told her that she had neuropathy She is not sure what to do now Would like some advise on this.  Please advise

## 2015-05-11 NOTE — Telephone Encounter (Signed)
S/w pt regarding Plavix. States she will start again today.

## 2015-05-11 NOTE — Telephone Encounter (Signed)
She can resume Plavix now.

## 2015-05-11 NOTE — Telephone Encounter (Signed)
S/w pt who had called last week after ER visit for ear bleeding. At that time, she was advised to stop plavix. She has not seen an ENT as her ear stopped bleeding but will try and go next week. States since she stopped plavix, her chest aches and legs hurt.  Would like to know when she can restart med as it concerns her to not be on this. Denies any signs of bleeding. Forward to MD

## 2015-05-23 DIAGNOSIS — H401132 Primary open-angle glaucoma, bilateral, moderate stage: Secondary | ICD-10-CM | POA: Diagnosis not present

## 2015-05-29 ENCOUNTER — Other Ambulatory Visit: Payer: Self-pay | Admitting: Otolaryngology

## 2015-05-29 DIAGNOSIS — R42 Dizziness and giddiness: Secondary | ICD-10-CM

## 2015-05-29 DIAGNOSIS — H9319 Tinnitus, unspecified ear: Secondary | ICD-10-CM | POA: Diagnosis not present

## 2015-05-29 DIAGNOSIS — H903 Sensorineural hearing loss, bilateral: Secondary | ICD-10-CM | POA: Diagnosis not present

## 2015-06-01 ENCOUNTER — Ambulatory Visit
Admission: RE | Admit: 2015-06-01 | Discharge: 2015-06-01 | Disposition: A | Discharge status: Home / Self Care | Payer: Medicare Other | Source: Ambulatory Visit | Attending: Otolaryngology | Admitting: Otolaryngology

## 2015-06-01 DIAGNOSIS — R42 Dizziness and giddiness: Secondary | ICD-10-CM | POA: Diagnosis present

## 2015-06-01 DIAGNOSIS — I6523 Occlusion and stenosis of bilateral carotid arteries: Secondary | ICD-10-CM | POA: Diagnosis not present

## 2015-06-06 ENCOUNTER — Other Ambulatory Visit: Payer: Self-pay | Admitting: Cardiovascular Disease

## 2015-06-06 MED ORDER — RANOLAZINE ER 1000 MG PO TB12: ORAL_TABLET | ORAL | Status: DC

## 2015-06-07 ENCOUNTER — Other Ambulatory Visit: Payer: Self-pay | Admitting: Cardiovascular Disease

## 2015-06-07 ENCOUNTER — Telehealth: Payer: Self-pay | Admitting: *Deleted

## 2015-06-07 MED ORDER — ROSUVASTATIN CALCIUM 5 MG PO TABS: ORAL_TABLET | ORAL | Status: DC

## 2015-06-07 NOTE — Telephone Encounter (Signed)
Called pt to inform her that her Rx had already been sent to her pharmacy and if she has any other problems, questions or concerns to call our office. Pt verbalized understanding.

## 2015-06-07 NOTE — Telephone Encounter (Signed)
°*  STAT* If patient is at the pharmacy, call can be transferred to refill team.   1. Which medications need to be refilled? (please list name of each medication and dose if known) crestor   2. Which pharmacy/location (including street and city if local pharmacy) is medication to be sent to? [facility] [address]  3. Do they need a 30 day or 90 day supply? 90 day

## 2015-06-08 ENCOUNTER — Other Ambulatory Visit: Payer: Self-pay | Admitting: Otolaryngology

## 2015-06-08 DIAGNOSIS — J310 Chronic rhinitis: Secondary | ICD-10-CM | POA: Diagnosis not present

## 2015-06-08 DIAGNOSIS — R42 Dizziness and giddiness: Secondary | ICD-10-CM

## 2015-06-22 ENCOUNTER — Ambulatory Visit
Admission: RE | Admit: 2015-06-22 | Discharge: 2015-06-22 | Disposition: A | Discharge status: Home / Self Care | Payer: Medicare Other | Source: Ambulatory Visit | Attending: Otolaryngology | Admitting: Otolaryngology

## 2015-06-22 ENCOUNTER — Other Ambulatory Visit
Admission: RE | Admit: 2015-06-22 | Discharge: 2015-06-22 | Disposition: A | Discharge status: Home / Self Care | Payer: Medicare Other | Source: Ambulatory Visit | Attending: Otolaryngology | Admitting: Otolaryngology

## 2015-06-22 DIAGNOSIS — R42 Dizziness and giddiness: Secondary | ICD-10-CM | POA: Insufficient documentation

## 2015-06-22 LAB — CREATININE, SERUM
Creatinine, Ser: 1.41 mg/dL — ABNORMAL HIGH (ref 0.44–1.00)
GFR calc non Af Amer: 36 mL/min — ABNORMAL LOW (ref >60)
GFR, EST AFRICAN AMERICAN: 41 mL/min — AB (ref >60)

## 2015-06-22 MED ORDER — GADOBENATE DIMEGLUMINE 529 MG/ML IV SOLN
10.0000 mL | Freq: Once | INTRAVENOUS | Status: AC | PRN
  Administered 2015-06-22: 6 mL via INTRAVENOUS

## 2015-07-04 DIAGNOSIS — R42 Dizziness and giddiness: Secondary | ICD-10-CM | POA: Diagnosis not present

## 2015-07-04 DIAGNOSIS — Z794 Long term (current) use of insulin: Secondary | ICD-10-CM | POA: Diagnosis not present

## 2015-07-04 DIAGNOSIS — Z9181 History of falling: Secondary | ICD-10-CM | POA: Diagnosis not present

## 2015-07-04 DIAGNOSIS — H9319 Tinnitus, unspecified ear: Secondary | ICD-10-CM | POA: Diagnosis not present

## 2015-07-04 DIAGNOSIS — J310 Chronic rhinitis: Secondary | ICD-10-CM | POA: Diagnosis not present

## 2015-07-04 DIAGNOSIS — E119 Type 2 diabetes mellitus without complications: Secondary | ICD-10-CM | POA: Diagnosis not present

## 2015-07-04 DIAGNOSIS — H903 Sensorineural hearing loss, bilateral: Secondary | ICD-10-CM | POA: Diagnosis not present

## 2015-07-04 DIAGNOSIS — R269 Unspecified abnormalities of gait and mobility: Secondary | ICD-10-CM | POA: Diagnosis not present

## 2015-07-04 DIAGNOSIS — K219 Gastro-esophageal reflux disease without esophagitis: Secondary | ICD-10-CM | POA: Diagnosis not present

## 2015-07-04 DIAGNOSIS — H9209 Otalgia, unspecified ear: Secondary | ICD-10-CM | POA: Diagnosis not present

## 2015-07-05 DIAGNOSIS — R42 Dizziness and giddiness: Secondary | ICD-10-CM | POA: Diagnosis not present

## 2015-07-11 DIAGNOSIS — E119 Type 2 diabetes mellitus without complications: Secondary | ICD-10-CM | POA: Diagnosis not present

## 2015-07-11 DIAGNOSIS — H903 Sensorineural hearing loss, bilateral: Secondary | ICD-10-CM | POA: Diagnosis not present

## 2015-07-11 DIAGNOSIS — R269 Unspecified abnormalities of gait and mobility: Secondary | ICD-10-CM | POA: Diagnosis not present

## 2015-07-11 DIAGNOSIS — H9319 Tinnitus, unspecified ear: Secondary | ICD-10-CM | POA: Diagnosis not present

## 2015-07-11 DIAGNOSIS — H9209 Otalgia, unspecified ear: Secondary | ICD-10-CM | POA: Diagnosis not present

## 2015-07-11 DIAGNOSIS — R42 Dizziness and giddiness: Secondary | ICD-10-CM | POA: Diagnosis not present

## 2015-07-13 DIAGNOSIS — H903 Sensorineural hearing loss, bilateral: Secondary | ICD-10-CM | POA: Diagnosis not present

## 2015-07-13 DIAGNOSIS — R42 Dizziness and giddiness: Secondary | ICD-10-CM | POA: Diagnosis not present

## 2015-07-13 DIAGNOSIS — R269 Unspecified abnormalities of gait and mobility: Secondary | ICD-10-CM | POA: Diagnosis not present

## 2015-07-13 DIAGNOSIS — H9209 Otalgia, unspecified ear: Secondary | ICD-10-CM | POA: Diagnosis not present

## 2015-07-13 DIAGNOSIS — E119 Type 2 diabetes mellitus without complications: Secondary | ICD-10-CM | POA: Diagnosis not present

## 2015-07-13 DIAGNOSIS — H9319 Tinnitus, unspecified ear: Secondary | ICD-10-CM | POA: Diagnosis not present

## 2015-07-17 DIAGNOSIS — E119 Type 2 diabetes mellitus without complications: Secondary | ICD-10-CM | POA: Diagnosis not present

## 2015-07-17 DIAGNOSIS — H9319 Tinnitus, unspecified ear: Secondary | ICD-10-CM | POA: Diagnosis not present

## 2015-07-17 DIAGNOSIS — H9209 Otalgia, unspecified ear: Secondary | ICD-10-CM | POA: Diagnosis not present

## 2015-07-17 DIAGNOSIS — R42 Dizziness and giddiness: Secondary | ICD-10-CM | POA: Diagnosis not present

## 2015-07-17 DIAGNOSIS — R269 Unspecified abnormalities of gait and mobility: Secondary | ICD-10-CM | POA: Diagnosis not present

## 2015-07-17 DIAGNOSIS — H903 Sensorineural hearing loss, bilateral: Secondary | ICD-10-CM | POA: Diagnosis not present

## 2015-07-19 DIAGNOSIS — H9319 Tinnitus, unspecified ear: Secondary | ICD-10-CM | POA: Diagnosis not present

## 2015-07-19 DIAGNOSIS — H903 Sensorineural hearing loss, bilateral: Secondary | ICD-10-CM | POA: Diagnosis not present

## 2015-07-19 DIAGNOSIS — E119 Type 2 diabetes mellitus without complications: Secondary | ICD-10-CM | POA: Diagnosis not present

## 2015-07-19 DIAGNOSIS — R269 Unspecified abnormalities of gait and mobility: Secondary | ICD-10-CM | POA: Diagnosis not present

## 2015-07-19 DIAGNOSIS — R42 Dizziness and giddiness: Secondary | ICD-10-CM | POA: Diagnosis not present

## 2015-07-19 DIAGNOSIS — H9209 Otalgia, unspecified ear: Secondary | ICD-10-CM | POA: Diagnosis not present

## 2015-07-25 DIAGNOSIS — H9319 Tinnitus, unspecified ear: Secondary | ICD-10-CM | POA: Diagnosis not present

## 2015-07-25 DIAGNOSIS — R269 Unspecified abnormalities of gait and mobility: Secondary | ICD-10-CM | POA: Diagnosis not present

## 2015-07-25 DIAGNOSIS — H903 Sensorineural hearing loss, bilateral: Secondary | ICD-10-CM | POA: Diagnosis not present

## 2015-07-25 DIAGNOSIS — H9209 Otalgia, unspecified ear: Secondary | ICD-10-CM | POA: Diagnosis not present

## 2015-07-25 DIAGNOSIS — E119 Type 2 diabetes mellitus without complications: Secondary | ICD-10-CM | POA: Diagnosis not present

## 2015-07-25 DIAGNOSIS — R42 Dizziness and giddiness: Secondary | ICD-10-CM | POA: Diagnosis not present

## 2015-07-27 DIAGNOSIS — H903 Sensorineural hearing loss, bilateral: Secondary | ICD-10-CM | POA: Diagnosis not present

## 2015-07-27 DIAGNOSIS — H9209 Otalgia, unspecified ear: Secondary | ICD-10-CM | POA: Diagnosis not present

## 2015-07-27 DIAGNOSIS — E119 Type 2 diabetes mellitus without complications: Secondary | ICD-10-CM | POA: Diagnosis not present

## 2015-07-27 DIAGNOSIS — R42 Dizziness and giddiness: Secondary | ICD-10-CM | POA: Diagnosis not present

## 2015-07-27 DIAGNOSIS — R269 Unspecified abnormalities of gait and mobility: Secondary | ICD-10-CM | POA: Diagnosis not present

## 2015-07-27 DIAGNOSIS — H9319 Tinnitus, unspecified ear: Secondary | ICD-10-CM | POA: Diagnosis not present

## 2015-07-31 ENCOUNTER — Telehealth: Payer: Self-pay | Admitting: *Deleted

## 2015-07-31 DIAGNOSIS — R269 Unspecified abnormalities of gait and mobility: Secondary | ICD-10-CM | POA: Diagnosis not present

## 2015-07-31 DIAGNOSIS — H9209 Otalgia, unspecified ear: Secondary | ICD-10-CM | POA: Diagnosis not present

## 2015-07-31 DIAGNOSIS — H9319 Tinnitus, unspecified ear: Secondary | ICD-10-CM | POA: Diagnosis not present

## 2015-07-31 DIAGNOSIS — H903 Sensorineural hearing loss, bilateral: Secondary | ICD-10-CM | POA: Diagnosis not present

## 2015-07-31 DIAGNOSIS — R42 Dizziness and giddiness: Secondary | ICD-10-CM | POA: Diagnosis not present

## 2015-07-31 DIAGNOSIS — E119 Type 2 diabetes mellitus without complications: Secondary | ICD-10-CM | POA: Diagnosis not present

## 2015-07-31 NOTE — Telephone Encounter (Signed)
Pt states she is having trouble with the right 1st toenail it has thickened and grown to the skin, she has an appt 08/13/2015, and wanted to know if she needed to be off of her Plavix before the appt.  I informed pt she should stay on the Plavix until evaluated at our office and if she needed to be off Plavix we would contact her doctor for instructions.

## 2015-08-02 DIAGNOSIS — H9209 Otalgia, unspecified ear: Secondary | ICD-10-CM | POA: Diagnosis not present

## 2015-08-02 DIAGNOSIS — R42 Dizziness and giddiness: Secondary | ICD-10-CM | POA: Diagnosis not present

## 2015-08-02 DIAGNOSIS — E119 Type 2 diabetes mellitus without complications: Secondary | ICD-10-CM | POA: Diagnosis not present

## 2015-08-02 DIAGNOSIS — H9319 Tinnitus, unspecified ear: Secondary | ICD-10-CM | POA: Diagnosis not present

## 2015-08-02 DIAGNOSIS — R269 Unspecified abnormalities of gait and mobility: Secondary | ICD-10-CM | POA: Diagnosis not present

## 2015-08-02 DIAGNOSIS — H903 Sensorineural hearing loss, bilateral: Secondary | ICD-10-CM | POA: Diagnosis not present

## 2015-08-06 DIAGNOSIS — H9319 Tinnitus, unspecified ear: Secondary | ICD-10-CM | POA: Diagnosis not present

## 2015-08-06 DIAGNOSIS — R42 Dizziness and giddiness: Secondary | ICD-10-CM | POA: Diagnosis not present

## 2015-08-06 DIAGNOSIS — H903 Sensorineural hearing loss, bilateral: Secondary | ICD-10-CM | POA: Diagnosis not present

## 2015-08-06 DIAGNOSIS — R269 Unspecified abnormalities of gait and mobility: Secondary | ICD-10-CM | POA: Diagnosis not present

## 2015-08-06 DIAGNOSIS — H9209 Otalgia, unspecified ear: Secondary | ICD-10-CM | POA: Diagnosis not present

## 2015-08-06 DIAGNOSIS — E119 Type 2 diabetes mellitus without complications: Secondary | ICD-10-CM | POA: Diagnosis not present

## 2015-08-08 DIAGNOSIS — R42 Dizziness and giddiness: Secondary | ICD-10-CM | POA: Diagnosis not present

## 2015-08-08 DIAGNOSIS — R269 Unspecified abnormalities of gait and mobility: Secondary | ICD-10-CM | POA: Diagnosis not present

## 2015-08-08 DIAGNOSIS — H903 Sensorineural hearing loss, bilateral: Secondary | ICD-10-CM | POA: Diagnosis not present

## 2015-08-08 DIAGNOSIS — H9319 Tinnitus, unspecified ear: Secondary | ICD-10-CM | POA: Diagnosis not present

## 2015-08-08 DIAGNOSIS — E119 Type 2 diabetes mellitus without complications: Secondary | ICD-10-CM | POA: Diagnosis not present

## 2015-08-08 DIAGNOSIS — H9209 Otalgia, unspecified ear: Secondary | ICD-10-CM | POA: Diagnosis not present

## 2015-08-13 ENCOUNTER — Ambulatory Visit: Payer: Medicare Other | Admitting: Podiatry

## 2015-08-16 DIAGNOSIS — H903 Sensorineural hearing loss, bilateral: Secondary | ICD-10-CM | POA: Diagnosis not present

## 2015-08-16 DIAGNOSIS — R269 Unspecified abnormalities of gait and mobility: Secondary | ICD-10-CM | POA: Diagnosis not present

## 2015-08-16 DIAGNOSIS — H9209 Otalgia, unspecified ear: Secondary | ICD-10-CM | POA: Diagnosis not present

## 2015-08-16 DIAGNOSIS — E119 Type 2 diabetes mellitus without complications: Secondary | ICD-10-CM | POA: Diagnosis not present

## 2015-08-16 DIAGNOSIS — H9319 Tinnitus, unspecified ear: Secondary | ICD-10-CM | POA: Diagnosis not present

## 2015-08-16 DIAGNOSIS — R42 Dizziness and giddiness: Secondary | ICD-10-CM | POA: Diagnosis not present

## 2015-09-13 ENCOUNTER — Other Ambulatory Visit: Payer: Self-pay | Admitting: *Deleted

## 2015-09-13 MED ORDER — CLOPIDOGREL BISULFATE 75 MG PO TABS: ORAL_TABLET | ORAL | Status: DC

## 2015-09-13 NOTE — Telephone Encounter (Signed)
Requested Prescriptions   Signed Prescriptions Disp Refills  . clopidogrel (PLAVIX) 75 MG tablet 90 tablet 3    Sig: TAKE 1 TABLET (75 MG TOTAL) BY MOUTH DAILY WITH BREAKFAST.    Authorizing Provider: Kathlyn Sacramento A    Ordering User: Britt Bottom

## 2015-09-14 ENCOUNTER — Other Ambulatory Visit: Payer: Self-pay

## 2015-09-14 MED ORDER — CLOPIDOGREL BISULFATE 75 MG PO TABS: ORAL_TABLET | ORAL | Status: DC

## 2015-09-14 NOTE — Telephone Encounter (Signed)
Refill sent for Plavix.

## 2015-09-21 ENCOUNTER — Ambulatory Visit (INDEPENDENT_AMBULATORY_CARE_PROVIDER_SITE_OTHER): Payer: Medicare Other | Admitting: Cardiovascular Disease

## 2015-09-21 ENCOUNTER — Encounter: Payer: Self-pay | Admitting: Cardiovascular Disease

## 2015-09-21 VITALS — BP 120/60 | HR 58 | Ht 64.0 in | Wt 144.2 lb

## 2015-09-21 DIAGNOSIS — I10 Essential (primary) hypertension: Secondary | ICD-10-CM

## 2015-09-21 DIAGNOSIS — I739 Peripheral vascular disease, unspecified: Secondary | ICD-10-CM | POA: Diagnosis not present

## 2015-09-21 DIAGNOSIS — I25118 Atherosclerotic heart disease of native coronary artery with other forms of angina pectoris: Secondary | ICD-10-CM | POA: Diagnosis not present

## 2015-09-21 DIAGNOSIS — E785 Hyperlipidemia, unspecified: Secondary | ICD-10-CM | POA: Diagnosis not present

## 2015-09-21 MED ORDER — CARVEDILOL 3.125 MG PO TABS: 3.1250 mg | ORAL_TABLET | Freq: Two times a day (BID) | ORAL | Status: DC

## 2015-09-21 NOTE — Assessment & Plan Note (Signed)
Blood pressure is well controlled on current medications. 

## 2015-09-21 NOTE — Assessment & Plan Note (Signed)
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 Current symptoms include pain below the knee mostly in the anterior part at rest and occasionally with activities. I suspect that this is likely due to neuropathy and not claudication. Continue medical therapy.

## 2015-09-21 NOTE — Assessment & Plan Note (Signed)
She is on small dose rosuvastatin every other day due to myalgia.

## 2015-09-21 NOTE — Patient Instructions (Signed)
Medication Instructions:  Your physician has recommended you make the following change in your medication:  STOP metoprolol START coreg 3.125mg  twice daily   Labwork: none  Testing/Procedures: none  Follow-Up: Your physician wants you to follow-up in: six months with Dr. Fletcher Anon.  You will receive a reminder letter in the mail two months in advance. If you don't receive a letter, please call our office to schedule the follow-up appointment.   Any Other Special Instructions Will Be Listed Below (If Applicable).     If you need a refill on your cardiac medications before your next appointment, please call your pharmacy.

## 2015-09-21 NOTE — Assessment & Plan Note (Signed)
He is doing reasonably well on medical therapy. I can't really explain her symptoms of exertional dyspnea and fatigue. There might be a component of anxiety and she might be stressed about taking care of her husband. Noninvasive cardiac workup last year has been overall reassuring. I'm going to continue medical therapy. The only change I elected to make today was to switch metoprolol to carvedilol given that she is mildly bradycardic.

## 2015-09-21 NOTE — Progress Notes (Signed)
HPI  This is a 75 year old female who is here today for a followup regarding coronary artery disease status post CABG and PCI.  She has known history of diabetes and hypertension. She had CABG in June, 2014 after NSTEMI.  She had recurrent angina post CABG. Cardiac cath in 05/2013 showed significant 3 vessel CAD with diffuse diabetic vessels. SVG to RPDA/OM3 was found to be occluded. OM3 got collaterals from RCA. EF was low normal. I performed Successful PCI and DES placement to proximal and mid right coronary artery. Left circumflex occlusion was left to be treated medically.  She continued to have significant angina even after PCI which improved with  Ranexa with . She did not tolerate statins in the past due to severe myalgia but was ultimately able to tolerate small dose Crestor every other day. She was hospitalized in January 2016 for chest pain. She ruled out for myocardial infarction. Nuclear stress test was normal.   She had chronic right leg swelling post CABG with negative workup for DVT. Most recent echocardiogram in August 2016 showed normal LV systolic function, mild to moderate mitral regurgitation and mild pulmonary hypertension. Holter monitor was done for palpitations and showed no significant arrhythmia. She complained of some atypical leg pain. ABIs was close to normal bilaterally with moderate nonobstructive disease on duplex. She had bleeding from her ear canal and has been seen by ENT.  Plavix was only stopped for a few days. She denies any chest pain at the present time. She does complain of continued symptoms of exertional dyspnea and fatigue of unclear etiology. 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 She complains of dizziness when she stands up quickly.   Allergies  Allergen Reactions  . Amitriptyline Other (See Comments)    disoriented  . Cortisone Other (See Comments)    Eyes hurt and sugar goes up  . Lipitor  [Atorvastatin]     Fatigue and weakness and high and low doses   . Macrodantin [Nitrofurantoin] Itching and Swelling  . Motrin [Ibuprofen] Other (See Comments)    Fluid retention     Current Outpatient Prescriptions on File Prior to Visit  Medication Sig Dispense Refill  . acetaminophen (TYLENOL ARTHRITIS PAIN) 650 MG CR tablet Take 1,300 mg by mouth as needed.     . Artificial Tear Ointment (ARTIFICIAL TEARS) ointment Place 1 application into both eyes at bedtime.    Marland Kitchen aspirin 81 MG tablet Take 1 tablet (81 mg total) by mouth daily.    . clopidogrel (PLAVIX) 75 MG tablet TAKE 1 TABLET (75 MG TOTAL) BY MOUTH DAILY WITH BREAKFAST. 90 tablet 3  . ketotifen (THERA TEARS ALLERGY) 0.025 % ophthalmic solution Place 1 drop into both eyes as needed (for allergy eyes.).     Marland Kitchen latanoprost (XALATAN) 0.005 % ophthalmic solution Place 1 drop into both eyes at bedtime.     Marland Kitchen levothyroxine (SYNTHROID, LEVOTHROID) 88 MCG tablet Take 88 mcg by mouth daily before breakfast.    . lisinopril (PRINIVIL,ZESTRIL) 5 MG tablet TAKE 1 TABLET BY MOUTH DAILY 90 tablet 3  . LORazepam (ATIVAN) 1 MG tablet Take 2.5 mg by mouth at bedtime.    . metFORMIN (GLUCOPHAGE-XR) 500 MG 24 hr tablet Take 1 tablet (500 mg total) by mouth daily with breakfast. (Patient taking differently: Take 1,000 mg by mouth 2 (two) times daily. )    . metoprolol tartrate (LOPRESSOR) 25 MG tablet TAKE 1 TABLET BY  MOUTH TWICE A DAY 180 tablet 3  . nitroGLYCERIN (NITROSTAT) 0.4 MG SL tablet Place 1 tablet (0.4 mg total) under the tongue every 5 (five) minutes as needed for chest pain. 25 tablet 3  . pantoprazole (PROTONIX) 40 MG tablet Take 40 mg by mouth as needed.     . ranolazine (RANEXA) 1000 MG SR tablet TAKE 1 TABLET (1,000 MG TOTAL) BY MOUTH 2 (TWO) TIMES DAILY. 180 tablet 2  . rosuvastatin (CRESTOR) 5 MG tablet TAKE 1 TABLET (5 MG TOTAL) BY MOUTH DAILY. (Patient taking differently: TAKE 1 TABLET (5 MG TOTAL) BY MOUTH EVERY OTHER DAY.) 90  tablet 2   No current facility-administered medications on file prior to visit.     Past Medical History  Diagnosis Date  . Depression   . Hypothyroidism   . GERD (gastroesophageal reflux disease)   . IBS (irritable bowel syndrome)   . Lymphocytosis 03/24/2012  . PONV (postoperative nausea and vomiting)   . Hyperlipidemia   . Coronary artery disease     a. nl myoview ~ 2006;  b. 01/2013 s/p MI and CABG x 4 (VG->D2, VG->RPDA->OM3, LIMA->LAD;  c. 05/2013 Cath/PCI: LM nl, LAD 60-70p/m, D1 70p, LCX 154m, OM2 60p, RCA 50-60p/5m (3.0x33 Xience DES), VG->D2 60, VG->RPDA/OM3 100, LIMA->LAD nl w 70 dLAD, EF 50%.  . Hypertension   . History of pneumonia 1985  . Diabetes mellitus, type 2 ([place])     "dx'd 1992" (05/25/2013)  . Anemia     "after OHS in 01/2013" (05/25/2013)  . [id number])     "monthly" (05/25/2013)  . OA (osteoarthritis)   . Arthritis     "joints" (05/25/2013)  . Anxiety   . Glaucoma     bilateral     Past Surgical History  Procedure Laterality Date  . Thyroidectomy  2010  . Coronary artery bypass graft N/A 01/07/2013    Procedure: CORONARY ARTERY BYPASS GRAFTING (CABG);  Surgeon: Melrose Nakayama, MD;  Location: [facility];  Service: Open Heart Surgery;  Laterality: N/A;  . Cardiac catheterization  01/2013  . Carpal tunnel release Bilateral 1990-1992  . Knee arthroscopy Bilateral 1993-1996  . Hammer toe surgery Right 2009  . Bladder suspension  2005  . Vaginal hysterectomy  1974  . Coronary angioplasty with stent placement  05/25/2013    "1" (05/25/2013)  . Left heart catheterization with coronary angiogram N/A 01/05/2013    Procedure: LEFT HEART CATHETERIZATION WITH CORONARY ANGIOGRAM;  Surgeon: Wellington Hampshire, MD;  Location: [facility] CATH LAB;  Service: Cardiovascular;  Laterality: N/A;  . Left heart catheterization with coronary/graft angiogram N/A 05/25/2013    Procedure: LEFT HEART CATHETERIZATION WITH Beatrix Fetters;  Surgeon: Wellington Hampshire, MD;   Location: [facility] CATH LAB;  Service: Cardiovascular;  Laterality: N/A;  . Percutaneous coronary stent intervention (pci-s)  05/25/2013    Procedure: PERCUTANEOUS CORONARY STENT INTERVENTION (PCI-S);  Surgeon: Wellington Hampshire, MD;  Location: [facility] CATH LAB;  Service: Cardiovascular;;     Family History  Problem Relation Age of Onset  . Cancer Mother     bone CA, died @ 80  . Emphysema Father     died @ 23  . Emphysema Brother     died @ 21  . Diabetes Son     alive and well.     Social History   Social History  . Marital Status: Married    Spouse Name: N/A  . Number of Children: N/A  . Years of Education: N/A   Occupational  History  . Not on file.   Social History Main Topics  . Smoking status: Never Smoker   . Smokeless tobacco: Never Used  . Alcohol Use: No  . Drug Use: No  . Sexual Activity: Not Currently   Other Topics Concern  . Not on file   Social History Narrative   Lives in [place] with husband.     PHYSICAL EXAM   BP 120/60 mmHg  Pulse 58  Ht 5\' 4"  (1.626 m)  Wt 144 lb 4 oz (65.431 kg)  BMI 24.75 kg/m2 Constitutional: She is oriented to person, place, and time. She appears well-developed and well-nourished. No distress.  HENT: No nasal discharge.  Head: Normocephalic and atraumatic.  Eyes: Pupils are equal and round. Right eye exhibits no discharge. Left eye exhibits no discharge.  Neck: Normal range of motion. Neck supple. No JVD present. No thyromegaly present.  Cardiovascular: Normal rate, regular rhythm, normal heart sounds. Exam reveals no gallop and no friction rub. No murmur heard.  Pulmonary/Chest: Effort normal and decreased breath sounds at the left base. No stridor. No respiratory distress. She has no wheezes. She has no rales. She exhibits no tenderness.  Abdominal: Soft. Bowel sounds are normal. She exhibits no distension. There is no tenderness. There is no rebound and no guarding.  Musculoskeletal: Normal range of motion. She exhibits  no edema and no tenderness.  Neurological: She is alert and oriented to person, place, and time. Coordination normal.  Skin: Skin is warm and dry. No rash noted. She is not diaphoretic. No erythema. No pallor.  Psychiatric: She has a normal mood and affect. Her behavior is normal. Judgment and thought content normal.    EKG:  Sinus  Bradycardia  WITHIN NORMAL LIMITS   ASSESSMENT AND PLAN

## 2015-10-10 DIAGNOSIS — E890 Postprocedural hypothyroidism: Secondary | ICD-10-CM | POA: Diagnosis not present

## 2015-10-10 DIAGNOSIS — I129 Hypertensive chronic kidney disease with stage 1 through stage 4 chronic kidney disease, or unspecified chronic kidney disease: Secondary | ICD-10-CM | POA: Diagnosis not present

## 2015-10-10 DIAGNOSIS — F329 Major depressive disorder, single episode, unspecified: Secondary | ICD-10-CM | POA: Diagnosis not present

## 2015-10-10 DIAGNOSIS — I209 Angina pectoris, unspecified: Secondary | ICD-10-CM | POA: Diagnosis not present

## 2015-10-10 DIAGNOSIS — I700 Atherosclerosis of aorta: Secondary | ICD-10-CM | POA: Diagnosis not present

## 2015-10-10 DIAGNOSIS — Z1389 Encounter for screening for other disorder: Secondary | ICD-10-CM | POA: Diagnosis not present

## 2015-10-10 DIAGNOSIS — N183 Chronic kidney disease, stage 3 (moderate): Secondary | ICD-10-CM | POA: Diagnosis not present

## 2015-10-10 DIAGNOSIS — K219 Gastro-esophageal reflux disease without esophagitis: Secondary | ICD-10-CM | POA: Diagnosis not present

## 2015-10-10 DIAGNOSIS — E1151 Type 2 diabetes mellitus with diabetic peripheral angiopathy without gangrene: Secondary | ICD-10-CM | POA: Diagnosis not present

## 2015-10-10 DIAGNOSIS — I2510 Atherosclerotic heart disease of native coronary artery without angina pectoris: Secondary | ICD-10-CM | POA: Diagnosis not present

## 2015-10-10 DIAGNOSIS — Z6826 Body mass index (BMI) 26.0-26.9, adult: Secondary | ICD-10-CM | POA: Diagnosis not present

## 2015-10-10 DIAGNOSIS — E042 Nontoxic multinodular goiter: Secondary | ICD-10-CM | POA: Diagnosis not present

## 2015-10-16 ENCOUNTER — Other Ambulatory Visit: Payer: Self-pay

## 2015-10-16 ENCOUNTER — Encounter: Payer: Self-pay | Admitting: Gastroenterology

## 2015-10-16 ENCOUNTER — Ambulatory Visit (INDEPENDENT_AMBULATORY_CARE_PROVIDER_SITE_OTHER): Payer: Medicare Other | Admitting: Gastroenterology

## 2015-10-16 VITALS — BP 145/68 | HR 71 | Temp 97.9°F | Ht 64.0 in | Wt 144.8 lb

## 2015-10-16 DIAGNOSIS — R197 Diarrhea, unspecified: Secondary | ICD-10-CM | POA: Diagnosis not present

## 2015-10-16 DIAGNOSIS — R1310 Dysphagia, unspecified: Secondary | ICD-10-CM | POA: Diagnosis not present

## 2015-10-16 NOTE — Progress Notes (Signed)
Gastroenterology Consultation  Referring Provider:     Burnard Bunting, MD Primary Care Physician:  Geoffery Lyons, MD Primary Gastroenterologist:  Dr. Allen Norris     Reason for Consultation:     Diarrhea        HPI:   Angela Barrett is a 75 y.o. y/o female referred for consultation & management of Diarrhea by Dr. Geoffery Lyons, MD.  This patient comes today with a history of diarrhea. The patient was told that she had irritable bowel syndrome many years ago but had been doing well. Recently she started to have both urinary and stool incontinence. The patient has had a surgery to tach up her bladder and she states that she had rectal prolapse repair at the same time. The patient had been doing reasonably well but now has urgency and feelings of incomplete evacuation when she moves her bowels. The patient had been seen in the past by Dr. Candace Cruise but she reports that they did not click and she wanted to come back and see me since I returned back to the area to practice. She reports that she has abdominal pain and some bloating. When she has worsening of her diarrhea she can have 6-8 bowel movements a day. She denies any rectal bleeding. There is also no report of nausea or vomiting. The patient does have a history of having an esophageal stricture with dilation in the past. The patient states that she is now having some trouble with some of her pills and thinks she may need a repeat dilation.  Past Medical History  Diagnosis Date  . Depression   . Hypothyroidism   . GERD (gastroesophageal reflux disease)   . IBS (irritable bowel syndrome)   . Lymphocytosis 03/24/2012  . PONV (postoperative nausea and vomiting)   . Hyperlipidemia   . Coronary artery disease     a. nl myoview ~ 2006;  b. 01/2013 s/p MI and CABG x 4 (VG->D2, VG->RPDA->OM3, LIMA->LAD;  c. 05/2013 Cath/PCI: LM nl, LAD 60-70p/m, D1 70p, LCX 154m, OM2 60p, RCA 50-60p/57m (3.0x33 Xience DES), VG->D2 60, VG->RPDA/OM3 100, LIMA->LAD nl w 70  dLAD, EF 50%.  . Hypertension   . History of pneumonia 1985  . Diabetes mellitus, type 2 ([place])     "dx'd 1992" (05/25/2013)  . Anemia     "after OHS in 01/2013" (05/25/2013)  . [id number])     "monthly" (05/25/2013)  . OA (osteoarthritis)   . Arthritis     "joints" (05/25/2013)  . Anxiety   . Glaucoma     bilateral    Past Surgical History  Procedure Laterality Date  . Thyroidectomy  2010  . Coronary artery bypass graft N/A 01/07/2013    Procedure: CORONARY ARTERY BYPASS GRAFTING (CABG);  Surgeon: Melrose Nakayama, MD;  Location: [facility];  Service: Open Heart Surgery;  Laterality: N/A;  . Cardiac catheterization  01/2013  . Carpal tunnel release Bilateral 1990-1992  . Knee arthroscopy Bilateral 1993-1996  . Hammer toe surgery Right 2009  . Bladder suspension  2005  . Vaginal hysterectomy  1974  . Coronary angioplasty with stent placement  05/25/2013    "1" (05/25/2013)  . Left heart catheterization with coronary angiogram N/A 01/05/2013    Procedure: LEFT HEART CATHETERIZATION WITH CORONARY ANGIOGRAM;  Surgeon: Wellington Hampshire, MD;  Location: [facility] CATH LAB;  Service: Cardiovascular;  Laterality: N/A;  . Left heart catheterization with coronary/graft angiogram N/A 05/25/2013    Procedure: LEFT HEART CATHETERIZATION WITH CORONARY/GRAFT ANGIOGRAM;  Surgeon: Wellington Hampshire, MD;  Location: [facility] CATH LAB;  Service: Cardiovascular;  Laterality: N/A;  . Percutaneous coronary stent intervention (pci-s)  05/25/2013    Procedure: PERCUTANEOUS CORONARY STENT INTERVENTION (PCI-S);  Surgeon: Wellington Hampshire, MD;  Location: [facility] CATH LAB;  Service: Cardiovascular;;    Prior to Admission medications   Medication Sig Start Date End Date Taking? Authorizing Provider  acetaminophen (TYLENOL ARTHRITIS PAIN) 650 MG CR tablet Take 1,300 mg by mouth as needed.    Yes Historical Provider, MD  Artificial Tear Ointment (ARTIFICIAL TEARS) ointment Place 1 application into both eyes at bedtime.   Yes  Historical Provider, MD  aspirin 81 MG tablet Take 1 tablet (81 mg total) by mouth daily. 05/26/13  Yes Rogelia Mire, NP  carvedilol (COREG) 3.125 MG tablet Take 1 tablet (3.125 mg total) by mouth 2 (two) times daily. 09/21/15  Yes Wellington Hampshire, MD  clopidogrel (PLAVIX) 75 MG tablet TAKE 1 TABLET (75 MG TOTAL) BY MOUTH DAILY WITH BREAKFAST. 09/14/15  Yes Wellington Hampshire, MD  insulin detemir (LEVEMIR) 100 UNIT/ML injection Inject 35 Units into the skin at bedtime.   Yes Historical Provider, MD  ketotifen (THERA TEARS ALLERGY) 0.025 % ophthalmic solution Place 1 drop into both eyes as needed (for allergy eyes.).    Yes Historical Provider, MD  latanoprost (XALATAN) 0.005 % ophthalmic solution Place 1 drop into both eyes at bedtime.  10/11/13  Yes Historical Provider, MD  levothyroxine (SYNTHROID, LEVOTHROID) 88 MCG tablet Take 88 mcg by mouth daily before breakfast.   Yes Historical Provider, MD  lisinopril (PRINIVIL,ZESTRIL) 5 MG tablet TAKE 1 TABLET BY MOUTH DAILY 01/02/15  Yes Wellington Hampshire, MD  LORazepam (ATIVAN) 1 MG tablet Take 2.5 mg by mouth at bedtime.   Yes Historical Provider, MD  metFORMIN (GLUCOPHAGE-XR) 500 MG 24 hr tablet Take 1 tablet (500 mg total) by mouth daily with breakfast. Patient taking differently: Take 1,000 mg by mouth 2 (two) times daily.  05/26/13  Yes Rogelia Mire, NP  metoprolol tartrate (LOPRESSOR) 25 MG tablet Take 25 mg by mouth 2 (two) times daily. 09/03/15  Yes Historical Provider, MD  nitroGLYCERIN (NITROSTAT) 0.4 MG SL tablet Place 1 tablet (0.4 mg total) under the tongue every 5 (five) minutes as needed for chest pain. 05/26/13  Yes Rogelia Mire, NP  ONETOUCH DELICA LANCETS [id number] [place]  10/13/15  Yes Historical Provider, MD  pantoprazole (PROTONIX) 40 MG tablet Take 40 mg by mouth as needed.    Yes Historical Provider, MD  ranolazine (RANEXA) 1000 MG SR tablet TAKE 1 TABLET (1,000 MG TOTAL) BY MOUTH 2 (TWO) TIMES DAILY. 06/06/15  Yes Wellington Hampshire, MD  rosuvastatin (CRESTOR) 5 MG tablet TAKE 1 TABLET (5 MG TOTAL) BY MOUTH DAILY. Patient taking differently: TAKE 1 TABLET (5 MG TOTAL) BY MOUTH EVERY OTHER DAY. 06/07/15  Yes Wellington Hampshire, MD    Family History  Problem Relation Age of Onset  . Cancer Mother     bone CA, died @ 16  . Emphysema Father     died @ 63  . Emphysema Brother     died @ 59  . Diabetes Son     alive and well.     Social History  Substance Use Topics  . Smoking status: Never Smoker   . Smokeless tobacco: Never Used  . Alcohol Use: No    Allergies as of 10/16/2015 - Review Complete 10/16/2015  Allergen Reaction Noted  .  Amitriptyline Other (See Comments) 01/05/2013  . Cortisone Other (See Comments) 01/05/2013  . Lipitor [atorvastatin]  05/26/2013  . Macrodantin [nitrofurantoin] Itching and Swelling 01/05/2013  . Motrin [ibuprofen] Other (See Comments) 01/05/2013    Review of Systems:    All systems reviewed and negative except where noted in HPI.   Physical Exam:  BP 145/68 mmHg  Pulse 71  Temp(Src) 97.9 F (36.6 C) (Oral)  Ht 5\' 4"  (1.626 m)  Wt 144 lb 12.8 oz (65.681 kg)  BMI 24.84 kg/m2 No LMP recorded. Patient has had a hysterectomy. Psych:  Alert and cooperative. Normal mood and affect. General:   Alert,  Well-developed, well-nourished, pleasant and cooperative in NAD Head:  Normocephalic and atraumatic. Eyes:  Sclera clear, no icterus.   Conjunctiva pink. Ears:  Normal auditory acuity. Nose:  No deformity, discharge, or lesions. Mouth:  No deformity or lesions,oropharynx pink & moist. Neck:  Supple; no masses or thyromegaly. Lungs:  Respirations even and unlabored.  Clear throughout to auscultation.   No wheezes, crackles, or rhonchi. No acute distress. Heart:  Regular rate and rhythm; no murmurs, clicks, rubs, or gallops. Abdomen:  Normal bowel sounds.  No bruits.  Soft, non-tender and non-distended without masses, hepatosplenomegaly or hernias noted.  No guarding or  rebound tenderness.  Negative Carnett sign.   Rectal:  Deferred.  Msk:  Symmetrical without gross deformities.  Good, equal movement & strength bilaterally. Pulses:  Normal pulses noted. Extremities:  No clubbing or edema.  No cyanosis. Neurologic:  Alert and oriented x3;  grossly normal neurologically. Skin:  Intact without significant lesions or rashes.  No jaundice. Lymph Nodes:  No significant cervical adenopathy. Psych:  Alert and cooperative. Normal mood and affect.  Imaging Studies: No results found.  Assessment and Plan:   DANYKA TALWAR is a 75 y.o. y/o female who comes in today with a history of diarrhea and urgency. She also reports that she has some feelings of incomplete evacuation and dysphagia. The patient has been told that she has irritable bowel syndrome in the past but since her symptoms have gotten worse recently she may have developed microscopic colitis. The patient does take care of a husband who has dementia. She will be set up for an EGD and colonoscopy for her diarrhea and dysphagia. I have discussed risks & benefits which include, but are not limited to, bleeding, infection, perforation & drug reaction.  The patient agrees with this plan & written consent will be obtained.      Note: This dictation was prepared with Dragon dictation along with smaller phrase technology. Any transcriptional errors that result from this process are unintentional.

## 2015-11-01 ENCOUNTER — Other Ambulatory Visit: Payer: Self-pay

## 2015-11-01 ENCOUNTER — Encounter: Admission: AD | Payer: Self-pay | Source: Ambulatory Visit

## 2015-11-01 ENCOUNTER — Ambulatory Visit: Admission: AD | Admit: 2015-11-01 | Payer: Medicare Other | Source: Ambulatory Visit | Admitting: Gastroenterology

## 2015-11-01 ENCOUNTER — Telehealth: Payer: Self-pay | Admitting: Cardiovascular Disease

## 2015-11-01 SURGERY — COLONOSCOPY WITH PROPOFOL
Anesthesia: Choice

## 2015-11-01 NOTE — Telephone Encounter (Signed)
Pt having EGD at [facility] 11/20/15 [place] Information form completed by Christell Faith, PA-C Faxed to [phone]

## 2015-11-15 ENCOUNTER — Telehealth: Payer: Self-pay

## 2015-11-15 NOTE — Telephone Encounter (Signed)
Pt has been notified that per Christell Faith, PA on behalf of Dr. Fletcher Anon, pt is to stop Plavix 5 days prior to colonoscopy/EGD. Pt stated she had stopped this yesterday. Pt has also been notified to restart 1 day after unless otherwise directed by Dr. Allen Norris.

## 2015-11-19 ENCOUNTER — Telehealth: Payer: Self-pay | Admitting: Cardiovascular Disease

## 2015-11-19 NOTE — Telephone Encounter (Signed)
Pt states she is having an EGD and a colonoscopy tomorrow, and wanted to make sure she is ok to have it done. Please call and advise.

## 2015-11-19 NOTE — Telephone Encounter (Signed)
S/w Angi at [facility], [place] office, who states pt does not need clearance for tomorrow's procedure. Returned patient's call. Left detailed message on pt VM w/CB number if questions.

## 2015-11-20 ENCOUNTER — Ambulatory Visit
Admission: RE | Admit: 2015-11-20 | Discharge: 2015-11-20 | Disposition: A | Discharge status: Home / Self Care | Payer: Medicare Other | Source: Ambulatory Visit | Attending: Gastroenterology | Admitting: Gastroenterology

## 2015-11-20 ENCOUNTER — Ambulatory Visit: Payer: Medicare Other | Admitting: Anesthesiology

## 2015-11-20 ENCOUNTER — Encounter
Admission: RE | Disposition: A | Discharge status: Home / Self Care | Payer: Self-pay | Source: Ambulatory Visit | Attending: Gastroenterology

## 2015-11-20 ENCOUNTER — Encounter: Payer: Self-pay | Admitting: Anesthesiology

## 2015-11-20 DIAGNOSIS — R197 Diarrhea, unspecified: Secondary | ICD-10-CM | POA: Diagnosis not present

## 2015-11-20 DIAGNOSIS — Z794 Long term (current) use of insulin: Secondary | ICD-10-CM | POA: Diagnosis not present

## 2015-11-20 DIAGNOSIS — F419 Anxiety disorder, unspecified: Secondary | ICD-10-CM | POA: Diagnosis not present

## 2015-11-20 DIAGNOSIS — Z888 Allergy status to other drugs, medicaments and biological substances status: Secondary | ICD-10-CM | POA: Insufficient documentation

## 2015-11-20 DIAGNOSIS — K529 Noninfective gastroenteritis and colitis, unspecified: Secondary | ICD-10-CM | POA: Diagnosis not present

## 2015-11-20 DIAGNOSIS — R1310 Dysphagia, unspecified: Secondary | ICD-10-CM | POA: Diagnosis not present

## 2015-11-20 DIAGNOSIS — Z955 Presence of coronary angioplasty implant and graft: Secondary | ICD-10-CM | POA: Insufficient documentation

## 2015-11-20 DIAGNOSIS — F329 Major depressive disorder, single episode, unspecified: Secondary | ICD-10-CM | POA: Diagnosis not present

## 2015-11-20 DIAGNOSIS — K219 Gastro-esophageal reflux disease without esophagitis: Secondary | ICD-10-CM | POA: Insufficient documentation

## 2015-11-20 DIAGNOSIS — K449 Diaphragmatic hernia without obstruction or gangrene: Secondary | ICD-10-CM | POA: Insufficient documentation

## 2015-11-20 DIAGNOSIS — Z90710 Acquired absence of both cervix and uterus: Secondary | ICD-10-CM | POA: Diagnosis not present

## 2015-11-20 DIAGNOSIS — F418 Other specified anxiety disorders: Secondary | ICD-10-CM | POA: Insufficient documentation

## 2015-11-20 DIAGNOSIS — E890 Postprocedural hypothyroidism: Secondary | ICD-10-CM | POA: Insufficient documentation

## 2015-11-20 DIAGNOSIS — Z951 Presence of aortocoronary bypass graft: Secondary | ICD-10-CM | POA: Insufficient documentation

## 2015-11-20 DIAGNOSIS — I252 Old myocardial infarction: Secondary | ICD-10-CM | POA: Diagnosis not present

## 2015-11-20 DIAGNOSIS — Z79899 Other long term (current) drug therapy: Secondary | ICD-10-CM | POA: Diagnosis not present

## 2015-11-20 DIAGNOSIS — Z881 Allergy status to other antibiotic agents status: Secondary | ICD-10-CM | POA: Diagnosis not present

## 2015-11-20 DIAGNOSIS — I10 Essential (primary) hypertension: Secondary | ICD-10-CM | POA: Diagnosis not present

## 2015-11-20 DIAGNOSIS — Z7902 Long term (current) use of antithrombotics/antiplatelets: Secondary | ICD-10-CM | POA: Insufficient documentation

## 2015-11-20 DIAGNOSIS — E785 Hyperlipidemia, unspecified: Secondary | ICD-10-CM | POA: Insufficient documentation

## 2015-11-20 DIAGNOSIS — K589 Irritable bowel syndrome without diarrhea: Secondary | ICD-10-CM | POA: Diagnosis not present

## 2015-11-20 DIAGNOSIS — K641 Second degree hemorrhoids: Secondary | ICD-10-CM | POA: Insufficient documentation

## 2015-11-20 DIAGNOSIS — K5790 Diverticulosis of intestine, part unspecified, without perforation or abscess without bleeding: Secondary | ICD-10-CM | POA: Diagnosis not present

## 2015-11-20 DIAGNOSIS — D649 Anemia, unspecified: Secondary | ICD-10-CM | POA: Insufficient documentation

## 2015-11-20 DIAGNOSIS — Z825 Family history of asthma and other chronic lower respiratory diseases: Secondary | ICD-10-CM | POA: Diagnosis not present

## 2015-11-20 DIAGNOSIS — I2510 Atherosclerotic heart disease of native coronary artery without angina pectoris: Secondary | ICD-10-CM | POA: Diagnosis not present

## 2015-11-20 DIAGNOSIS — H409 Unspecified glaucoma: Secondary | ICD-10-CM | POA: Insufficient documentation

## 2015-11-20 DIAGNOSIS — Z833 Family history of diabetes mellitus: Secondary | ICD-10-CM | POA: Diagnosis not present

## 2015-11-20 DIAGNOSIS — K5730 Diverticulosis of large intestine without perforation or abscess without bleeding: Secondary | ICD-10-CM | POA: Diagnosis not present

## 2015-11-20 DIAGNOSIS — Z7982 Long term (current) use of aspirin: Secondary | ICD-10-CM | POA: Diagnosis not present

## 2015-11-20 DIAGNOSIS — Z808 Family history of malignant neoplasm of other organs or systems: Secondary | ICD-10-CM | POA: Diagnosis not present

## 2015-11-20 DIAGNOSIS — M1990 Unspecified osteoarthritis, unspecified site: Secondary | ICD-10-CM | POA: Diagnosis not present

## 2015-11-20 DIAGNOSIS — E119 Type 2 diabetes mellitus without complications: Secondary | ICD-10-CM | POA: Diagnosis not present

## 2015-11-20 DIAGNOSIS — R51 Headache: Secondary | ICD-10-CM | POA: Diagnosis not present

## 2015-11-20 HISTORY — PX: COLONOSCOPY WITH PROPOFOL: SHX5780

## 2015-11-20 HISTORY — PX: ESOPHAGOGASTRODUODENOSCOPY (EGD) WITH PROPOFOL: SHX5813

## 2015-11-20 LAB — GLUCOSE, CAPILLARY: Glucose-Capillary: 104 mg/dL — ABNORMAL HIGH (ref 65–99)

## 2015-11-20 SURGERY — COLONOSCOPY WITH PROPOFOL
Anesthesia: General

## 2015-11-20 MED ORDER — FENTANYL CITRATE (PF) 100 MCG/2ML IJ SOLN
INTRAMUSCULAR | Status: DC | PRN
  Administered 2015-11-20: 50 ug via INTRAVENOUS

## 2015-11-20 MED ORDER — LIDOCAINE HCL (CARDIAC) 20 MG/ML IV SOLN
INTRAVENOUS | Status: DC | PRN
  Administered 2015-11-20: 60 mg via INTRAVENOUS

## 2015-11-20 MED ORDER — PROPOFOL 500 MG/50ML IV EMUL
INTRAVENOUS | Status: DC | PRN
  Administered 2015-11-20: 80 ug/kg/min via INTRAVENOUS

## 2015-11-20 MED ORDER — PROPOFOL 10 MG/ML IV BOLUS
INTRAVENOUS | Status: DC | PRN
Start: 2015-11-20 — End: 2015-11-20
  Administered 2015-11-20 (×2): 20 mg via INTRAVENOUS

## 2015-11-20 MED ORDER — SODIUM CHLORIDE 0.9 % IV SOLN
INTRAVENOUS | Status: DC
  Administered 2015-11-20: 10:00:00 via INTRAVENOUS
  Administered 2015-11-20: 1000 mL via INTRAVENOUS

## 2015-11-20 NOTE — Op Note (Signed)
[facility] Gastroenterology Patient Name: Angela Barrett Procedure Date: 11/20/2015 10:18 AM MRN: [id number] Account #: [id number] Date of Birth: 08-Oct-1940 Admit Type: Outpatient Age: 75 Room: [facility] ENDO ROOM 4 Gender: Female Note Status: Finalized Procedure:            Upper GI endoscopy Indications:          Dysphagia Providers:            Lucilla Lame, MD Referring MD:         Nanine Means. Reynaldo Minium, MD (Referring MD) Medicines:            Propofol per Anesthesia Complications:        No immediate complications. Procedure:            Pre-Anesthesia Assessment:                       - Prior to the procedure, a History and Physical was                        performed, and patient medications and allergies were                        reviewed. The patient's tolerance of previous                        anesthesia was also reviewed. The risks and benefits of                        the procedure and the sedation options and risks were                        discussed with the patient. All questions were                        answered, and informed consent was obtained. Prior                        Anticoagulants: The patient has taken no previous                        anticoagulant or antiplatelet agents. ASA Grade                        Assessment: II - A patient with mild systemic disease.                        After reviewing the risks and benefits, the patient was                        deemed in satisfactory condition to undergo the                        procedure.                       After obtaining informed consent, the endoscope was                        passed under direct vision. Throughout the procedure,  the patient's blood pressure, pulse, and oxygen                        saturations were monitored continuously. The Endoscope                        was introduced through the mouth, and advanced to the   second part of duodenum. The upper GI endoscopy was                        accomplished without difficulty. The patient tolerated                        the procedure well. Findings:      The examined esophagus was normal. The scope was withdrawn. Dilation was       performed with a Maloney dilator with moderate resistance at 54 Fr. The       dilation site was examined following endoscope reinsertion and showed no       change.      The stomach was normal.      The examined duodenum was normal. Impression:           - Normal esophagus. Dilated.                       - Normal stomach.                       - Normal examined duodenum.                       - No specimens collected. Recommendation:       - Perform a colonoscopy today. Procedure Code(s):    --- Professional ---                       [phone], Esophagogastroduodenoscopy, flexible, transoral;                        diagnostic, including collection of specimen(s) by                        brushing or washing, when performed (separate procedure)                       43450, Dilation of esophagus, by unguided sound or                        bougie, single or multiple passes Diagnosis Code(s):    --- Professional ---                       R13.10, Dysphagia, unspecified CPT copyright 2016 American Medical Association. All rights reserved. The codes documented in this report are preliminary and upon coder review may  be revised to meet current compliance requirements. Lucilla Lame, MD 11/20/2015 10:28:06 AM This report has been signed electronically. Number of Addenda: 0 Note Initiated On: 11/20/2015 10:18 AM      [facility]

## 2015-11-20 NOTE — Transfer of Care (Signed)
Immediate Anesthesia Transfer of Care Note  Patient: Angela Barrett  Procedure(s) Performed: Procedure(s): COLONOSCOPY WITH PROPOFOL (N/A) ESOPHAGOGASTRODUODENOSCOPY (EGD) WITH PROPOFOL (N/A)  Patient Location: PACU  Anesthesia Type:General  Level of Consciousness: awake, alert  and oriented  Airway & Oxygen Therapy: Patient Spontanous Breathing and Patient connected to nasal cannula oxygen  Post-op Assessment: Report given to RN and Post -op Vital signs reviewed and stable  Post vital signs: Reviewed and stable  Last Vitals:  Filed Vitals:   11/20/15 0910  BP: 141/73  Pulse: 81  Temp: 35.7 C  Resp: 17    Complications: No apparent anesthesia complications

## 2015-11-20 NOTE — Op Note (Signed)
[facility] Gastroenterology Patient Name: Angela Barrett Procedure Date: 11/20/2015 10:17 AM MRN: [id number] Account #: [id number] Date of Birth: 08-Feb-1941 Admit Type: Outpatient Age: 75 Room: [facility] ENDO ROOM 4 Gender: Female Note Status: Finalized Procedure:            Colonoscopy Indications:          Chronic diarrhea Providers:            Lucilla Lame, MD Referring MD:         Burnard Bunting (Referring MD) Medicines:            Propofol per Anesthesia Complications:        No immediate complications. Procedure:            Pre-Anesthesia Assessment:                       - Prior to the procedure, a History and Physical was                        performed, and patient medications and allergies were                        reviewed. The patient's tolerance of previous                        anesthesia was also reviewed. The risks and benefits of                        the procedure and the sedation options and risks were                        discussed with the patient. All questions were                        answered, and informed consent was obtained. Prior                        Anticoagulants: The patient has taken no previous                        anticoagulant or antiplatelet agents. ASA Grade                        Assessment: II - A patient with mild systemic disease.                        After reviewing the risks and benefits, the patient was                        deemed in satisfactory condition to undergo the                        procedure.                       After obtaining informed consent, the colonoscope was                        passed under direct vision. Throughout the procedure,  the patient's blood pressure, pulse, and oxygen                        saturations were monitored continuously. The                        Colonoscope was introduced through the anus and                        advanced to the the  terminal ileum. The colonoscopy was                        performed without difficulty. The patient tolerated the                        procedure well. The quality of the bowel preparation                        was adequate. Findings:      The perianal and digital rectal examinations were normal.      The terminal ileum appeared normal. Biopsies were taken with a cold       forceps for histology.      Multiple small-mouthed diverticula were found in the sigmoid colon.      Non-bleeding internal hemorrhoids were found during retroflexion. The       hemorrhoids were Grade II (internal hemorrhoids that prolapse but reduce       spontaneously).      Random biopsies were obtained with cold forceps for histology randomly       in the entire colon. Impression:           - The examined portion of the ileum was normal.                        Biopsied.                       - Diverticulosis in the sigmoid colon.                       - Non-bleeding internal hemorrhoids.                       - Random biopsies were obtained in the entire colon. Recommendation:       - Await pathology results. Procedure Code(s):    --- Professional ---                       [phone], Colonoscopy, flexible; with biopsy, single or                        multiple Diagnosis Code(s):    --- Professional ---                       K52.9, Noninfective gastroenteritis and colitis,                        unspecified CPT copyright 2016 American Medical Association. All rights reserved. The codes documented in this report are preliminary and upon coder review may  be revised to meet current compliance requirements. Lucilla Lame, MD 11/20/2015 10:40:03 AM This report has been signed electronically.  Number of Addenda: 0 Note Initiated On: 11/20/2015 10:17 AM Scope Withdrawal Time: 0 hours 5 minutes 51 seconds  Total Procedure Duration: 0 hours 8 minutes 46 seconds       [facility]

## 2015-11-20 NOTE — Anesthesia Preprocedure Evaluation (Signed)
Anesthesia Evaluation  Patient identified by MRN, date of birth, ID band Patient awake    Reviewed: Allergy & Precautions, NPO status , Patient's Chart, lab work & pertinent test results, reviewed documented beta blocker date and time   History of Anesthesia Complications (+) PONV  Airway Mallampati: II  TM Distance: >3 FB     Dental  (+) Chipped   Pulmonary           Cardiovascular hypertension, Pt. on medications and Pt. on home beta blockers + angina + CAD, + Past MI, + Cardiac Stents, + CABG and + Peripheral Vascular Disease       Neuro/Psych  Headaches, PSYCHIATRIC DISORDERS Anxiety Depression    GI/Hepatic GERD  ,  Endo/Other  diabetes, Type 2Hypothyroidism   Renal/GU      Musculoskeletal  (+) Arthritis ,   Abdominal   Peds  Hematology  (+) anemia ,   Anesthesia Other Findings   Reproductive/Obstetrics                             Anesthesia Physical Anesthesia Plan  ASA: III  Anesthesia Plan: General   Post-op Pain Management:    Induction: Intravenous  Airway Management Planned: Nasal Cannula  Additional Equipment:   Intra-op Plan:   Post-operative Plan:   Informed Consent: I have reviewed the patients History and Physical, chart, labs and discussed the procedure including the risks, benefits and alternatives for the proposed anesthesia with the patient or authorized representative who has indicated his/her understanding and acceptance.     Plan Discussed with: CRNA  Anesthesia Plan Comments:         Anesthesia Quick Evaluation

## 2015-11-20 NOTE — H&P (Signed)
[facility]  [address] [place], [place] [address] Phone: [phone] Fax : [phone]  Primary Care Physician:  Geoffery Lyons, MD Primary Gastroenterologist:  Dr. Allen Norris  Pre-Procedure History & Physical: HPI:  Angela Barrett is a 75 y.o. female is here for an endoscopy and colonoscopy.   Past Medical History  Diagnosis Date  . Depression   . Hypothyroidism   . GERD (gastroesophageal reflux disease)   . IBS (irritable bowel syndrome)   . Lymphocytosis 03/24/2012  . PONV (postoperative nausea and vomiting)   . Hyperlipidemia   . Coronary artery disease     a. nl myoview ~ 2006;  b. 01/2013 s/p MI and CABG x 4 (VG->D2, VG->RPDA->OM3, LIMA->LAD;  c. 05/2013 Cath/PCI: LM nl, LAD 60-70p/m, D1 70p, LCX 184m, OM2 60p, RCA 50-60p/34m (3.0x33 Xience DES), VG->D2 60, VG->RPDA/OM3 100, LIMA->LAD nl w 70 dLAD, EF 50%.  . Hypertension   . History of pneumonia 1985  . Diabetes mellitus, type 2 ([place])     "dx'd 1992" (05/25/2013)  . Anemia     "after OHS in 01/2013" (05/25/2013)  . [id number])     "monthly" (05/25/2013)  . OA (osteoarthritis)   . Arthritis     "joints" (05/25/2013)  . Anxiety   . Glaucoma     bilateral    Past Surgical History  Procedure Laterality Date  . Thyroidectomy  2010  . Coronary artery bypass graft N/A 01/07/2013    Procedure: CORONARY ARTERY BYPASS GRAFTING (CABG);  Surgeon: Melrose Nakayama, MD;  Location: [facility];  Service: Open Heart Surgery;  Laterality: N/A;  . Cardiac catheterization  01/2013  . Carpal tunnel release Bilateral 1990-1992  . Knee arthroscopy Bilateral 1993-1996  . Hammer toe surgery Right 2009  . Bladder suspension  2005  . Vaginal hysterectomy  1974  . Coronary angioplasty with stent placement  05/25/2013    "1" (05/25/2013)  . Left heart catheterization with coronary angiogram N/A 01/05/2013    Procedure: LEFT HEART CATHETERIZATION WITH CORONARY ANGIOGRAM;  Surgeon: Wellington Hampshire, MD;  Location: [place] [facility]  CATH LAB;  Service: Cardiovascular;  Laterality: N/A;  . Left heart catheterization with coronary/graft angiogram N/A 05/25/2013    Procedure: LEFT HEART CATHETERIZATION WITH Beatrix Fetters;  Surgeon: Wellington Hampshire, MD;  Location: [place] [facility] CATH LAB;  Service: Cardiovascular;  Laterality: N/A;  . Percutaneous coronary stent intervention (pci-s)  05/25/2013    Procedure: PERCUTANEOUS CORONARY STENT INTERVENTION (PCI-S);  Surgeon: Wellington Hampshire, MD;  Location: [facility] CATH LAB;  Service: Cardiovascular;;    Prior to Admission medications   Medication Sig Start Date End Date Taking? Authorizing Provider  acetaminophen (TYLENOL ARTHRITIS PAIN) 650 MG CR tablet Take 1,300 mg by mouth as needed.    Yes Historical Provider, MD  Artificial Tear Ointment (ARTIFICIAL TEARS) ointment Place 1 application into both eyes at bedtime.   Yes Historical Provider, MD  aspirin 81 MG tablet Take 1 tablet (81 mg total) by mouth daily. 05/26/13  Yes Rogelia Mire, NP  carvedilol (COREG) 3.125 MG tablet Take 1 tablet (3.125 mg total) by mouth 2 (two) times daily. 09/21/15  Yes Wellington Hampshire, MD  clopidogrel (PLAVIX) 75 MG tablet TAKE 1 TABLET (75 MG TOTAL) BY MOUTH DAILY WITH BREAKFAST. 09/14/15  Yes Wellington Hampshire, MD  insulin detemir (LEVEMIR) 100 UNIT/ML injection Inject 35 Units into the skin at bedtime.   Yes Historical Provider, MD  ketotifen (THERA TEARS ALLERGY) 0.025 % ophthalmic solution Place 1 drop into  both eyes as needed (for allergy eyes.).    Yes Historical Provider, MD  latanoprost (XALATAN) 0.005 % ophthalmic solution Place 1 drop into both eyes at bedtime.  10/11/13  Yes Historical Provider, MD  levothyroxine (SYNTHROID, LEVOTHROID) 88 MCG tablet Take 88 mcg by mouth daily before breakfast.   Yes Historical Provider, MD  lisinopril (PRINIVIL,ZESTRIL) 5 MG tablet TAKE 1 TABLET BY MOUTH DAILY 01/02/15  Yes Wellington Hampshire, MD  LORazepam (ATIVAN) 1 MG tablet Take 2.5 mg by mouth at bedtime.    Yes Historical Provider, MD  metFORMIN (GLUCOPHAGE-XR) 500 MG 24 hr tablet Take 1 tablet (500 mg total) by mouth daily with breakfast. Patient taking differently: Take 1,000 mg by mouth 2 (two) times daily.  05/26/13  Yes Rogelia Mire, NP  nitroGLYCERIN (NITROSTAT) 0.4 MG SL tablet Place 1 tablet (0.4 mg total) under the tongue every 5 (five) minutes as needed for chest pain. 05/26/13  Yes Rogelia Mire, NP  ONETOUCH DELICA LANCETS [id number] [place]  10/13/15  Yes Historical Provider, MD  pantoprazole (PROTONIX) 40 MG tablet Take 40 mg by mouth as needed.    Yes Historical Provider, MD  ranolazine (RANEXA) 1000 MG SR tablet TAKE 1 TABLET (1,000 MG TOTAL) BY MOUTH 2 (TWO) TIMES DAILY. 06/06/15  Yes Wellington Hampshire, MD  rosuvastatin (CRESTOR) 5 MG tablet TAKE 1 TABLET (5 MG TOTAL) BY MOUTH DAILY. Patient taking differently: TAKE 1 TABLET (5 MG TOTAL) BY MOUTH EVERY OTHER DAY. 06/07/15  Yes Wellington Hampshire, MD  metoprolol tartrate (LOPRESSOR) 25 MG tablet Take 25 mg by mouth 2 (two) times daily. Reported on 11/20/2015 09/03/15   Historical Provider, MD    Allergies as of 10/16/2015 - Review Complete 10/16/2015  Allergen Reaction Noted  . Amitriptyline Other (See Comments) 01/05/2013  . Cortisone Other (See Comments) 01/05/2013  . Lipitor [atorvastatin]  05/26/2013  . Macrodantin [nitrofurantoin] Itching and Swelling 01/05/2013  . Motrin [ibuprofen] Other (See Comments) 01/05/2013    Family History  Problem Relation Age of Onset  . Cancer Mother     bone CA, died @ 34  . Emphysema Father     died @ 4  . Emphysema Brother     died @ 39  . Diabetes Son     alive and well.    Social History   Social History  . Marital Status: Married    Spouse Name: N/A  . Number of Children: N/A  . Years of Education: N/A   Occupational History  . Not on file.   Social History Main Topics  . Smoking status: Never Smoker   . Smokeless tobacco: Never Used  . Alcohol Use: No  . Drug Use: No    . Sexual Activity: Not Currently   Other Topics Concern  . Not on file   Social History Narrative   Lives in [place] with husband.    Review of Systems: See HPI, otherwise negative ROS  Physical Exam: BP 141/73 mmHg  Pulse 81  Temp(Src) 96.2 F (35.7 C) (Tympanic)  Resp 17  Ht 5\' 3"  (1.6 m)  Wt 137 lb (62.143 kg)  BMI 24.27 kg/m2  SpO2 100% General:   Alert,  pleasant and cooperative in NAD Head:  Normocephalic and atraumatic. Neck:  Supple; no masses or thyromegaly. Lungs:  Clear throughout to auscultation.    Heart:  Regular rate and rhythm. Abdomen:  Soft, nontender and nondistended. Normal bowel sounds, without guarding, and without rebound.   Neurologic:  Alert  and  oriented x4;  grossly normal neurologically.  Impression/Plan: Angela Barrett is here for an endoscopy and colonoscopy to be performed for diarrhea and dysphagia  Risks, benefits, limitations, and alternatives regarding  endoscopy and colonoscopy have been reviewed with the patient.  Questions have been answered.  All parties agreeable.   Ollen Bowl, MD  11/20/2015, 10:09 AM

## 2015-11-20 NOTE — Anesthesia Postprocedure Evaluation (Signed)
Anesthesia Post Note  Patient: Angela Barrett  Procedure(s) Performed: Procedure(s) (LRB): COLONOSCOPY WITH PROPOFOL (N/A) ESOPHAGOGASTRODUODENOSCOPY (EGD) WITH PROPOFOL (N/A)  Patient location during evaluation: Endoscopy Anesthesia Type: General Level of consciousness: awake and alert Pain management: pain level controlled Vital Signs Assessment: post-procedure vital signs reviewed and stable Respiratory status: spontaneous breathing, nonlabored ventilation, respiratory function stable and patient connected to nasal cannula oxygen Cardiovascular status: blood pressure returned to baseline and stable Postop Assessment: no signs of nausea or vomiting Anesthetic complications: no    Last Vitals:  Filed Vitals:   11/20/15 1100 11/20/15 1110  BP: 124/61 128/66  Pulse:    Temp:    Resp:      Last Pain:  Filed Vitals:   11/20/15 1118  PainSc: 0-No pain                 Ahuva Poynor S

## 2015-11-21 ENCOUNTER — Encounter: Payer: Self-pay | Admitting: Gastroenterology

## 2015-11-22 LAB — SURGICAL PATHOLOGY

## 2015-11-26 ENCOUNTER — Telehealth: Payer: Self-pay

## 2015-11-26 ENCOUNTER — Other Ambulatory Visit: Payer: Self-pay

## 2015-11-26 DIAGNOSIS — R197 Diarrhea, unspecified: Secondary | ICD-10-CM

## 2015-11-26 NOTE — Telephone Encounter (Signed)
-----   Message from Lucilla Lame, MD sent at 11/26/2015 11:33 AM EDT ----- That the patient know that all the biopsies of her small bowel esophagus and colon were all normal without any inflammation or cause for her symptoms. If the patient is still having symptoms please send off a celiac sprue panel and have her come into the office for follow-up.

## 2015-11-26 NOTE — Telephone Encounter (Signed)
Pt notified of procedure results. Pt still having diarrhea so I have added the celiac sprue test. Pt will go this week.

## 2015-11-28 DIAGNOSIS — R197 Diarrhea, unspecified: Secondary | ICD-10-CM | POA: Diagnosis not present

## 2015-11-30 ENCOUNTER — Telehealth: Payer: Self-pay

## 2015-11-30 NOTE — Telephone Encounter (Signed)
-----   Message from Lucilla Lame, MD sent at 11/29/2015  8:46 PM EDT ----- Let the patient now that the Blood test for celiac sprue was negative.

## 2015-11-30 NOTE — Telephone Encounter (Signed)
Pt notified of lab results. Pt scheduled for a follow up with [facility] on Tuesday, May 2nd.

## 2015-12-03 LAB — GLIA (IGA/G) + TTG IGA
Antigliadin Abs, IgA: 4 units (ref 0–19)
GLIADIN IGG: 2 U (ref 0–19)
Transglutaminase IgA: <2 U/mL (ref 0–3)

## 2015-12-04 ENCOUNTER — Other Ambulatory Visit: Payer: Self-pay

## 2015-12-04 ENCOUNTER — Ambulatory Visit (INDEPENDENT_AMBULATORY_CARE_PROVIDER_SITE_OTHER): Payer: Medicare Other | Admitting: Gastroenterology

## 2015-12-04 ENCOUNTER — Encounter: Payer: Self-pay | Admitting: Gastroenterology

## 2015-12-04 ENCOUNTER — Telehealth: Payer: Self-pay

## 2015-12-04 VITALS — BP 124/64 | HR 78 | Temp 98.1°F | Ht 64.0 in | Wt 142.4 lb

## 2015-12-04 DIAGNOSIS — K580 Irritable bowel syndrome with diarrhea: Secondary | ICD-10-CM | POA: Diagnosis not present

## 2015-12-04 NOTE — Telephone Encounter (Signed)
Pt notified of lab result. Pt still having diarrhea. Pt scheduled for a follow up appt today (12/04/15)

## 2015-12-04 NOTE — Telephone Encounter (Signed)
-----   Message from Lucilla Lame, MD sent at 12/03/2015  1:14 PM EDT ----- Let the patient know that the test for celiac sprue was negative

## 2015-12-04 NOTE — Progress Notes (Signed)
Primary Care Physician: Geoffery Lyons, MD  Primary Gastroenterologist:  Dr. Lucilla Lame  Chief Complaint  Patient presents with  . Follow up Colonoscopy results  . Diarrhea    HPI: Angela Barrett is a 75 y.o. female here For follow-up after having an EGD and colonoscopy. The patient continues to have diarrhea. The patient's esophagus was stretched at the time of the endoscopy and she states that she is not having any problems with swallowing at the present time. The patient's biopsies of her small bowel from the upper endoscopy and the terminal ileum were normal as well as her random colon biopsies throughout her colon. The patient states that she will take Imodium and not have any bowel movements for 3 days. The patient then states that she has passage of stools both liquids and solids from her rectum.  Current Outpatient Prescriptions  Medication Sig Dispense Refill  . acetaminophen (TYLENOL ARTHRITIS PAIN) 650 MG CR tablet Take 1,300 mg by mouth as needed.     . Artificial Tear Ointment (ARTIFICIAL TEARS) ointment Place 1 application into both eyes at bedtime.    Marland Kitchen aspirin 81 MG tablet Take 1 tablet (81 mg total) by mouth daily.    . B-D INS SYRINGE 0.5CC/31GX5/16 31G X 5/16" 0.5 ML MISC daily. as directed  5  . B-D ULTRAFINE III SHORT PEN 31G X 8 MM MISC daily. as directed  5  . carvedilol (COREG) 3.125 MG tablet Take 1 tablet (3.125 mg total) by mouth 2 (two) times daily. 180 tablet 1  . clopidogrel (PLAVIX) 75 MG tablet TAKE 1 TABLET (75 MG TOTAL) BY MOUTH DAILY WITH BREAKFAST. 90 tablet 3  . insulin detemir (LEVEMIR) 100 UNIT/ML injection Inject 35 Units into the skin at bedtime.    . Insulin Glargine (BASAGLAR KWIKPEN) 100 UNIT/ML SOPN INJECT 35 UNITS SUBCUTANEOUSLY AT BEDTIME AS NEEDED  3  . ipratropium (ATROVENT) 0.03 % nasal spray USE 2 SPRAYS IN EACH NOSTRIL 3 TIMES DAILY AS NEEDED FOR DRAINAGE  11  . ketotifen (THERA TEARS ALLERGY) 0.025 % ophthalmic solution Place 1 drop  into both eyes as needed (for allergy eyes.).     Marland Kitchen latanoprost (XALATAN) 0.005 % ophthalmic solution Place 1 drop into both eyes at bedtime.     Marland Kitchen levothyroxine (SYNTHROID, LEVOTHROID) 88 MCG tablet Take 88 mcg by mouth daily before breakfast.    . lisinopril (PRINIVIL,ZESTRIL) 5 MG tablet TAKE 1 TABLET BY MOUTH DAILY 90 tablet 3  . LORazepam (ATIVAN) 1 MG tablet Take 2.5 mg by mouth at bedtime.    . metFORMIN (GLUCOPHAGE-XR) 500 MG 24 hr tablet Take 1 tablet (500 mg total) by mouth daily with breakfast. (Patient taking differently: Take 1,000 mg by mouth 2 (two) times daily. )    . metoprolol tartrate (LOPRESSOR) 25 MG tablet Take 25 mg by mouth 2 (two) times daily. Reported on 11/20/2015  3  . nitroGLYCERIN (NITROSTAT) 0.4 MG SL tablet Place 1 tablet (0.4 mg total) under the tongue every 5 (five) minutes as needed for chest pain. 25 tablet 3  . ONE TOUCH ULTRA TEST test strip USE AS DIRECTED TO TEST BLOOD SUGAR 3 TIMES A DAY DX E11.51  3  . ONETOUCH DELICA LANCETS [id number] MISC     . pantoprazole (PROTONIX) 40 MG tablet Take 40 mg by mouth as needed.     . ranolazine (RANEXA) 1000 MG SR tablet TAKE 1 TABLET (1,000 MG TOTAL) BY MOUTH 2 (TWO) TIMES DAILY. 180 tablet 2  .  rosuvastatin (CRESTOR) 5 MG tablet TAKE 1 TABLET (5 MG TOTAL) BY MOUTH DAILY. (Patient taking differently: TAKE 1 TABLET (5 MG TOTAL) BY MOUTH EVERY OTHER DAY.) 90 tablet 2   No current facility-administered medications for this visit.    Allergies as of 12/04/2015 - Review Complete 12/04/2015  Allergen Reaction Noted  . Amitriptyline Other (See Comments) 01/05/2013  . Cortisone Other (See Comments) 01/05/2013  . Lipitor [atorvastatin]  05/26/2013  . Macrodantin [nitrofurantoin] Itching and Swelling 01/05/2013  . Motrin [ibuprofen] Other (See Comments) 01/05/2013    ROS:  General: Negative for anorexia, weight loss, fever, chills, fatigue, weakness. ENT: Negative for hoarseness, difficulty swallowing , nasal congestion. CV:  Negative for chest pain, angina, palpitations, dyspnea on exertion, peripheral edema.  Respiratory: Negative for dyspnea at rest, dyspnea on exertion, cough, sputum, wheezing.  GI: See history of present illness. GU:  Negative for dysuria, hematuria, urinary incontinence, urinary frequency, nocturnal urination.  Endo: Negative for unusual weight change.    Physical Examination:   BP 124/64 mmHg  Pulse 78  Temp(Src) 98.1 F (36.7 C) (Oral)  Ht 5\' 4"  (1.626 m)  Wt 142 lb 6.4 oz (64.592 kg)  BMI 24.43 kg/m2  General: Well-nourished, well-developed in no acute distress.  Eyes: No icterus. Conjunctivae pink. Neuro: Alert and oriented x 3.  Grossly intact. Skin: Warm and dry, no jaundice.   Psych: Alert and cooperative, normal mood and affect.  Labs:    Imaging Studies: No results found.  Assessment and Plan:   Angela Barrett is a 75 y.o. y/o female who comes in with a history of irritable bowel syndrome and has been having diarrhea. The patient states she eats a lot of cheese. The patient will switch over to liquid Imodium and titrated to 1 bowel movement every day to every other day. The patient will also try to avoid cheese products. The patient has been told that all of her biopsies were normal and no cause for her diarrhea was seen. The patient has also been told to increase fiber in her diet to decrease her diarrhea. The patient has been explained the plan and agrees with it.   Note: This dictation was prepared with Dragon dictation along with smaller phrase technology. Any transcriptional errors that result from this process are unintentional.

## 2016-01-02 DIAGNOSIS — H401132 Primary open-angle glaucoma, bilateral, moderate stage: Secondary | ICD-10-CM | POA: Diagnosis not present

## 2016-02-08 ENCOUNTER — Other Ambulatory Visit: Payer: Self-pay | Admitting: Cardiovascular Disease

## 2016-02-25 ENCOUNTER — Other Ambulatory Visit: Payer: Self-pay | Admitting: Cardiovascular Disease

## 2016-02-25 ENCOUNTER — Other Ambulatory Visit: Payer: Self-pay

## 2016-02-25 MED ORDER — LISINOPRIL 5 MG PO TABS: 5.0000 mg | ORAL_TABLET | Freq: Every day | ORAL | 3 refills | Status: DC

## 2016-03-12 ENCOUNTER — Other Ambulatory Visit: Payer: Self-pay | Admitting: Cardiovascular Disease

## 2016-03-27 ENCOUNTER — Encounter: Payer: Self-pay | Admitting: Cardiovascular Disease

## 2016-03-27 ENCOUNTER — Ambulatory Visit (INDEPENDENT_AMBULATORY_CARE_PROVIDER_SITE_OTHER): Payer: Medicare Other | Admitting: Cardiovascular Disease

## 2016-03-27 VITALS — BP 100/54 | HR 71 | Ht 64.0 in | Wt 137.2 lb

## 2016-03-27 DIAGNOSIS — I10 Essential (primary) hypertension: Secondary | ICD-10-CM

## 2016-03-27 DIAGNOSIS — E785 Hyperlipidemia, unspecified: Secondary | ICD-10-CM

## 2016-03-27 DIAGNOSIS — I25118 Atherosclerotic heart disease of native coronary artery with other forms of angina pectoris: Secondary | ICD-10-CM

## 2016-03-27 NOTE — Progress Notes (Signed)
Cardiology Office Note   Date:  03/27/2016   ID:  Angela, Barrett Dec 27, 1940, MRN [id number]  PCP:  Geoffery Lyons, MD  Cardiologist:   Kathlyn Sacramento, MD   Chief Complaint  Patient presents with  . Other    6 month f/u c/o chest pain w/bilateral arm pain.  Pt took 3 nitro. Sunday. Meds reviewed verbally with pt.      History of Present Illness: Angela Barrett is a 75 y.o. female who presents for a followup regarding coronary artery disease status post CABG and PCI.  She has known history of diabetes and hypertension. She had CABG in June, 2014 after NSTEMI.  She had recurrent angina post CABG. Cardiac cath in 05/2013 showed significant 3 vessel CAD with diffuse diabetic vessels. SVG to RPDA/OM3 was found to be occluded. OM3 got collaterals from RCA. EF was low normal. I performed Successful PCI and DES placement to proximal and mid right coronary artery. Left circumflex occlusion was left to be treated medically.  She continued to have significant angina even after PCI which improved with  Ranexa with . She did not tolerate statins in the past due to severe myalgia but was ultimately able to tolerate small dose Crestor every other day. Most recent nuclear stress test in January 2016 was normal. Most recent echocardiogram in August 2016 showed normal LV systolic function, mild to moderate mitral regurgitation and mild pulmonary hypertension. Holter monitor was done for palpitations and showed no significant arrhythmia. She complained of some atypical leg pain. ABIs was close to normal bilaterally with moderate nonobstructive disease on duplex. She has been seen by GI for irritable bowel syndrome. She did have prolonged episodes of chest pain over the weekend which did not respond well to nitroglycerin. She is overall feeling better now.   Past Medical History:  Diagnosis Date  . Anemia    "after OHS in 01/2013" (05/25/2013)  . Anxiety   . Arthritis    "joints" (05/25/2013)    . Coronary artery disease    a. nl myoview ~ 2006;  b. 01/2013 s/p MI and CABG x 4 (VG->D2, VG->RPDA->OM3, LIMA->LAD;  c. 05/2013 Cath/PCI: LM nl, LAD 60-70p/m, D1 70p, LCX 147m, OM2 60p, RCA 50-60p/64m (3.0x33 Xience DES), VG->D2 60, VG->RPDA/OM3 100, LIMA->LAD nl w 70 dLAD, EF 50%.  . Depression   . Diabetes mellitus, type 2 ([place])    "dx'd 1992" (05/25/2013)  . GERD (gastroesophageal reflux disease)   . Glaucoma    bilateral  . Headache(784.0)    "monthly" (05/25/2013)  . History of pneumonia 1985  . Hyperlipidemia   . Hypertension   . Hypothyroidism   . IBS (irritable bowel syndrome)   . Lymphocytosis 03/24/2012  . OA (osteoarthritis)   . PONV (postoperative nausea and vomiting)     Past Surgical History:  Procedure Laterality Date  . BLADDER SUSPENSION  2005  . CARDIAC CATHETERIZATION  01/2013  . CARPAL TUNNEL RELEASE Bilateral 1990-1992  . COLONOSCOPY WITH PROPOFOL N/A 11/20/2015   Procedure: COLONOSCOPY WITH PROPOFOL;  Surgeon: Lucilla Lame, MD;  Location: [facility] ENDOSCOPY;  Service: Endoscopy;  Laterality: N/A;  . CORONARY ANGIOPLASTY WITH STENT PLACEMENT  05/25/2013   "1" (05/25/2013)  . CORONARY ARTERY BYPASS GRAFT N/A 01/07/2013   Procedure: CORONARY ARTERY BYPASS GRAFTING (CABG);  Surgeon: Melrose Nakayama, MD;  Location: [facility];  Service: Open Heart Surgery;  Laterality: N/A;  . ESOPHAGOGASTRODUODENOSCOPY (EGD) WITH PROPOFOL N/A 11/20/2015   Procedure: ESOPHAGOGASTRODUODENOSCOPY (EGD) WITH PROPOFOL;  Surgeon: Lucilla Lame, MD;  Location: [facility] ENDOSCOPY;  Service: Endoscopy;  Laterality: N/A;  . HAMMER TOE SURGERY Right 2009  . KNEE ARTHROSCOPY Bilateral 1993-1996  . LEFT HEART CATHETERIZATION WITH CORONARY ANGIOGRAM N/A 01/05/2013   Procedure: LEFT HEART CATHETERIZATION WITH CORONARY ANGIOGRAM;  Surgeon: Wellington Hampshire, MD;  Location: [facility] CATH LAB;  Service: Cardiovascular;  Laterality: N/A;  . LEFT HEART CATHETERIZATION WITH CORONARY/GRAFT ANGIOGRAM N/A 05/25/2013    Procedure: LEFT HEART CATHETERIZATION WITH Beatrix Fetters;  Surgeon: Wellington Hampshire, MD;  Location: [facility] CATH LAB;  Service: Cardiovascular;  Laterality: N/A;  . PERCUTANEOUS CORONARY STENT INTERVENTION (PCI-S)  05/25/2013   Procedure: PERCUTANEOUS CORONARY STENT INTERVENTION (PCI-S);  Surgeon: Wellington Hampshire, MD;  Location: [facility] CATH LAB;  Service: Cardiovascular;;  . THYROIDECTOMY  2010  . VAGINAL HYSTERECTOMY  1974     Current Outpatient Prescriptions  Medication Sig Dispense Refill  . acetaminophen (TYLENOL ARTHRITIS PAIN) 650 MG CR tablet Take 1,300 mg by mouth as needed.     . Artificial Tear Ointment (ARTIFICIAL TEARS) ointment Place 1 application into both eyes at bedtime.    Marland Kitchen aspirin 81 MG tablet Take 1 tablet (81 mg total) by mouth daily.    . B-D INS SYRINGE 0.5CC/31GX5/16 31G X 5/16" 0.5 ML MISC daily. as directed  5  . B-D ULTRAFINE III SHORT PEN 31G X 8 MM MISC daily. as directed  5  . carvedilol (COREG) 3.125 MG tablet TAKE 1 TABLET (3.125 MG TOTAL) BY MOUTH 2 (TWO) TIMES DAILY. 180 tablet 3  . insulin detemir (LEVEMIR) 100 UNIT/ML injection Inject 30 Units into the skin at bedtime.     Marland Kitchen ipratropium (ATROVENT) 0.03 % nasal spray USE 2 SPRAYS IN EACH NOSTRIL 3 TIMES DAILY AS NEEDED FOR DRAINAGE  11  . ketotifen (THERA TEARS ALLERGY) 0.025 % ophthalmic solution Place 1 drop into both eyes as needed (for allergy eyes.).     Marland Kitchen latanoprost (XALATAN) 0.005 % ophthalmic solution Place 1 drop into both eyes at bedtime.     Marland Kitchen levothyroxine (SYNTHROID, LEVOTHROID) 88 MCG tablet Take 88 mcg by mouth daily before breakfast.    . lisinopril (PRINIVIL,ZESTRIL) 5 MG tablet Take 1 tablet (5 mg total) by mouth daily. 90 tablet 3  . LORazepam (ATIVAN) 1 MG tablet Take 2.5 mg by mouth at bedtime.    . metFORMIN (GLUCOPHAGE-XR) 500 MG 24 hr tablet Take 1 tablet (500 mg total) by mouth daily with breakfast. (Patient taking differently: Take 1,000 mg by mouth 2 (two) times daily. )    .  nitroGLYCERIN (NITROSTAT) 0.4 MG SL tablet Place 1 tablet (0.4 mg total) under the tongue every 5 (five) minutes as needed for chest pain. 25 tablet 3  . ONE TOUCH ULTRA TEST test strip USE AS DIRECTED TO TEST BLOOD SUGAR 3 TIMES A DAY DX E11.51  3  . ONETOUCH DELICA LANCETS 99991111 MISC     . pantoprazole (PROTONIX) 40 MG tablet Take 40 mg by mouth as needed.     Marland Kitchen RANEXA 1000 MG SR tablet TAKE 1 TABLET (1,000 MG TOTAL) BY MOUTH 2 (TWO) TIMES DAILY. 180 tablet 3  . rosuvastatin (CRESTOR) 5 MG tablet TAKE 1 TABLET BY MOUTH EVERY DAY 90 tablet 3   No current facility-administered medications for this visit.     Allergies:   Amitriptyline; Cortisone; Lipitor [atorvastatin]; Macrodantin [nitrofurantoin]; and Motrin [ibuprofen]    Social History:  The patient  reports that she has never smoked. She has never  used smokeless tobacco. She reports that she does not drink alcohol or use drugs.   Family History:  The patient's family history includes Cancer in her mother; Diabetes in her son; Emphysema in her brother and father.    ROS:  Please see the history of present illness.   Otherwise, review of systems are positive for none.   All other systems are reviewed and negative.    PHYSICAL EXAM: VS:  BP (!) 100/54 (BP Location: Left Arm, Patient Position: Sitting, Cuff Size: Normal)   Pulse 71   Ht 5\' 4"  (1.626 m)   Wt 137 lb 4 oz (62.3 kg)   BMI 23.56 kg/m  , BMI Body mass index is 23.56 kg/m. GEN: Well nourished, well developed, in no acute distress  HEENT: normal  Neck: no JVD, carotid bruits, or masses Cardiac: RRR; no murmurs, rubs, or gallops,no edema  Respiratory:  clear to auscultation bilaterally, normal work of breathing GI: soft, nontender, nondistended, + BS MS: no deformity or atrophy  Skin: warm and dry, no rash Neuro:  Strength and sensation are intact Psych: euthymic mood, full affect   EKG:  EKG is ordered today. The ekg ordered today demonstrates Normal sinus rhythm  with left axis deviation. No significant ST or T wave changes   Recent Labs: 06/22/2015: Creatinine, Ser 1.41    Lipid Panel    Component Value Date/Time   CHOL 164 06/24/2013 1031   TRIG 143 06/24/2013 1031   HDL 49 06/24/2013 1031   CHOLHDL 3.3 06/24/2013 1031   CHOLHDL 6.2 01/06/2013 0245   VLDL 28 01/06/2013 0245   LDLCALC 86 06/24/2013 1031      Wt Readings from Last 3 Encounters:  03/27/16 137 lb 4 oz (62.3 kg)  12/04/15 142 lb 6.4 oz (64.6 kg)  11/20/15 137 lb (62.1 kg)        ASSESSMENT AND PLAN:  1.  Coronary artery disease involving bypass graft with stable angina: Symptoms are reasonably controlled with medications. PCI was almost 3 years ago and given her GI symptoms, I elected to discontinue Plavix. She is going to monitor her symptoms and of the chest pain becomes more frequent, I will consider repeating her stress test. Bradycardia improved after switching metoprolol to carvedilol.   2. Essential hypertension: Blood pressure is controlled on current medications.  3. Hyperlipidemia with intolerance to statins. She has been able to tolerate small dose rosuvastatin every other day.    Disposition:   FU with me in 6 months  Signed,  Kathlyn Sacramento, MD  03/27/2016 1:58 PM    [place]

## 2016-03-27 NOTE — Patient Instructions (Signed)
Medication Instructions:  Your physician has recommended you make the following change in your medication:  STOP taking Plavix   Labwork: none  Testing/Procedures: none  Follow-Up: Your physician wants you to follow-up in: six months with Dr. Arida.  You will receive a reminder letter in the mail two months in advance. If you don't receive a letter, please call our office to schedule the follow-up appointment.   Any Other Special Instructions Will Be Listed Below (If Applicable).     If you need a refill on your cardiac medications before your next appointment, please call your pharmacy.   

## 2016-04-11 DIAGNOSIS — N183 Chronic kidney disease, stage 3 (moderate): Secondary | ICD-10-CM | POA: Diagnosis not present

## 2016-04-11 DIAGNOSIS — E1129 Type 2 diabetes mellitus with other diabetic kidney complication: Secondary | ICD-10-CM | POA: Diagnosis not present

## 2016-04-11 DIAGNOSIS — E890 Postprocedural hypothyroidism: Secondary | ICD-10-CM | POA: Diagnosis not present

## 2016-04-11 DIAGNOSIS — I700 Atherosclerosis of aorta: Secondary | ICD-10-CM | POA: Diagnosis not present

## 2016-04-16 DIAGNOSIS — Z803 Family history of malignant neoplasm of breast: Secondary | ICD-10-CM | POA: Diagnosis not present

## 2016-04-16 DIAGNOSIS — Z1231 Encounter for screening mammogram for malignant neoplasm of breast: Secondary | ICD-10-CM | POA: Diagnosis not present

## 2016-04-18 DIAGNOSIS — E1129 Type 2 diabetes mellitus with other diabetic kidney complication: Secondary | ICD-10-CM | POA: Diagnosis not present

## 2016-04-18 DIAGNOSIS — K219 Gastro-esophageal reflux disease without esophagitis: Secondary | ICD-10-CM | POA: Diagnosis not present

## 2016-04-18 DIAGNOSIS — I700 Atherosclerosis of aorta: Secondary | ICD-10-CM | POA: Diagnosis not present

## 2016-04-18 DIAGNOSIS — F329 Major depressive disorder, single episode, unspecified: Secondary | ICD-10-CM | POA: Diagnosis not present

## 2016-04-18 DIAGNOSIS — Z0000 Encounter for general adult medical examination without abnormal findings: Secondary | ICD-10-CM | POA: Diagnosis not present

## 2016-04-18 DIAGNOSIS — E890 Postprocedural hypothyroidism: Secondary | ICD-10-CM | POA: Diagnosis not present

## 2016-04-18 DIAGNOSIS — E1151 Type 2 diabetes mellitus with diabetic peripheral angiopathy without gangrene: Secondary | ICD-10-CM | POA: Diagnosis not present

## 2016-04-18 DIAGNOSIS — I129 Hypertensive chronic kidney disease with stage 1 through stage 4 chronic kidney disease, or unspecified chronic kidney disease: Secondary | ICD-10-CM | POA: Diagnosis not present

## 2016-04-18 DIAGNOSIS — I2510 Atherosclerotic heart disease of native coronary artery without angina pectoris: Secondary | ICD-10-CM | POA: Diagnosis not present

## 2016-04-18 DIAGNOSIS — I209 Angina pectoris, unspecified: Secondary | ICD-10-CM | POA: Diagnosis not present

## 2016-04-18 DIAGNOSIS — N183 Chronic kidney disease, stage 3 (moderate): Secondary | ICD-10-CM | POA: Diagnosis not present

## 2016-04-18 DIAGNOSIS — Z6825 Body mass index (BMI) 25.0-25.9, adult: Secondary | ICD-10-CM | POA: Diagnosis not present

## 2016-06-02 DIAGNOSIS — H401132 Primary open-angle glaucoma, bilateral, moderate stage: Secondary | ICD-10-CM | POA: Diagnosis not present

## 2016-07-02 DIAGNOSIS — H401132 Primary open-angle glaucoma, bilateral, moderate stage: Secondary | ICD-10-CM | POA: Diagnosis not present

## 2016-09-10 DIAGNOSIS — Z6825 Body mass index (BMI) 25.0-25.9, adult: Secondary | ICD-10-CM | POA: Diagnosis not present

## 2016-09-10 DIAGNOSIS — I700 Atherosclerosis of aorta: Secondary | ICD-10-CM | POA: Diagnosis not present

## 2016-09-10 DIAGNOSIS — E890 Postprocedural hypothyroidism: Secondary | ICD-10-CM | POA: Diagnosis not present

## 2016-09-10 DIAGNOSIS — E1151 Type 2 diabetes mellitus with diabetic peripheral angiopathy without gangrene: Secondary | ICD-10-CM | POA: Diagnosis not present

## 2016-09-10 DIAGNOSIS — N183 Chronic kidney disease, stage 3 (moderate): Secondary | ICD-10-CM | POA: Diagnosis not present

## 2016-09-10 DIAGNOSIS — F329 Major depressive disorder, single episode, unspecified: Secondary | ICD-10-CM | POA: Diagnosis not present

## 2016-09-10 DIAGNOSIS — Z1389 Encounter for screening for other disorder: Secondary | ICD-10-CM | POA: Diagnosis not present

## 2016-09-10 DIAGNOSIS — I2510 Atherosclerotic heart disease of native coronary artery without angina pectoris: Secondary | ICD-10-CM | POA: Diagnosis not present

## 2016-09-10 DIAGNOSIS — E042 Nontoxic multinodular goiter: Secondary | ICD-10-CM | POA: Diagnosis not present

## 2016-09-10 DIAGNOSIS — I209 Angina pectoris, unspecified: Secondary | ICD-10-CM | POA: Diagnosis not present

## 2016-09-10 DIAGNOSIS — E1129 Type 2 diabetes mellitus with other diabetic kidney complication: Secondary | ICD-10-CM | POA: Diagnosis not present

## 2016-09-10 DIAGNOSIS — I129 Hypertensive chronic kidney disease with stage 1 through stage 4 chronic kidney disease, or unspecified chronic kidney disease: Secondary | ICD-10-CM | POA: Diagnosis not present

## 2016-09-25 ENCOUNTER — Encounter: Payer: Self-pay | Admitting: Cardiovascular Disease

## 2016-09-25 ENCOUNTER — Ambulatory Visit (INDEPENDENT_AMBULATORY_CARE_PROVIDER_SITE_OTHER): Payer: Medicare Other | Admitting: Cardiovascular Disease

## 2016-09-25 VITALS — BP 112/54 | HR 54 | Ht 63.0 in | Wt 137.8 lb

## 2016-09-25 DIAGNOSIS — I25709 Atherosclerosis of coronary artery bypass graft(s), unspecified, with unspecified angina pectoris: Secondary | ICD-10-CM

## 2016-09-25 DIAGNOSIS — E785 Hyperlipidemia, unspecified: Secondary | ICD-10-CM | POA: Diagnosis not present

## 2016-09-25 DIAGNOSIS — I209 Angina pectoris, unspecified: Secondary | ICD-10-CM

## 2016-09-25 DIAGNOSIS — I10 Essential (primary) hypertension: Secondary | ICD-10-CM

## 2016-09-25 MED ORDER — NITROGLYCERIN 0.4 MG SL SUBL: 0.4000 mg | SUBLINGUAL_TABLET | SUBLINGUAL | 6 refills | Status: DC | PRN

## 2016-09-25 NOTE — Progress Notes (Signed)
Cardiology Office Note   Date:  09/25/2016   ID:  Angela, Barrett [birth year], MRN [id number]  PCP:  Geoffery Lyons, MD  Cardiologist:   Kathlyn Sacramento, MD   Chief Complaint  Patient presents with  . other    6 month f/u c/o chest discomfort, sob , fatigue and feeling wobbly.  Meds reviewed verbally with pt.      History of Present Illness: Angela Barrett is a 76 y.o. female who presents for a followup regarding coronary artery disease status post CABG and PCI.  She has known history of diabetes and hypertension. She had CABG in June, 2014 after NSTEMI.  She had recurrent angina post CABG. Cardiac cath in 05/2013 showed significant 3 vessel CAD with diffuse diabetic vessels. SVG to RPDA/OM3 was found to be occluded. OM3 got collaterals from RCA. EF was low normal. I performed Successful PCI and DES placement to proximal and mid right coronary artery. Left circumflex occlusion was left to be treated medically. Most recent nuclear stress test in January 2016 was normal. Most recent echocardiogram in August 2016 showed normal LV systolic function, mild to moderate mitral regurgitation and mild pulmonary hypertension.   Plavix was discontinued during last visit. She has been doing well with stable angina that typically response to nitroglycerin. No significant shortness of breath.  Past Medical History:  Diagnosis Date  . Anemia    "after OHS in 01/2013" (05/25/2013)  . Anxiety   . Arthritis    "joints" (05/25/2013)  . Coronary artery disease    a. nl myoview ~ 2006;  b. 01/2013 s/p MI and CABG x 4 (VG->D2, VG->RPDA->OM3, LIMA->LAD;  c. 05/2013 Cath/PCI: LM nl, LAD 60-70p/m, D1 70p, LCX 129m, OM2 60p, RCA 50-60p/41m (3.0x33 Xience DES), VG->D2 60, VG->RPDA/OM3 100, LIMA->LAD nl w 70 dLAD, EF 50%.  . Depression   . Diabetes mellitus, type 2 ([place])    "dx'd 1992" (05/25/2013)  . GERD (gastroesophageal reflux disease)   . Glaucoma    bilateral  . Headache(784.0)    "monthly"  (05/25/2013)  . History of pneumonia 1985  . Hyperlipidemia   . Hypertension   . Hypothyroidism   . IBS (irritable bowel syndrome)   . Lymphocytosis 03/24/2012  . OA (osteoarthritis)   . PONV (postoperative nausea and vomiting)     Past Surgical History:  Procedure Laterality Date  . BLADDER SUSPENSION  2005  . CARDIAC CATHETERIZATION  01/2013  . CARPAL TUNNEL RELEASE Bilateral 1990-1992  . COLONOSCOPY WITH PROPOFOL N/A 11/20/2015   Procedure: COLONOSCOPY WITH PROPOFOL;  Surgeon: Lucilla Lame, MD;  Location: [facility] ENDOSCOPY;  Service: Endoscopy;  Laterality: N/A;  . CORONARY ANGIOPLASTY WITH STENT PLACEMENT  05/25/2013   "1" (05/25/2013)  . CORONARY ARTERY BYPASS GRAFT N/A 01/07/2013   Procedure: CORONARY ARTERY BYPASS GRAFTING (CABG);  Surgeon: Melrose Nakayama, MD;  Location: [facility];  Service: Open Heart Surgery;  Laterality: N/A;  . ESOPHAGOGASTRODUODENOSCOPY (EGD) WITH PROPOFOL N/A 11/20/2015   Procedure: ESOPHAGOGASTRODUODENOSCOPY (EGD) WITH PROPOFOL;  Surgeon: Lucilla Lame, MD;  Location: [facility] ENDOSCOPY;  Service: Endoscopy;  Laterality: N/A;  . HAMMER TOE SURGERY Right 2009  . KNEE ARTHROSCOPY Bilateral 1993-1996  . LEFT HEART CATHETERIZATION WITH CORONARY ANGIOGRAM N/A 01/05/2013   Procedure: LEFT HEART CATHETERIZATION WITH CORONARY ANGIOGRAM;  Surgeon: Wellington Hampshire, MD;  Location: [facility] CATH LAB;  Service: Cardiovascular;  Laterality: N/A;  . LEFT HEART CATHETERIZATION WITH CORONARY/GRAFT ANGIOGRAM N/A 05/25/2013   Procedure: LEFT HEART CATHETERIZATION WITH CORONARY/GRAFT ANGIOGRAM;  Surgeon: Wellington Hampshire, MD;  Location: [facility] CATH LAB;  Service: Cardiovascular;  Laterality: N/A;  . PERCUTANEOUS CORONARY STENT INTERVENTION (PCI-S)  05/25/2013   Procedure: PERCUTANEOUS CORONARY STENT INTERVENTION (PCI-S);  Surgeon: Wellington Hampshire, MD;  Location: [facility] CATH LAB;  Service: Cardiovascular;;  . THYROIDECTOMY  2010  . VAGINAL HYSTERECTOMY  1974     Current Outpatient Prescriptions    Medication Sig Dispense Refill  . acetaminophen (TYLENOL ARTHRITIS PAIN) 650 MG CR tablet Take 1,300 mg by mouth as needed.     . Artificial Tear Ointment (ARTIFICIAL TEARS) ointment Place 1 application into both eyes at bedtime.    Marland Kitchen aspirin 81 MG tablet Take 1 tablet (81 mg total) by mouth daily.    . B-D INS SYRINGE 0.5CC/31GX5/16 31G X 5/16" 0.5 ML MISC daily. as directed  5  . B-D ULTRAFINE III SHORT PEN 31G X 8 MM MISC daily. as directed  5  . carvedilol (COREG) 3.125 MG tablet TAKE 1 TABLET (3.125 MG TOTAL) BY MOUTH 2 (TWO) TIMES DAILY. 180 tablet 3  . insulin detemir (LEVEMIR) 100 UNIT/ML injection Inject 30 Units into the skin at bedtime.     Marland Kitchen ipratropium (ATROVENT) 0.03 % nasal spray USE 2 SPRAYS IN EACH NOSTRIL 3 TIMES DAILY AS NEEDED FOR DRAINAGE  11  . ketotifen (THERA TEARS ALLERGY) 0.025 % ophthalmic solution Place 1 drop into both eyes as needed (for allergy eyes.).     Marland Kitchen latanoprost (XALATAN) 0.005 % ophthalmic solution Place 1 drop into both eyes at bedtime.     Marland Kitchen levothyroxine (SYNTHROID, LEVOTHROID) 88 MCG tablet Take 88 mcg by mouth daily before breakfast.    . lisinopril (PRINIVIL,ZESTRIL) 5 MG tablet Take 1 tablet (5 mg total) by mouth daily. 90 tablet 3  . LORazepam (ATIVAN) 1 MG tablet Take 2.5 mg by mouth at bedtime.    . metFORMIN (GLUCOPHAGE-XR) 500 MG 24 hr tablet Take 1 tablet (500 mg total) by mouth daily with breakfast. (Patient taking differently: Take 1,000 mg by mouth 2 (two) times daily. )    . nitroGLYCERIN (NITROSTAT) 0.4 MG SL tablet Place 1 tablet (0.4 mg total) under the tongue every 5 (five) minutes as needed for chest pain. 25 tablet 6  . ONE TOUCH ULTRA TEST test strip USE AS DIRECTED TO TEST BLOOD SUGAR 3 TIMES A DAY DX E11.51  3  . ONETOUCH DELICA LANCETS 99991111 MISC     . pantoprazole (PROTONIX) 40 MG tablet Take 40 mg by mouth as needed.     Marland Kitchen RANEXA 1000 MG SR tablet TAKE 1 TABLET (1,000 MG TOTAL) BY MOUTH 2 (TWO) TIMES DAILY. 180 tablet 3  .  rosuvastatin (CRESTOR) 5 MG tablet TAKE 1 TABLET BY MOUTH EVERY DAY 90 tablet 3   No current facility-administered medications for this visit.     Allergies:   Amitriptyline; Cortisone; Lipitor [atorvastatin]; Macrodantin [nitrofurantoin]; and Motrin [ibuprofen]    Social History:  The patient  reports that she has never smoked. She has never used smokeless tobacco. She reports that she does not drink alcohol or use drugs.   Family History:  The patient's family history includes Cancer in her mother; Diabetes in her son; Emphysema in her brother and father.    ROS:  Please see the history of present illness.   Otherwise, review of systems are positive for none.   All other systems are reviewed and negative.    PHYSICAL EXAM: VS:  BP (!) 112/54 (BP Location: Left  Arm, Patient Position: Sitting, Cuff Size: Normal)   Pulse (!) 54   Ht 5\' 3"  (1.6 m)   Wt 137 lb 12 oz (62.5 kg)   BMI 24.40 kg/m  , BMI Body mass index is 24.4 kg/m. GEN: Well nourished, well developed, in no acute distress  HEENT: normal  Neck: no JVD, carotid bruits, or masses Cardiac: RRR; no murmurs, rubs, or gallops,no edema  Respiratory:  clear to auscultation bilaterally, normal work of breathing GI: soft, nontender, nondistended, + BS MS: no deformity or atrophy  Skin: warm and dry, no rash Neuro:  Strength and sensation are intact Psych: euthymic mood, full affect   EKG:  EKG is ordered today. The ekg ordered today demonstrates Normal sinus rhythm with left axis deviation. No significant ST or T wave changes   Recent Labs: No results found for requested labs within last 8760 hours.    Lipid Panel    Component Value Date/Time   CHOL 164 06/24/2013 1031   TRIG 143 06/24/2013 1031   HDL 49 06/24/2013 1031   CHOLHDL 3.3 06/24/2013 1031   CHOLHDL 6.2 01/06/2013 0245   VLDL 28 01/06/2013 0245   LDLCALC 86 06/24/2013 1031      Wt Readings from Last 3 Encounters:  09/25/16 137 lb 12 oz (62.5 kg)    03/27/16 137 lb 4 oz (62.3 kg)  12/04/15 142 lb 6.4 oz (64.6 kg)        ASSESSMENT AND PLAN:  1.  Coronary artery disease involving bypass graft with stable angina: Symptoms are reasonably controlled with medications.  I recommend continuing medical therapy.  2. Essential hypertension: Blood pressure is controlled on current medications.  3. Hyperlipidemia with intolerance to statins. She has been able to tolerate small dose rosuvastatin with subsequent improvement.   Disposition:   FU with me in 6 months  Signed,  Kathlyn Sacramento, MD  09/25/2016 4:51 PM    [place]

## 2016-09-25 NOTE — Patient Instructions (Signed)
Medication Instructions: Continue same medications.   Labwork: None.   Procedures/Testing: None.   Follow-Up: 6 months with Dr. Aleighya Mcanelly.   Any Additional Special Instructions Will Be Listed Below (If Applicable).     If you need a refill on your cardiac medications before your next appointment, please call your pharmacy.   

## 2016-11-18 DIAGNOSIS — H401132 Primary open-angle glaucoma, bilateral, moderate stage: Secondary | ICD-10-CM | POA: Diagnosis not present

## 2016-11-20 ENCOUNTER — Other Ambulatory Visit: Payer: Self-pay | Admitting: Cardiovascular Disease

## 2017-01-12 DIAGNOSIS — E1129 Type 2 diabetes mellitus with other diabetic kidney complication: Secondary | ICD-10-CM | POA: Diagnosis not present

## 2017-01-12 DIAGNOSIS — I129 Hypertensive chronic kidney disease with stage 1 through stage 4 chronic kidney disease, or unspecified chronic kidney disease: Secondary | ICD-10-CM | POA: Diagnosis not present

## 2017-01-12 DIAGNOSIS — F329 Major depressive disorder, single episode, unspecified: Secondary | ICD-10-CM | POA: Diagnosis not present

## 2017-01-12 DIAGNOSIS — I2510 Atherosclerotic heart disease of native coronary artery without angina pectoris: Secondary | ICD-10-CM | POA: Diagnosis not present

## 2017-01-12 DIAGNOSIS — N183 Chronic kidney disease, stage 3 (moderate): Secondary | ICD-10-CM | POA: Diagnosis not present

## 2017-01-12 DIAGNOSIS — I209 Angina pectoris, unspecified: Secondary | ICD-10-CM | POA: Diagnosis not present

## 2017-01-12 DIAGNOSIS — E1151 Type 2 diabetes mellitus with diabetic peripheral angiopathy without gangrene: Secondary | ICD-10-CM | POA: Diagnosis not present

## 2017-01-12 DIAGNOSIS — E890 Postprocedural hypothyroidism: Secondary | ICD-10-CM | POA: Diagnosis not present

## 2017-01-12 DIAGNOSIS — E042 Nontoxic multinodular goiter: Secondary | ICD-10-CM | POA: Diagnosis not present

## 2017-01-12 DIAGNOSIS — I700 Atherosclerosis of aorta: Secondary | ICD-10-CM | POA: Diagnosis not present

## 2017-01-12 DIAGNOSIS — K589 Irritable bowel syndrome without diarrhea: Secondary | ICD-10-CM | POA: Diagnosis not present

## 2017-01-12 DIAGNOSIS — Z6824 Body mass index (BMI) 24.0-24.9, adult: Secondary | ICD-10-CM | POA: Diagnosis not present

## 2017-01-30 ENCOUNTER — Other Ambulatory Visit: Payer: Self-pay | Admitting: Cardiovascular Disease

## 2017-02-12 ENCOUNTER — Other Ambulatory Visit: Payer: Self-pay | Admitting: *Deleted

## 2017-02-12 MED ORDER — LISINOPRIL 5 MG PO TABS: 5.0000 mg | ORAL_TABLET | Freq: Every day | ORAL | 3 refills | Status: DC

## 2017-02-12 MED ORDER — ROSUVASTATIN CALCIUM 5 MG PO TABS
5.0000 mg | ORAL_TABLET | Freq: Every day | ORAL | 3 refills | Status: DC
Start: 2017-02-12 — End: 2018-01-27

## 2017-03-04 ENCOUNTER — Other Ambulatory Visit: Payer: Self-pay | Admitting: Cardiovascular Disease

## 2017-03-17 ENCOUNTER — Emergency Department (HOSPITAL_COMMUNITY)
Admission: EM | Admit: 2017-03-17 | Discharge: 2017-03-17 | Disposition: A | Discharge status: Home / Self Care | Payer: Medicare Other | Attending: Emergency Medicine | Admitting: Emergency Medicine

## 2017-03-17 ENCOUNTER — Emergency Department (HOSPITAL_COMMUNITY): Payer: Medicare Other

## 2017-03-17 ENCOUNTER — Encounter (HOSPITAL_COMMUNITY): Payer: Self-pay

## 2017-03-17 DIAGNOSIS — Z79899 Other long term (current) drug therapy: Secondary | ICD-10-CM | POA: Diagnosis not present

## 2017-03-17 DIAGNOSIS — E039 Hypothyroidism, unspecified: Secondary | ICD-10-CM | POA: Diagnosis not present

## 2017-03-17 DIAGNOSIS — E119 Type 2 diabetes mellitus without complications: Secondary | ICD-10-CM | POA: Diagnosis not present

## 2017-03-17 DIAGNOSIS — Z7984 Long term (current) use of oral hypoglycemic drugs: Secondary | ICD-10-CM | POA: Insufficient documentation

## 2017-03-17 DIAGNOSIS — D649 Anemia, unspecified: Secondary | ICD-10-CM | POA: Insufficient documentation

## 2017-03-17 DIAGNOSIS — R1084 Generalized abdominal pain: Secondary | ICD-10-CM | POA: Insufficient documentation

## 2017-03-17 DIAGNOSIS — K219 Gastro-esophageal reflux disease without esophagitis: Secondary | ICD-10-CM | POA: Insufficient documentation

## 2017-03-17 DIAGNOSIS — I10 Essential (primary) hypertension: Secondary | ICD-10-CM | POA: Diagnosis not present

## 2017-03-17 DIAGNOSIS — R339 Retention of urine, unspecified: Secondary | ICD-10-CM | POA: Diagnosis present

## 2017-03-17 DIAGNOSIS — R34 Anuria and oliguria: Secondary | ICD-10-CM | POA: Diagnosis not present

## 2017-03-17 DIAGNOSIS — E785 Hyperlipidemia, unspecified: Secondary | ICD-10-CM | POA: Diagnosis not present

## 2017-03-17 DIAGNOSIS — R109 Unspecified abdominal pain: Secondary | ICD-10-CM | POA: Diagnosis not present

## 2017-03-17 DIAGNOSIS — E860 Dehydration: Secondary | ICD-10-CM | POA: Diagnosis not present

## 2017-03-17 DIAGNOSIS — I2510 Atherosclerotic heart disease of native coronary artery without angina pectoris: Secondary | ICD-10-CM | POA: Insufficient documentation

## 2017-03-17 LAB — URINALYSIS, ROUTINE W REFLEX MICROSCOPIC
Bilirubin Urine: NEGATIVE
Glucose, UA: 50 mg/dL — AB
Hgb urine dipstick: NEGATIVE
Ketones, ur: NEGATIVE mg/dL
Leukocytes, UA: NEGATIVE
Nitrite: NEGATIVE
Protein, ur: NEGATIVE mg/dL
Specific Gravity, Urine: 1.01 (ref 1.005–1.030)
pH: 5 (ref 5.0–8.0)

## 2017-03-17 LAB — BASIC METABOLIC PANEL
Anion gap: 8 (ref 5–15)
BUN: 17 mg/dL (ref 6–20)
CHLORIDE: 106 mmol/L (ref 101–111)
CO2: 23 mmol/L (ref 22–32)
CREATININE: 1.23 mg/dL — AB (ref 0.44–1.00)
Calcium: 8.9 mg/dL (ref 8.9–10.3)
GFR calc non Af Amer: 42 mL/min — ABNORMAL LOW (ref >60)
GFR, EST AFRICAN AMERICAN: 48 mL/min — AB (ref >60)
Glucose, Bld: 283 mg/dL — ABNORMAL HIGH (ref 65–99)
POTASSIUM: 4.9 mmol/L (ref 3.5–5.1)
Sodium: 137 mmol/L (ref 135–145)

## 2017-03-17 LAB — CBC
HEMATOCRIT: 30.7 % — AB (ref 36.0–46.0)
Hemoglobin: 10.5 g/dL — ABNORMAL LOW (ref 12.0–15.0)
MCH: 32.2 pg (ref 26.0–34.0)
MCHC: 34.2 g/dL (ref 30.0–36.0)
MCV: 94.2 fL (ref 78.0–100.0)
PLATELETS: 245 10*3/uL (ref 150–400)
RBC: 3.26 MIL/uL — AB (ref 3.87–5.11)
RDW: 12.2 % (ref 11.5–15.5)
WBC: 5.4 10*3/uL (ref 4.0–10.5)

## 2017-03-17 MED ORDER — SODIUM CHLORIDE 0.9 % IV BOLUS (SEPSIS)
1000.0000 mL | Freq: Once | INTRAVENOUS | Status: AC
  Administered 2017-03-17: 1000 mL via INTRAVENOUS

## 2017-03-17 NOTE — ED Notes (Signed)
Pt unable to void 

## 2017-03-17 NOTE — ED Notes (Signed)
Patient returned from CT

## 2017-03-17 NOTE — ED Notes (Signed)
Patient brought to room via wheelchair and is now changing into gown.

## 2017-03-17 NOTE — ED Notes (Signed)
Angela Barrett [phone]  Pt daughter please call before pt discharged

## 2017-03-17 NOTE — ED Triage Notes (Signed)
Per Pt, Pt has a decrease of urinary output in the last two weeks. Reports some pressure in lower abdomen along with some blood in her urine. Flank pain noted bilaterally. Denies fevers.

## 2017-03-17 NOTE — ED Notes (Signed)
Patient taken to CT.

## 2017-03-17 NOTE — ED Provider Notes (Signed)
[facility] DEPT Provider Note   CSN: [id number] Arrival date & time: 03/17/17  1212     History   Chief Complaint Chief Complaint  Patient presents with  . Urinary Retention    HPI TAMETRIA AHO is a 76 y.o. female.  HPI  76 year old female past medical history of diabetes, hypertension, hyperlipidemia who presents to the emergency department tonight secondary to decreased urine output. Patient states that she has the urge to urinate multiple times of the day but only a few times when she urinates some but not much. Did have hematuria most recently. Has history of having had her bladder or lapse status post repair. Does radiate to her right back. No fever, nausea or vomiting.  Past Medical History:  Diagnosis Date  . Anemia    "after OHS in 01/2013" (05/25/2013)  . Anxiety   . Arthritis    "joints" (05/25/2013)  . Coronary artery disease    a. nl myoview ~ 2006;  b. 01/2013 s/p MI and CABG x 4 (VG->D2, VG->RPDA->OM3, LIMA->LAD;  c. 05/2013 Cath/PCI: LM nl, LAD 60-70p/m, D1 70p, LCX 146m, OM2 60p, RCA 50-60p/30m (3.0x33 Xience DES), VG->D2 60, VG->RPDA/OM3 100, LIMA->LAD nl w 70 dLAD, EF 50%.  . Depression   . Diabetes mellitus, type 2 ([place])    "dx'd 1992" (05/25/2013)  . GERD (gastroesophageal reflux disease)   . Glaucoma    bilateral  . Headache(784.0)    "monthly" (05/25/2013)  . History of pneumonia 1985  . Hyperlipidemia   . Hypertension   . Hypothyroidism   . IBS (irritable bowel syndrome)   . Lymphocytosis 03/24/2012  . OA (osteoarthritis)   . PONV (postoperative nausea and vomiting)     Patient Active Problem List   Diagnosis Date Noted  . Noninfectious diarrhea   . Problems with swallowing and mastication   . PAD (peripheral artery disease) ([place]) 09/21/2015  . Palpitations 03/12/2015  . Bilateral leg pain 03/12/2015  . Leg edema 04/14/2014  . Unstable angina ([place]) 05/26/2013  . Hypothyroidism   . Diabetes mellitus, type 2 ([place])   . Hypertension     . Coronary artery disease   . GERD (gastroesophageal reflux disease)   . Hyperlipidemia   . NSTEMI (non-ST elevated myocardial infarction) ([place]) 01/12/2013  . 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 Diabetes ([place]) 01/12/2013  . Unspecified hypothyroidism 01/12/2013  . Lymphocytosis 03/24/2012    Past Surgical History:  Procedure Laterality Date  . BLADDER SUSPENSION  2005  . CARDIAC CATHETERIZATION  01/2013  . CARPAL TUNNEL RELEASE Bilateral 1990-1992  . COLONOSCOPY WITH PROPOFOL N/A 11/20/2015   Procedure: COLONOSCOPY WITH PROPOFOL;  Surgeon: Lucilla Lame, MD;  Location: [facility] ENDOSCOPY;  Service: Endoscopy;  Laterality: N/A;  . CORONARY ANGIOPLASTY WITH STENT PLACEMENT  05/25/2013   "1" (05/25/2013)  . CORONARY ARTERY BYPASS GRAFT N/A 01/07/2013   Procedure: CORONARY ARTERY BYPASS GRAFTING (CABG);  Surgeon: Melrose Nakayama, MD;  Location: [place];  Service: Open Heart Surgery;  Laterality: N/A;  . ESOPHAGOGASTRODUODENOSCOPY (EGD) WITH PROPOFOL N/A 11/20/2015   Procedure: ESOPHAGOGASTRODUODENOSCOPY (EGD) WITH PROPOFOL;  Surgeon: Lucilla Lame, MD;  Location: [facility] ENDOSCOPY;  Service: Endoscopy;  Laterality: N/A;  . HAMMER TOE SURGERY Right 2009  . KNEE ARTHROSCOPY Bilateral 1993-1996  . LEFT HEART CATHETERIZATION WITH CORONARY ANGIOGRAM N/A 01/05/2013   Procedure: LEFT HEART CATHETERIZATION WITH CORONARY ANGIOGRAM;  Surgeon: Wellington Hampshire, MD;  Location: [facility] CATH LAB;  Service: Cardiovascular;  Laterality: N/A;  . LEFT HEART CATHETERIZATION WITH CORONARY/GRAFT ANGIOGRAM  N/A 05/25/2013   Procedure: LEFT HEART CATHETERIZATION WITH Beatrix Fetters;  Surgeon: Wellington Hampshire, MD;  Location: [facility] CATH LAB;  Service: Cardiovascular;  Laterality: N/A;  . PERCUTANEOUS CORONARY STENT INTERVENTION (PCI-S)  05/25/2013   Procedure: PERCUTANEOUS CORONARY STENT INTERVENTION (PCI-S);  Surgeon: Wellington Hampshire, MD;  Location: [facility] CATH LAB;  Service: Cardiovascular;;  . THYROIDECTOMY  2010  . VAGINAL  HYSTERECTOMY  1974    OB History    No data available       Home Medications    Prior to Admission medications   Medication Sig Start Date End Date Taking? Authorizing Provider  acetaminophen (TYLENOL ARTHRITIS PAIN) 650 MG CR tablet Take 650 mg by mouth at bedtime.    Yes [provider]  Artificial Tear Ointment (ARTIFICIAL TEARS) ointment Place 1 application into both eyes at bedtime.   Yes [provider]  aspirin 81 MG tablet Take 1 tablet (81 mg total) by mouth daily. 05/26/13  Yes Rogelia Mire, NP  carvedilol (COREG) 3.125 MG tablet TAKE 1 TABLET (3.125 MG TOTAL) BY MOUTH 2 (TWO) TIMES DAILY. 03/04/17  Yes Wellington Hampshire, MD  Glucosamine-Chondroitin (GLUCOSAMINE CHONDR COMPLEX PO) Take 1 tablet by mouth daily.   Yes [provider]  insulin detemir (LEVEMIR) 100 UNIT/ML injection Inject 30 Units into the skin at bedtime.    Yes [provider]  ketotifen (THERA TEARS ALLERGY) 0.025 % ophthalmic solution Place 1 drop into both eyes 2 (two) times daily.    Yes [provider]  latanoprost (XALATAN) 0.005 % ophthalmic solution Place 1 drop into both eyes at bedtime.  10/11/13  Yes [provider]  levothyroxine (SYNTHROID, LEVOTHROID) 88 MCG tablet Take 88 mcg by mouth daily before breakfast.   Yes [provider]  lisinopril (PRINIVIL,ZESTRIL) 5 MG tablet Take 1 tablet (5 mg total) by mouth daily. 02/12/17  Yes Wellington Hampshire, MD  LORazepam (ATIVAN) 1 MG tablet Take 2 mg by mouth at bedtime.    Yes [provider]  metFORMIN (GLUCOPHAGE-XR) 500 MG 24 hr tablet Take 1 tablet (500 mg total) by mouth daily with breakfast. Patient taking differently: Take 500 mg by mouth 2 (two) times daily.  05/26/13  Yes Rogelia Mire, NP  nitroGLYCERIN (NITROSTAT) 0.4 MG SL tablet Place 1 tablet (0.4 mg total) under the tongue every 5 (five) minutes as needed for chest pain. 09/25/16  Yes Arida, Mertie Clause, MD    RANEXA 1000 MG SR tablet TAKE 1 TABLET (1,000 MG TOTAL) BY MOUTH 2 (TWO) TIMES DAILY. Patient taking differently: Take 1,000 mg by mouth two times a day 01/30/17  Yes Wellington Hampshire, MD  rosuvastatin (CRESTOR) 5 MG tablet Take 1 tablet (5 mg total) by mouth daily. 02/12/17  Yes Wellington Hampshire, MD  B-D INS SYRINGE 0.5CC/31GX5/16 31G X 5/16" 0.5 ML MISC daily. as directed 11/03/15   [provider]  B-D ULTRAFINE III SHORT PEN 31G X 8 MM MISC daily. as directed 10/31/15   [provider]  ONE TOUCH ULTRA TEST test strip USE AS DIRECTED TO TEST BLOOD SUGAR 3 TIMES A DAY DX E11.51 11/13/15   [provider]  Jonetta Speak LANCETS 42H [place]  10/13/15   [provider]    Family History Family History  Problem Relation Age of Onset  . Cancer Mother        bone CA, died @ 91  . Emphysema Father  died @ 41  . Emphysema Brother        died @ 72  . Diabetes Son        alive and well.    Social History Social History  Substance Use Topics  . Smoking status: Never Smoker  . Smokeless tobacco: Never Used  . Alcohol use No     Allergies   Amitriptyline; Cortisone; Lipitor [atorvastatin]; Macrodantin [nitrofurantoin]; and Motrin [ibuprofen]   Review of Systems Review of Systems  All other systems reviewed and are negative.    Physical Exam Updated Vital Signs BP (!) 150/64   Pulse 66   Temp 98.3 F (36.8 C) (Oral)   Resp 16   Ht 5\' 3"  (1.6 m)   Wt 59 kg (130 lb)   SpO2 100%   BMI 23.03 kg/m   Physical Exam  Constitutional: She is oriented to person, place, and time. She appears well-developed and well-nourished.  HENT:  Head: Normocephalic and atraumatic.  Eyes: Conjunctivae and EOM are normal.  Neck: Normal range of motion.  Cardiovascular: Normal rate and regular rhythm.   Pulmonary/Chest: Effort normal and breath sounds normal. No stridor. No respiratory distress.  Abdominal: Soft. She exhibits no distension.   Musculoskeletal: Normal range of motion. She exhibits no edema or deformity.  Neurological: She is alert and oriented to person, place, and time.  Skin: Skin is warm and dry.  Nursing note and vitals reviewed.    ED Treatments / Results  Labs (all labs ordered are listed, but only abnormal results are displayed) Labs Reviewed  URINALYSIS, ROUTINE W REFLEX MICROSCOPIC - Abnormal; Notable for the following:       Result Value   Color, Urine AMBER (*)    Glucose, UA 50 (*)    All other components within normal limits  BASIC METABOLIC PANEL - Abnormal; Notable for the following:    Glucose, Bld 283 (*)    Creatinine, Ser 1.23 (*)    GFR calc non Af Amer 42 (*)    GFR calc Af Amer 48 (*)    All other components within normal limits  CBC - Abnormal; Notable for the following:    RBC 3.26 (*)    Hemoglobin 10.5 (*)    HCT 30.7 (*)    All other components within normal limits    EKG  EKG Interpretation None       Radiology Ct Renal Stone Study  Result Date: 03/17/2017 CLINICAL DATA:  Right flank pain, nausea EXAM: CT ABDOMEN AND PELVIS WITHOUT CONTRAST TECHNIQUE: Multidetector CT imaging of the abdomen and pelvis was performed following the standard protocol without IV contrast. COMPARISON:  None. FINDINGS: Lower chest: Prior CABG.  No acute abnormality. Hepatobiliary: Hypodensity in the left hepatic dome likely reflects a small cyst. Gallbladder unremarkable. Pancreas: No focal abnormality or ductal dilatation. Spleen: No focal abnormality.  Normal size. Adrenals/Urinary Tract: Adrenal glands are unremarkable. Small layering nonobstructing stone in the right renal pelvis. No ureteral stones or hydronephrosis. Urinary bladder unremarkable. Stomach/Bowel: Scattered colonic diverticula, most pronounced in the sigmoid colon. No active diverticulitis. Stomach and small bowel decompressed. Appendix is normal. Vascular/Lymphatic: Aortic and iliac calcifications. No aneurysm or adenopathy.  Reproductive: Prior hysterectomy.  No adnexal masses. Other: No free fluid or free air. Musculoskeletal: No acute bony abnormality. IMPRESSION: Punctate nonobstructing layering stone in the right renal pelvis. No ureteral stones or hydronephrosis. Colonic diverticulosis.  No active diverticulitis. Aortoiliac atherosclerosis. No acute findings. Electronically Signed   By: Rolm Baptise M.D.  On: 03/17/2017 18:44    Procedures Procedures (including critical care time)  Medications Ordered in ED Medications  sodium chloride 0.9 % bolus 1,000 mL (0 mLs Intravenous Stopped 03/17/17 1838)  sodium chloride 0.9 % bolus 1,000 mL (0 mLs Intravenous Stopped 03/17/17 2005)     Initial Impression / Assessment and Plan / ED Course  I have reviewed the triage vital signs and the nursing notes.  Pertinent labs & imaging results that were available during my care of the patient were reviewed by me and considered in my medical decision making (see chart for details).    Suspect UTI. Will in/out. If normal, will consider CT.   Workup negative. Suspect Not hematuria likely related dehydration instead. Patient with improved urinary output after a couple liters of fluid. Stable for discharge.  Final Clinical Impressions(s) / ED Diagnoses   Final diagnoses:  Decreased urine output  Dehydration     Marji Kuehnel, Corene Cornea, MD 03/17/17 2012

## 2017-03-26 ENCOUNTER — Encounter: Payer: Self-pay | Admitting: Cardiovascular Disease

## 2017-03-26 ENCOUNTER — Ambulatory Visit (INDEPENDENT_AMBULATORY_CARE_PROVIDER_SITE_OTHER): Payer: Medicare Other | Admitting: Cardiovascular Disease

## 2017-03-26 VITALS — BP 131/69 | HR 70 | Ht 63.0 in | Wt 136.5 lb

## 2017-03-26 DIAGNOSIS — E785 Hyperlipidemia, unspecified: Secondary | ICD-10-CM

## 2017-03-26 DIAGNOSIS — I25118 Atherosclerotic heart disease of native coronary artery with other forms of angina pectoris: Secondary | ICD-10-CM

## 2017-03-26 DIAGNOSIS — I25709 Atherosclerosis of coronary artery bypass graft(s), unspecified, with unspecified angina pectoris: Secondary | ICD-10-CM

## 2017-03-26 DIAGNOSIS — I10 Essential (primary) hypertension: Secondary | ICD-10-CM | POA: Diagnosis not present

## 2017-03-26 DIAGNOSIS — I209 Angina pectoris, unspecified: Secondary | ICD-10-CM

## 2017-03-26 MED ORDER — NITROGLYCERIN 0.4 MG SL SUBL: 0.4000 mg | SUBLINGUAL_TABLET | SUBLINGUAL | 6 refills | Status: DC | PRN

## 2017-03-26 NOTE — Progress Notes (Signed)
Cardiology Office Note   Date:  03/26/2017   ID:  Angela, Barrett [birth year], MRN [id number]  PCP:  Burnard Bunting, MD  Cardiologist:   Kathlyn Sacramento, MD   Chief Complaint  Patient presents with  . other    6 month f/u c/o elevated BP and knee pain. Meds reviewed verbally with pt.      History of Present Illness: Angela Barrett is a 76 y.o. female who presents for a followup regarding coronary artery disease status post CABG and PCI.  She has known history of diabetes and hypertension. She had CABG in June, 2014 after NSTEMI.  She had recurrent angina post CABG. Cardiac cath in 05/2013 showed significant 3 vessel CAD with diffuse diabetic vessels. SVG to RPDA/OM3 was found to be occluded. OM3 got collaterals from RCA. EF was low normal. I performed Successful PCI and DES placement to proximal and mid right coronary artery. Left circumflex occlusion was left to be treated medically. Most recent nuclear stress test in January 2016 was normal. Most recent echocardiogram in August 2016 showed normal LV systolic function, mild to moderate mitral regurgitation and mild pulmonary hypertension.   She has been doing well from a cardiac standpoint with no chest pain or shortness of breath. However, she has been under significant stress due to sickness of her son who had complications after neck surgery and also due to progressive dementia affecting her husband.  She went to the emergency room recently with difficulty urinating with dark urine. She improved with hydration and bladder catheterization. She was noted to be more anemic than her baseline.  Past Medical History:  Diagnosis Date  . Anemia    "after OHS in 01/2013" (05/25/2013)  . Anxiety   . Arthritis    "joints" (05/25/2013)  . Coronary artery disease    a. nl myoview ~ 2006;  b. 01/2013 s/p MI and CABG x 4 (VG->D2, VG->RPDA->OM3, LIMA->LAD;  c. 05/2013 Cath/PCI: LM nl, LAD 60-70p/m, D1 70p, LCX 160m, OM2 60p, RCA 50-60p/82m  (3.0x33 Xience DES), VG->D2 60, VG->RPDA/OM3 100, LIMA->LAD nl w 70 dLAD, EF 50%.  . Depression   . Diabetes mellitus, type 2 ([place])    "dx'd 1992" (05/25/2013)  . GERD (gastroesophageal reflux disease)   . Glaucoma    bilateral  . Headache(784.0)    "monthly" (05/25/2013)  . History of pneumonia 1985  . Hyperlipidemia   . Hypertension   . Hypothyroidism   . IBS (irritable bowel syndrome)   . Lymphocytosis 03/24/2012  . OA (osteoarthritis)   . PONV (postoperative nausea and vomiting)     Past Surgical History:  Procedure Laterality Date  . BLADDER SUSPENSION  2005  . CARDIAC CATHETERIZATION  01/2013  . CARPAL TUNNEL RELEASE Bilateral 1990-1992  . COLONOSCOPY WITH PROPOFOL N/A 11/20/2015   Procedure: COLONOSCOPY WITH PROPOFOL;  Surgeon: Lucilla Lame, MD;  Location: [facility] ENDOSCOPY;  Service: Endoscopy;  Laterality: N/A;  . CORONARY ANGIOPLASTY WITH STENT PLACEMENT  05/25/2013   "1" (05/25/2013)  . CORONARY ARTERY BYPASS GRAFT N/A 01/07/2013   Procedure: CORONARY ARTERY BYPASS GRAFTING (CABG);  Surgeon: Melrose Nakayama, MD;  Location: [place];  Service: Open Heart Surgery;  Laterality: N/A;  . ESOPHAGOGASTRODUODENOSCOPY (EGD) WITH PROPOFOL N/A 11/20/2015   Procedure: ESOPHAGOGASTRODUODENOSCOPY (EGD) WITH PROPOFOL;  Surgeon: Lucilla Lame, MD;  Location: [facility] ENDOSCOPY;  Service: Endoscopy;  Laterality: N/A;  . HAMMER TOE SURGERY Right 2009  . KNEE ARTHROSCOPY Bilateral 1993-1996  . LEFT HEART CATHETERIZATION WITH CORONARY ANGIOGRAM  N/A 01/05/2013   Procedure: LEFT HEART CATHETERIZATION WITH CORONARY ANGIOGRAM;  Surgeon: Wellington Hampshire, MD;  Location: [facility] CATH LAB;  Service: Cardiovascular;  Laterality: N/A;  . LEFT HEART CATHETERIZATION WITH CORONARY/GRAFT ANGIOGRAM N/A 05/25/2013   Procedure: LEFT HEART CATHETERIZATION WITH Beatrix Fetters;  Surgeon: Wellington Hampshire, MD;  Location: [facility] CATH LAB;  Service: Cardiovascular;  Laterality: N/A;  . PERCUTANEOUS CORONARY STENT  INTERVENTION (PCI-S)  05/25/2013   Procedure: PERCUTANEOUS CORONARY STENT INTERVENTION (PCI-S);  Surgeon: Wellington Hampshire, MD;  Location: [facility] CATH LAB;  Service: Cardiovascular;;  . THYROIDECTOMY  2010  . VAGINAL HYSTERECTOMY  1974     Current Outpatient Prescriptions  Medication Sig Dispense Refill  . acetaminophen (TYLENOL ARTHRITIS PAIN) 650 MG CR tablet Take 650 mg by mouth at bedtime.     . Artificial Tear Ointment (ARTIFICIAL TEARS) ointment Place 1 application into both eyes at bedtime.    Marland Kitchen aspirin 81 MG tablet Take 1 tablet (81 mg total) by mouth daily.    . B-D INS SYRINGE 0.5CC/31GX5/16 31G X 5/16" 0.5 ML MISC daily. as directed  5  . B-D ULTRAFINE III SHORT PEN 31G X 8 MM MISC daily. as directed  5  . carvedilol (COREG) 3.125 MG tablet TAKE 1 TABLET (3.125 MG TOTAL) BY MOUTH 2 (TWO) TIMES DAILY. 180 tablet 3  . Glucosamine-Chondroitin (GLUCOSAMINE CHONDR COMPLEX PO) Take 1 tablet by mouth daily.    Marland Kitchen latanoprost (XALATAN) 0.005 % ophthalmic solution Place 1 drop into both eyes at bedtime.     Marland Kitchen levothyroxine (SYNTHROID, LEVOTHROID) 88 MCG tablet Take 88 mcg by mouth daily before breakfast.    . lisinopril (PRINIVIL,ZESTRIL) 5 MG tablet Take 1 tablet (5 mg total) by mouth daily. 90 tablet 3  . LORazepam (ATIVAN) 1 MG tablet Take 2 mg by mouth at bedtime.     . metFORMIN (GLUCOPHAGE-XR) 500 MG 24 hr tablet Take 1 tablet (500 mg total) by mouth daily with breakfast. (Patient taking differently: Take 500 mg by mouth 2 (two) times daily. )    . nitroGLYCERIN (NITROSTAT) 0.4 MG SL tablet Place 1 tablet (0.4 mg total) under the tongue every 5 (five) minutes as needed for chest pain. 25 tablet 6  . ONE TOUCH ULTRA TEST test strip USE AS DIRECTED TO TEST BLOOD SUGAR 3 TIMES A DAY DX E11.51  3  . ONETOUCH DELICA LANCETS 41L MISC     . RANEXA 1000 MG SR tablet TAKE 1 TABLET (1,000 MG TOTAL) BY MOUTH 2 (TWO) TIMES DAILY. (Patient taking differently: Take 1,000 mg by mouth two times a day) 180  tablet 3  . rosuvastatin (CRESTOR) 5 MG tablet Take 1 tablet (5 mg total) by mouth daily. 90 tablet 3   No current facility-administered medications for this visit.     Allergies:   Amitriptyline; Cortisone; Lipitor [atorvastatin]; Macrodantin [nitrofurantoin]; and Motrin [ibuprofen]    Social History:  The patient  reports that she has never smoked. She has never used smokeless tobacco. She reports that she does not drink alcohol or use drugs.   Family History:  The patient's family history includes Cancer in her mother; Diabetes in her son; Emphysema in her brother and father.    ROS:  Please see the history of present illness.   Otherwise, review of systems are positive for none.   All other systems are reviewed and negative.    PHYSICAL EXAM: VS:  BP 131/69 (BP Location: Left Arm, Patient Position: Sitting, Cuff Size: Normal)  Pulse 70   Ht 5\' 3"  (1.6 m)   Wt 136 lb 8 oz (61.9 kg)   BMI 24.18 kg/m  , BMI Body mass index is 24.18 kg/m. GEN: Well nourished, well developed, in no acute distress  HEENT: normal  Neck: no JVD, carotid bruits, or masses Cardiac: RRR; no murmurs, rubs, or gallops,no edema  Respiratory:  clear to auscultation bilaterally, normal work of breathing GI: soft, nontender, nondistended, + BS MS: no deformity or atrophy  Skin: warm and dry, no rash Neuro:  Strength and sensation are intact Psych: euthymic mood, full affect   EKG:  EKG is ordered today. The ekg ordered today demonstrates Normal sinus rhythm with  No significant ST or T wave changes   Recent Labs: 03/17/2017: BUN 17; Creatinine, Ser 1.23; Hemoglobin 10.5; Platelets 245; Potassium 4.9; Sodium 137    Lipid Panel    Component Value Date/Time   CHOL 164 06/24/2013 1031   TRIG 143 06/24/2013 1031   HDL 49 06/24/2013 1031   CHOLHDL 3.3 06/24/2013 1031   CHOLHDL 6.2 01/06/2013 0245   VLDL 28 01/06/2013 0245   LDLCALC 86 06/24/2013 1031      Wt Readings from Last 3 Encounters:    03/26/17 136 lb 8 oz (61.9 kg)  03/17/17 130 lb (59 kg)  09/25/16 137 lb 12 oz (62.5 kg)        ASSESSMENT AND PLAN:  1.  Coronary artery disease involving bypass graft with stable angina: Symptoms are reasonably controlled with medications.  I recommend continuing medical therapy.  2. Essential hypertension: Blood pressure is controlled on current medications.  3. Hyperlipidemia with intolerance to statins. She has been able to tolerate small dose rosuvastatin . Lipid profile from last year showed an LDL of 88.  I encouraged her to follow-up with her primary care physician and urologist regarding other noncardiac issues including anemia and discolored urine.   Disposition:   FU with me in 6 months  Signed,  Kathlyn Sacramento, MD  03/26/2017 11:46 AM    [place]

## 2017-03-26 NOTE — Patient Instructions (Signed)
Medication Instructions: Continue same medications.   Labwork: None.   Procedures/Testing: None.   Follow-Up: 6 months with Dr. Jebadiah Imperato.   Any Additional Special Instructions Will Be Listed Below (If Applicable).     If you need a refill on your cardiac medications before your next appointment, please call your pharmacy.   

## 2017-04-15 DIAGNOSIS — E1129 Type 2 diabetes mellitus with other diabetic kidney complication: Secondary | ICD-10-CM | POA: Diagnosis not present

## 2017-04-15 DIAGNOSIS — Z79899 Other long term (current) drug therapy: Secondary | ICD-10-CM | POA: Diagnosis not present

## 2017-04-15 DIAGNOSIS — R8299 Other abnormal findings in urine: Secondary | ICD-10-CM | POA: Diagnosis not present

## 2017-04-15 DIAGNOSIS — N390 Urinary tract infection, site not specified: Secondary | ICD-10-CM | POA: Diagnosis not present

## 2017-04-15 DIAGNOSIS — E890 Postprocedural hypothyroidism: Secondary | ICD-10-CM | POA: Diagnosis not present

## 2017-04-15 DIAGNOSIS — N183 Chronic kidney disease, stage 3 (moderate): Secondary | ICD-10-CM | POA: Diagnosis not present

## 2017-04-22 DIAGNOSIS — Z1389 Encounter for screening for other disorder: Secondary | ICD-10-CM | POA: Diagnosis not present

## 2017-04-22 DIAGNOSIS — I209 Angina pectoris, unspecified: Secondary | ICD-10-CM | POA: Diagnosis not present

## 2017-04-22 DIAGNOSIS — I700 Atherosclerosis of aorta: Secondary | ICD-10-CM | POA: Diagnosis not present

## 2017-04-22 DIAGNOSIS — I129 Hypertensive chronic kidney disease with stage 1 through stage 4 chronic kidney disease, or unspecified chronic kidney disease: Secondary | ICD-10-CM | POA: Diagnosis not present

## 2017-04-22 DIAGNOSIS — E042 Nontoxic multinodular goiter: Secondary | ICD-10-CM | POA: Diagnosis not present

## 2017-04-22 DIAGNOSIS — E1129 Type 2 diabetes mellitus with other diabetic kidney complication: Secondary | ICD-10-CM | POA: Diagnosis not present

## 2017-04-22 DIAGNOSIS — F329 Major depressive disorder, single episode, unspecified: Secondary | ICD-10-CM | POA: Diagnosis not present

## 2017-04-22 DIAGNOSIS — E1151 Type 2 diabetes mellitus with diabetic peripheral angiopathy without gangrene: Secondary | ICD-10-CM | POA: Diagnosis not present

## 2017-04-22 DIAGNOSIS — I2510 Atherosclerotic heart disease of native coronary artery without angina pectoris: Secondary | ICD-10-CM | POA: Diagnosis not present

## 2017-04-22 DIAGNOSIS — Z6824 Body mass index (BMI) 24.0-24.9, adult: Secondary | ICD-10-CM | POA: Diagnosis not present

## 2017-04-22 DIAGNOSIS — E890 Postprocedural hypothyroidism: Secondary | ICD-10-CM | POA: Diagnosis not present

## 2017-04-22 DIAGNOSIS — Z0000 Encounter for general adult medical examination without abnormal findings: Secondary | ICD-10-CM | POA: Diagnosis not present

## 2017-05-27 DIAGNOSIS — H401132 Primary open-angle glaucoma, bilateral, moderate stage: Secondary | ICD-10-CM | POA: Diagnosis not present

## 2017-10-07 DIAGNOSIS — E042 Nontoxic multinodular goiter: Secondary | ICD-10-CM | POA: Diagnosis not present

## 2017-10-07 DIAGNOSIS — I129 Hypertensive chronic kidney disease with stage 1 through stage 4 chronic kidney disease, or unspecified chronic kidney disease: Secondary | ICD-10-CM | POA: Diagnosis not present

## 2017-10-07 DIAGNOSIS — I209 Angina pectoris, unspecified: Secondary | ICD-10-CM | POA: Diagnosis not present

## 2017-10-07 DIAGNOSIS — I700 Atherosclerosis of aorta: Secondary | ICD-10-CM | POA: Diagnosis not present

## 2017-10-07 DIAGNOSIS — Z1389 Encounter for screening for other disorder: Secondary | ICD-10-CM | POA: Diagnosis not present

## 2017-10-07 DIAGNOSIS — N183 Chronic kidney disease, stage 3 (moderate): Secondary | ICD-10-CM | POA: Diagnosis not present

## 2017-10-07 DIAGNOSIS — E890 Postprocedural hypothyroidism: Secondary | ICD-10-CM | POA: Diagnosis not present

## 2017-10-07 DIAGNOSIS — Z6824 Body mass index (BMI) 24.0-24.9, adult: Secondary | ICD-10-CM | POA: Diagnosis not present

## 2017-10-07 DIAGNOSIS — E1151 Type 2 diabetes mellitus with diabetic peripheral angiopathy without gangrene: Secondary | ICD-10-CM | POA: Diagnosis not present

## 2017-10-07 DIAGNOSIS — I2510 Atherosclerotic heart disease of native coronary artery without angina pectoris: Secondary | ICD-10-CM | POA: Diagnosis not present

## 2017-10-07 DIAGNOSIS — E1129 Type 2 diabetes mellitus with other diabetic kidney complication: Secondary | ICD-10-CM | POA: Diagnosis not present

## 2017-10-07 DIAGNOSIS — F329 Major depressive disorder, single episode, unspecified: Secondary | ICD-10-CM | POA: Diagnosis not present

## 2017-10-23 ENCOUNTER — Ambulatory Visit: Payer: Medicare Other | Admitting: Cardiovascular Disease

## 2017-11-06 ENCOUNTER — Encounter: Payer: Self-pay | Admitting: Cardiovascular Disease

## 2017-11-06 ENCOUNTER — Ambulatory Visit (INDEPENDENT_AMBULATORY_CARE_PROVIDER_SITE_OTHER): Payer: Medicare Other | Admitting: Cardiovascular Disease

## 2017-11-06 VITALS — BP 126/60 | HR 68 | Ht 64.0 in | Wt 134.0 lb

## 2017-11-06 DIAGNOSIS — I10 Essential (primary) hypertension: Secondary | ICD-10-CM

## 2017-11-06 DIAGNOSIS — I25118 Atherosclerotic heart disease of native coronary artery with other forms of angina pectoris: Secondary | ICD-10-CM | POA: Diagnosis not present

## 2017-11-06 DIAGNOSIS — E785 Hyperlipidemia, unspecified: Secondary | ICD-10-CM | POA: Diagnosis not present

## 2017-11-06 NOTE — Patient Instructions (Signed)
Medication Instructions: Continue same medications.   Labwork: None.   Procedures/Testing: None.   Follow-Up: 1 year with Dr. Elo Marmolejos.   Any Additional Special Instructions Will Be Listed Below (If Applicable).     If you need a refill on your cardiac medications before your next appointment, please call your pharmacy.   

## 2017-11-06 NOTE — Progress Notes (Signed)
Cardiology Office Note   Date:  11/10/2017   ID:  Angela Barrett, DOB July 22, 1941, MRN [id number]  PCP:  Burnard Bunting, MD  Cardiologist:   Kathlyn Sacramento, MD   Chief Complaint  Patient presents with  . Other    6 month follow up. Patient c/o a little SOB, discomfort in chest, and heart pounding. Meds reviewed verbally with patient.       History of Present Illness: Angela Barrett is a 77 y.o. female who presents for a followup regarding coronary artery disease status post CABG and PCI.  She has known history of diabetes and hypertension. She had CABG in June, 2014 after NSTEMI.  She had recurrent angina post CABG. Cardiac cath in 05/2013 showed significant 3 vessel CAD with diffuse diabetic vessels. SVG to RPDA/OM3 was found to be occluded. OM3 got collaterals from RCA. EF was low normal. I performed Successful PCI and DES placement to proximal and mid right coronary artery. Left circumflex occlusion was left to be treated medically. Most recent nuclear stress test in January 2016 was normal. Most recent echocardiogram in August 2016 showed normal LV systolic function, mild to moderate mitral regurgitation and mild pulmonary hypertension.   She has been doing reasonably  With no recent chest pain.. No shortness of breath.She reports palpitations at night when she is stressed.  Past Medical History:  Diagnosis Date  . Anemia    "after OHS in 01/2013" (05/25/2013)  . Anxiety   . Arthritis    "joints" (05/25/2013)  . Coronary artery disease    a. nl myoview ~ 2006;  b. 01/2013 s/p MI and CABG x 4 (VG->D2, VG->RPDA->OM3, LIMA->LAD;  c. 05/2013 Cath/PCI: LM nl, LAD 60-70p/m, D1 70p, LCX 134m, OM2 60p, RCA 50-60p/54m (3.0x33 Xience DES), VG->D2 60, VG->RPDA/OM3 100, LIMA->LAD nl w 70 dLAD, EF 50%.  . Depression   . Diabetes mellitus, type 2 ([place])    "dx'd 1992" (05/25/2013)  . GERD (gastroesophageal reflux disease)   . Glaucoma    bilateral  . Headache(784.0)    "monthly"  (05/25/2013)  . History of pneumonia 1985  . Hyperlipidemia   . Hypertension   . Hypothyroidism   . IBS (irritable bowel syndrome)   . Lymphocytosis 03/24/2012  . OA (osteoarthritis)   . PONV (postoperative nausea and vomiting)     Past Surgical History:  Procedure Laterality Date  . BLADDER SUSPENSION  2005  . CARDIAC CATHETERIZATION  01/2013  . CARPAL TUNNEL RELEASE Bilateral 1990-1992  . COLONOSCOPY WITH PROPOFOL N/A 11/20/2015   Procedure: COLONOSCOPY WITH PROPOFOL;  Surgeon: Lucilla Lame, MD;  Location: [facility] ENDOSCOPY;  Service: Endoscopy;  Laterality: N/A;  . CORONARY ANGIOPLASTY WITH STENT PLACEMENT  05/25/2013   "1" (05/25/2013)  . CORONARY ARTERY BYPASS GRAFT N/A 01/07/2013   Procedure: CORONARY ARTERY BYPASS GRAFTING (CABG);  Surgeon: Melrose Nakayama, MD;  Location: [place];  Service: Open Heart Surgery;  Laterality: N/A;  . ESOPHAGOGASTRODUODENOSCOPY (EGD) WITH PROPOFOL N/A 11/20/2015   Procedure: ESOPHAGOGASTRODUODENOSCOPY (EGD) WITH PROPOFOL;  Surgeon: Lucilla Lame, MD;  Location: [facility] ENDOSCOPY;  Service: Endoscopy;  Laterality: N/A;  . HAMMER TOE SURGERY Right 2009  . KNEE ARTHROSCOPY Bilateral 1993-1996  . LEFT HEART CATHETERIZATION WITH CORONARY ANGIOGRAM N/A 01/05/2013   Procedure: LEFT HEART CATHETERIZATION WITH CORONARY ANGIOGRAM;  Surgeon: Wellington Hampshire, MD;  Location: [place] CATH LAB;  Service: Cardiovascular;  Laterality: N/A;  . LEFT HEART CATHETERIZATION WITH CORONARY/GRAFT ANGIOGRAM N/A 05/25/2013   Procedure: LEFT HEART CATHETERIZATION WITH CORONARY/GRAFT  ANGIOGRAM;  Surgeon: Wellington Hampshire, MD;  Location: [facility] CATH LAB;  Service: Cardiovascular;  Laterality: N/A;  . PERCUTANEOUS CORONARY STENT INTERVENTION (PCI-S)  05/25/2013   Procedure: PERCUTANEOUS CORONARY STENT INTERVENTION (PCI-S);  Surgeon: Wellington Hampshire, MD;  Location: [facility] CATH LAB;  Service: Cardiovascular;;  . THYROIDECTOMY  2010  . VAGINAL HYSTERECTOMY  1974     Current Outpatient Medications    Medication Sig Dispense Refill  . acetaminophen (TYLENOL ARTHRITIS PAIN) 650 MG CR tablet Take 650 mg by mouth at bedtime.     . Artificial Tear Ointment (ARTIFICIAL TEARS) ointment Place 1 application into both eyes at bedtime.    Marland Kitchen aspirin 81 MG tablet Take 1 tablet (81 mg total) by mouth daily.    . B-D INS SYRINGE 0.5CC/31GX5/16 31G X 5/16" 0.5 ML MISC daily. as directed  5  . B-D ULTRAFINE III SHORT PEN 31G X 8 MM MISC daily. as directed  5  . carvedilol (COREG) 3.125 MG tablet TAKE 1 TABLET (3.125 MG TOTAL) BY MOUTH 2 (TWO) TIMES DAILY. 180 tablet 3  . Glucosamine-Chondroitin (GLUCOSAMINE CHONDR COMPLEX PO) Take 1 tablet by mouth daily.    Marland Kitchen latanoprost (XALATAN) 0.005 % ophthalmic solution Place 1 drop into both eyes at bedtime.     Marland Kitchen LEVEMIR 100 UNIT/ML injection INJECT 35 UNITS SUBCUTANEOUSLY AT BEDTIME AS NEEDED  3  . levothyroxine (SYNTHROID, LEVOTHROID) 88 MCG tablet Take 88 mcg by mouth daily before breakfast.    . lisinopril (PRINIVIL,ZESTRIL) 5 MG tablet Take 1 tablet (5 mg total) by mouth daily. 90 tablet 3  . LORazepam (ATIVAN) 1 MG tablet Take 2 mg by mouth at bedtime.     . metFORMIN (GLUCOPHAGE-XR) 500 MG 24 hr tablet Take 1 tablet (500 mg total) by mouth daily with breakfast. (Patient taking differently: Take 500 mg by mouth 2 (two) times daily. )    . nitroGLYCERIN (NITROSTAT) 0.4 MG SL tablet Place 1 tablet (0.4 mg total) under the tongue every 5 (five) minutes as needed for chest pain. 25 tablet 6  . ONE TOUCH ULTRA TEST test strip USE AS DIRECTED TO TEST BLOOD SUGAR 3 TIMES A DAY DX E11.51  3  . RANEXA 1000 MG SR tablet TAKE 1 TABLET (1,000 MG TOTAL) BY MOUTH 2 (TWO) TIMES DAILY. (Patient taking differently: Take 1,000 mg by mouth two times a day) 180 tablet 3  . rosuvastatin (CRESTOR) 5 MG tablet Take 1 tablet (5 mg total) by mouth daily. 90 tablet 3   No current facility-administered medications for this visit.     Allergies:   Amitriptyline; Cortisone; Lipitor  [atorvastatin]; Macrodantin [nitrofurantoin]; and Motrin [ibuprofen]    Social History:  The patient  reports that she has never smoked. She has never used smokeless tobacco. She reports that she does not drink alcohol or use drugs.   Family History:  The patient's family history includes Cancer in her mother; Diabetes in her son; Emphysema in her brother and father.    ROS:  Please see the history of present illness.   Otherwise, review of systems are positive for none.   All other systems are reviewed and negative.    PHYSICAL EXAM: VS:  BP 126/60 (BP Location: Left Arm, Patient Position: Sitting, Cuff Size: Normal)   Pulse 68   Ht 5\' 4"  (1.626 m)   Wt 134 lb (60.8 kg)   BMI 23.00 kg/m  , BMI Body mass index is 23 kg/m. GEN: Well nourished, well developed, in no  acute distress  HEENT: normal  Neck: no JVD, carotid bruits, or masses Cardiac: RRR; no murmurs, rubs, or gallops,no edema  Respiratory:  clear to auscultation bilaterally, normal work of breathing GI: soft, nontender, nondistended, + BS MS: no deformity or atrophy  Skin: warm and dry, no rash Neuro:  Strength and sensation are intact Psych: euthymic mood, full affect   EKG:  EKG is ordered today. The ekg ordered today demonstrates Normal sinus rhythm with  No significant ST or T wave changes   Recent Labs: 03/17/2017: BUN 17; Creatinine, Ser 1.23; Hemoglobin 10.5; Platelets 245; Potassium 4.9; Sodium 137    Lipid Panel    Component Value Date/Time   CHOL 164 06/24/2013 1031   TRIG 143 06/24/2013 1031   HDL 49 06/24/2013 1031   CHOLHDL 3.3 06/24/2013 1031   CHOLHDL 6.2 01/06/2013 0245   VLDL 28 01/06/2013 0245   LDLCALC 86 06/24/2013 1031      Wt Readings from Last 3 Encounters:  11/06/17 134 lb (60.8 kg)  03/26/17 136 lb 8 oz (61.9 kg)  03/17/17 130 lb (59 kg)        ASSESSMENT AND PLAN:  1.  Coronary artery disease involving bypass graft with stable angina: Symptoms are reasonably controlled  with medications.  I recommend continuing medical therapy.  2. Essential hypertension: Blood pressure is controlled on current medications.  3. Hyperlipidemia with intolerance to statins. She has been able to tolerate small dose rosuvastatin . I reviewed most recent lipid profile showed an LDL of 61.   Disposition:   FU with me in 12 months  Signed,  Kathlyn Sacramento, MD  11/10/2017 6:14 PM    [facility]

## 2017-11-30 DIAGNOSIS — H401132 Primary open-angle glaucoma, bilateral, moderate stage: Secondary | ICD-10-CM | POA: Diagnosis not present

## 2017-12-29 ENCOUNTER — Other Ambulatory Visit: Payer: Self-pay

## 2017-12-29 ENCOUNTER — Emergency Department
Admission: EM | Admit: 2017-12-29 | Discharge: 2017-12-30 | Disposition: A | Discharge status: Home / Self Care | Payer: Medicare Other | Attending: Emergency Medicine | Admitting: Emergency Medicine

## 2017-12-29 ENCOUNTER — Encounter: Payer: Self-pay | Admitting: *Deleted

## 2017-12-29 DIAGNOSIS — N3000 Acute cystitis without hematuria: Secondary | ICD-10-CM | POA: Insufficient documentation

## 2017-12-29 DIAGNOSIS — Z794 Long term (current) use of insulin: Secondary | ICD-10-CM | POA: Insufficient documentation

## 2017-12-29 DIAGNOSIS — M6281 Muscle weakness (generalized): Secondary | ICD-10-CM | POA: Diagnosis not present

## 2017-12-29 DIAGNOSIS — R531 Weakness: Secondary | ICD-10-CM | POA: Diagnosis not present

## 2017-12-29 DIAGNOSIS — E871 Hypo-osmolality and hyponatremia: Secondary | ICD-10-CM | POA: Diagnosis not present

## 2017-12-29 DIAGNOSIS — N289 Disorder of kidney and ureter, unspecified: Secondary | ICD-10-CM | POA: Diagnosis not present

## 2017-12-29 DIAGNOSIS — Z7982 Long term (current) use of aspirin: Secondary | ICD-10-CM | POA: Diagnosis not present

## 2017-12-29 DIAGNOSIS — I119 Hypertensive heart disease without heart failure: Secondary | ICD-10-CM | POA: Insufficient documentation

## 2017-12-29 DIAGNOSIS — E119 Type 2 diabetes mellitus without complications: Secondary | ICD-10-CM | POA: Diagnosis not present

## 2017-12-29 DIAGNOSIS — I2510 Atherosclerotic heart disease of native coronary artery without angina pectoris: Secondary | ICD-10-CM | POA: Diagnosis not present

## 2017-12-29 DIAGNOSIS — Z79899 Other long term (current) drug therapy: Secondary | ICD-10-CM | POA: Insufficient documentation

## 2017-12-29 LAB — COMPREHENSIVE METABOLIC PANEL
ALK PHOS: 44 U/L (ref 38–126)
ALT: 20 U/L (ref 14–54)
ANION GAP: 10 (ref 5–15)
AST: 24 U/L (ref 15–41)
Albumin: 3.8 g/dL (ref 3.5–5.0)
BUN: 28 mg/dL — ABNORMAL HIGH (ref 6–20)
CALCIUM: 8.6 mg/dL — AB (ref 8.9–10.3)
CO2: 20 mmol/L — AB (ref 22–32)
Chloride: 103 mmol/L (ref 101–111)
Creatinine, Ser: 1.42 mg/dL — ABNORMAL HIGH (ref 0.44–1.00)
GFR calc non Af Amer: 35 mL/min — ABNORMAL LOW (ref >60)
GFR, EST AFRICAN AMERICAN: 40 mL/min — AB (ref >60)
Glucose, Bld: 161 mg/dL — ABNORMAL HIGH (ref 65–99)
Potassium: 5 mmol/L (ref 3.5–5.1)
SODIUM: 133 mmol/L — AB (ref 135–145)
TOTAL PROTEIN: 7.4 g/dL (ref 6.5–8.1)
Total Bilirubin: 0.7 mg/dL (ref 0.3–1.2)

## 2017-12-29 LAB — URINALYSIS, COMPLETE (UACMP) WITH MICROSCOPIC
Bilirubin Urine: NEGATIVE
GLUCOSE, UA: NEGATIVE mg/dL
Ketones, ur: NEGATIVE mg/dL
NITRITE: POSITIVE — AB
PH: 5 (ref 5.0–8.0)
Protein, ur: NEGATIVE mg/dL
SPECIFIC GRAVITY, URINE: 1.008 (ref 1.005–1.030)
Squamous Epithelial / LPF: NONE SEEN (ref 0–5)
WBC, UA: >50 WBC/hpf — ABNORMAL HIGH (ref 0–5)

## 2017-12-29 LAB — CBC
HCT: 28.7 % — ABNORMAL LOW (ref 35.0–47.0)
HEMOGLOBIN: 10 g/dL — AB (ref 12.0–16.0)
MCH: 33.7 pg (ref 26.0–34.0)
MCHC: 35 g/dL (ref 32.0–36.0)
MCV: 96.4 fL (ref 80.0–100.0)
Platelets: 263 10*3/uL (ref 150–440)
RBC: 2.97 MIL/uL — ABNORMAL LOW (ref 3.80–5.20)
RDW: 12.1 % (ref 11.5–14.5)
WBC: 7.6 10*3/uL (ref 3.6–11.0)

## 2017-12-29 MED ORDER — CEPHALEXIN 500 MG PO CAPS: 500.0000 mg | ORAL_CAPSULE | Freq: Four times a day (QID) | ORAL | 0 refills | Status: AC

## 2017-12-29 MED ORDER — SODIUM CHLORIDE 0.9 % IV BOLUS
1000.0000 mL | Freq: Once | INTRAVENOUS | Status: AC
  Administered 2017-12-29: 1000 mL via INTRAVENOUS

## 2017-12-29 MED ORDER — ONDANSETRON HCL 4 MG/2ML IJ SOLN
INTRAMUSCULAR | Status: AC
  Filled 2017-12-29: qty 2

## 2017-12-29 MED ORDER — SODIUM CHLORIDE 0.9 % IV SOLN
1.0000 g | Freq: Once | INTRAVENOUS | Status: AC
  Administered 2017-12-29: 1 g via INTRAVENOUS
  Filled 2017-12-29: qty 10

## 2017-12-29 MED ORDER — ACETAMINOPHEN 500 MG PO TABS
1000.0000 mg | ORAL_TABLET | Freq: Once | ORAL | Status: AC
  Administered 2017-12-29: 1000 mg via ORAL
  Filled 2017-12-29: qty 2

## 2017-12-29 MED ORDER — ONDANSETRON 4 MG PO TBDP: 4.0000 mg | ORAL_TABLET | Freq: Three times a day (TID) | ORAL | 0 refills | Status: AC | PRN

## 2017-12-29 MED ORDER — ONDANSETRON HCL 4 MG/2ML IJ SOLN
4.0000 mg | Freq: Once | INTRAMUSCULAR | Status: AC
  Administered 2017-12-29: 4 mg via INTRAVENOUS

## 2017-12-29 NOTE — Discharge Instructions (Addendum)
These drink plenty of fluids stay well-hydrated and to help clear your urine infection.  Please take the entire course of antibiotics, even if you are feeling better.  Zofran is for nausea and vomiting.  Return to the emergency department if you develop severe pain, lightheadedness or fainting, fever, inability to keep down fluids, or any other symptoms concerning to you.

## 2017-12-29 NOTE — ED Provider Notes (Signed)
[facility] Emergency Department Provider Note  ____________________________________________  Time seen: Approximately 8:53 PM  I have reviewed the triage vital signs and the nursing notes.   HISTORY  Chief Complaint Fever and Weakness    HPI Angela Barrett is a 77 y.o. female HTN, DM, CAD presenting with generalized weakness, dysuria, nausea. The patient denies any vomiting, or constipation.  She has chronic diarrhea which is unchanged.  She has not been having any cough or cold symptoms, abdominal pain.  She is felt feverish, and has had approximately 2 episodes of dysuria without urinary frequency or hematuria.  EMS noted the patient to be hemodynamically stable, afebrile, and with a normal blood sugar.   Past Medical History:  Diagnosis Date  . Anemia    "after OHS in 01/2013" (05/25/2013)  . Anxiety   . Arthritis    "joints" (05/25/2013)  . Coronary artery disease    a. nl myoview ~ 2006;  b. 01/2013 s/p MI and CABG x 4 (VG->D2, VG->RPDA->OM3, LIMA->LAD;  c. 05/2013 Cath/PCI: LM nl, LAD 60-70p/m, D1 70p, LCX 168m, OM2 60p, RCA 50-60p/85m (3.0x33 Xience DES), VG->D2 60, VG->RPDA/OM3 100, LIMA->LAD nl w 70 dLAD, EF 50%.  . Depression   . Diabetes mellitus, type 2 ([place])    "dx'd 1992" (05/25/2013)  . GERD (gastroesophageal reflux disease)   . Glaucoma    bilateral  . Headache(784.0)    "monthly" (05/25/2013)  . History of pneumonia 1985  . Hyperlipidemia   . Hypertension   . Hypothyroidism   . IBS (irritable bowel syndrome)   . Lymphocytosis 03/24/2012  . OA (osteoarthritis)   . PONV (postoperative nausea and vomiting)     Patient Active Problem List   Diagnosis Date Noted  . Noninfectious diarrhea   . Problems with swallowing and mastication   . PAD (peripheral artery disease) ([place]) 09/21/2015  . Palpitations 03/12/2015  . Bilateral leg pain 03/12/2015  . Leg edema 04/14/2014  . Unstable angina ([place]) 05/26/2013  . Hypothyroidism   .  Diabetes mellitus, type 2 ([place])   . Hypertension   . Coronary artery disease   . GERD (gastroesophageal reflux disease)   . Hyperlipidemia   . NSTEMI (non-ST elevated myocardial infarction) ([place]) 01/12/2013  . 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 Diabetes ([place]) 01/12/2013  . Unspecified hypothyroidism 01/12/2013  . Lymphocytosis 03/24/2012    Past Surgical History:  Procedure Laterality Date  . BLADDER SUSPENSION  2005  . CARDIAC CATHETERIZATION  01/2013  . CARPAL TUNNEL RELEASE Bilateral 1990-1992  . COLONOSCOPY WITH PROPOFOL N/A 11/20/2015   Procedure: COLONOSCOPY WITH PROPOFOL;  Surgeon: Lucilla Lame, MD;  Location: [facility] ENDOSCOPY;  Service: Endoscopy;  Laterality: N/A;  . CORONARY ANGIOPLASTY WITH STENT PLACEMENT  05/25/2013   "1" (05/25/2013)  . CORONARY ARTERY BYPASS GRAFT N/A 01/07/2013   Procedure: CORONARY ARTERY BYPASS GRAFTING (CABG);  Surgeon: Melrose Nakayama, MD;  Location: [place];  Service: Open Heart Surgery;  Laterality: N/A;  . ESOPHAGOGASTRODUODENOSCOPY (EGD) WITH PROPOFOL N/A 11/20/2015   Procedure: ESOPHAGOGASTRODUODENOSCOPY (EGD) WITH PROPOFOL;  Surgeon: Lucilla Lame, MD;  Location: [facility] ENDOSCOPY;  Service: Endoscopy;  Laterality: N/A;  . HAMMER TOE SURGERY Right 2009  . KNEE ARTHROSCOPY Bilateral 1993-1996  . LEFT HEART CATHETERIZATION WITH CORONARY ANGIOGRAM N/A 01/05/2013   Procedure: LEFT HEART CATHETERIZATION WITH CORONARY ANGIOGRAM;  Surgeon: Wellington Hampshire, MD;  Location: [place] CATH LAB;  Service: Cardiovascular;  Laterality: N/A;  . LEFT HEART CATHETERIZATION WITH CORONARY/GRAFT ANGIOGRAM N/A 05/25/2013  Procedure: LEFT HEART CATHETERIZATION WITH Beatrix Fetters;  Surgeon: Wellington Hampshire, MD;  Location: [facility] CATH LAB;  Service: Cardiovascular;  Laterality: N/A;  . PERCUTANEOUS CORONARY STENT INTERVENTION (PCI-S)  05/25/2013   Procedure: PERCUTANEOUS CORONARY STENT INTERVENTION (PCI-S);  Surgeon: Wellington Hampshire, MD;  Location: [facility] CATH LAB;  Service:  Cardiovascular;;  . THYROIDECTOMY  2010  . VAGINAL HYSTERECTOMY  1974    Current Outpatient Rx  . Order #: [id number] Class: Historical Med  . Order #: [id number] Class: Historical Med  . Order #: [id number] Class: No Print  . Order #: [id number] Class: Historical Med  . Order #: [id number] Class: Historical Med  . Order #: [id number] Class: Normal  . Order #: [id number] Class: Print  . Order #: [id number] Class: Historical Med  . Order #: [id number] Class: Historical Med  . Order #: [id number] Class: Historical Med  . Order #: [id number] Class: Historical Med  . Order #: [id number] Class: Normal  . Order #: [id number] Class: Historical Med  . Order #: [id number] Class: No Print  . Order #: [id number] Class: Normal  . Order #: [id number] Class: Print  . Order #: [id number] Class: Historical Med  . Order #: [id number] Class: Normal  . Order #: [id number] Class: Normal    Allergies Amitriptyline; Cortisone; Lipitor [atorvastatin]; Macrodantin [nitrofurantoin]; and Motrin [ibuprofen]  Family History  Problem Relation Age of Onset  . Cancer Mother        bone CA, died @ 4  . Emphysema Father        died @ 55  . Emphysema Brother        died @ 67  . Diabetes Son        alive and well.    Social History Social History   Tobacco Use  . Smoking status: Never Smoker  . Smokeless tobacco: Never Used  Substance Use Topics  . Alcohol use: No  . Drug use: No    Review of Systems Constitutional: No fever/chills.  No lightheadedness or syncope.  Positive generalized weakness. Eyes: No visual changes.  No eye discharge. ENT: No sore throat. No congestion or rhinorrhea.  No ear pain. Cardiovascular: Denies chest pain. Denies palpitations. Respiratory: Denies shortness of breath.  No cough. Gastrointestinal: No abdominal pain.  Positive nausea, no vomiting.  No diarrhea.  No constipation. Genitourinary: Positive for dysuria.  No urinary frequency or hematuria. Musculoskeletal: Negative for back pain. Skin: Negative  for rash. Neurological: Negative for headaches. No focal numbness, tingling or weakness.     ____________________________________________   PHYSICAL EXAM:  VITAL SIGNS: ED Triage Vitals  Enc Vitals Group     BP 12/29/17 2041 (!) 155/61     Pulse Rate 12/29/17 2041 70     Resp 12/29/17 2043 20     Temp 12/29/17 2043 99.3 F (37.4 C)     Temp Source 12/29/17 2043 Oral     SpO2 12/29/17 2041 93 %     Weight 12/29/17 2044 125 lb (56.7 kg)     Height 12/29/17 2044 5\' 4"  (1.626 m)     Head Circumference --      Peak Flow --      Pain Score 12/29/17 2043 0     Pain Loc --      Pain Edu? --      Excl. in [place]? --     Constitutional: Alert and oriented. Well appearing and in no acute distress. Answers questions appropriately. Eyes: Conjunctivae are normal.  EOMI. No scleral icterus. Head: Atraumatic. Nose: No congestion/rhinnorhea. Mouth/Throat: Mucous membranes are mildly dry.   Neck: No  stridor.  Supple.  No JVD.  No meningismus. Cardiovascular: Normal rate, regular rhythm. No murmurs, rubs or gallops.  Respiratory: Normal respiratory effort.  No accessory muscle use or retractions. Lungs CTAB.  No wheezes, rales or ronchi. Gastrointestinal: Soft, nontender and nondistended.  No guarding or rebound.  No peritoneal signs. Musculoskeletal: No LE edema. No ttp in the calves or palpable cords.  Negative Homan's sign. Neurologic:  A&Ox3.  Speech is clear.  Face and smile are symmetric.  EOMI.  Moves all extremities well. Skin:  Skin is warm, dry and intact. No rash noted. Psychiatric: Mood and affect are normal. Speech and behavior are normal.  Normal judgement  ____________________________________________   LABS (all labs ordered are listed, but only abnormal results are displayed)  Labs Reviewed  CBC - Abnormal; Notable for the following components:      Result Value   RBC 2.97 (*)    Hemoglobin 10.0 (*)    HCT 28.7 (*)    All other components within normal limits   COMPREHENSIVE METABOLIC PANEL - Abnormal; Notable for the following components:   Sodium 133 (*)    CO2 20 (*)    Glucose, Bld 161 (*)    BUN 28 (*)    Creatinine, Ser 1.42 (*)    Calcium 8.6 (*)    GFR calc non Af Amer 35 (*)    GFR calc Af Amer 40 (*)    All other components within normal limits  URINALYSIS, COMPLETE (UACMP) WITH MICROSCOPIC - Abnormal; Notable for the following components:   Color, Urine YELLOW (*)    APPearance HAZY (*)    Hgb urine dipstick SMALL (*)    Nitrite POSITIVE (*)    Leukocytes, UA LARGE (*)    WBC, UA >50 (*)    Bacteria, UA FEW (*)    All other components within normal limits  URINE CULTURE   ____________________________________________  EKG  ED ECG REPORT I, Eula Listen, the attending physician, personally viewed and interpreted this ECG.   Date: 12/29/2017  EKG Time: 2049  Rate: 71  Rhythm: normal sinus rhythm  Axis: normal  Intervals:none  ST&T Change: RBBB; no STEMI  ____________________________________________  RADIOLOGY  No results found.  ____________________________________________   PROCEDURES  Procedure(s) performed: None  Procedures  Critical Care performed: No ____________________________________________   INITIAL IMPRESSION / ASSESSMENT AND PLAN / ED COURSE  Pertinent labs & imaging results that were available during my care of the patient were reviewed by me and considered in my medical decision making (see chart for details).  77 y.o. female with a history of HTN, DM, presenting with generalized weakness, subjective fever, and dysuria.  Overall, the patient is hemodynamically stable.  She has no evidence of sepsis on my examination.  Will evaluate her for UTI, and get basic laboratory studies.  She will receive intravenous fluids and medications for her nausea.  Plan reevaluation for final disposition.  ----------------------------------------- 10:55 PM on  12/29/2017 -----------------------------------------  The patient's laboratory studies are suggestive of a UTI, and I have sent the urine for culture.  The patient will receive intravenous fluids and IV Rocephin here.  Her anemia is chronic and unchanged.  She has mild hyponatremia and renal insufficiency, which I have treated with 2 L of normal saline.  The patient continues to be hemodynamically stable and is feeling better at this time.  I will plan to discharge her home with close PMD follow-up for repeat creatinine, sodium and reevaluation.  The patient understands  follow-up instructions as well as return precautions.  ____________________________________________  FINAL CLINICAL IMPRESSION(S) / ED DIAGNOSES  Final diagnoses:  Acute cystitis without hematuria  Renal insufficiency  Hyponatremia         NEW MEDICATIONS STARTED DURING THIS VISIT:  New Prescriptions   CEPHALEXIN (KEFLEX) 500 MG CAPSULE    Take 1 capsule (500 mg total) by mouth 4 (four) times daily for 7 days.   ONDANSETRON (ZOFRAN ODT) 4 MG DISINTEGRATING TABLET    Take 1 tablet (4 mg total) by mouth every 8 (eight) hours as needed for nausea or vomiting.      Eula Listen, MD 12/29/17 2256

## 2017-12-29 NOTE — ED Triage Notes (Signed)
Pt brought in via ems from home with fever and weakness.  Iv in place   Pt alert

## 2018-01-01 LAB — URINE CULTURE

## 2018-01-27 ENCOUNTER — Other Ambulatory Visit: Payer: Self-pay | Admitting: Cardiovascular Disease

## 2018-02-08 ENCOUNTER — Other Ambulatory Visit: Payer: Self-pay | Admitting: Cardiovascular Disease

## 2018-02-25 ENCOUNTER — Other Ambulatory Visit: Payer: Self-pay | Admitting: Cardiovascular Disease

## 2018-04-21 DIAGNOSIS — R82998 Other abnormal findings in urine: Secondary | ICD-10-CM | POA: Diagnosis not present

## 2018-04-21 DIAGNOSIS — Z79899 Other long term (current) drug therapy: Secondary | ICD-10-CM | POA: Diagnosis not present

## 2018-04-21 DIAGNOSIS — E1129 Type 2 diabetes mellitus with other diabetic kidney complication: Secondary | ICD-10-CM | POA: Diagnosis not present

## 2018-04-21 DIAGNOSIS — E890 Postprocedural hypothyroidism: Secondary | ICD-10-CM | POA: Diagnosis not present

## 2018-04-26 DIAGNOSIS — E1151 Type 2 diabetes mellitus with diabetic peripheral angiopathy without gangrene: Secondary | ICD-10-CM | POA: Diagnosis not present

## 2018-04-26 DIAGNOSIS — K219 Gastro-esophageal reflux disease without esophagitis: Secondary | ICD-10-CM | POA: Diagnosis not present

## 2018-04-26 DIAGNOSIS — E1129 Type 2 diabetes mellitus with other diabetic kidney complication: Secondary | ICD-10-CM | POA: Diagnosis not present

## 2018-04-26 DIAGNOSIS — I129 Hypertensive chronic kidney disease with stage 1 through stage 4 chronic kidney disease, or unspecified chronic kidney disease: Secondary | ICD-10-CM | POA: Diagnosis not present

## 2018-04-26 DIAGNOSIS — E890 Postprocedural hypothyroidism: Secondary | ICD-10-CM | POA: Diagnosis not present

## 2018-04-26 DIAGNOSIS — I700 Atherosclerosis of aorta: Secondary | ICD-10-CM | POA: Diagnosis not present

## 2018-04-26 DIAGNOSIS — Z0000 Encounter for general adult medical examination without abnormal findings: Secondary | ICD-10-CM | POA: Diagnosis not present

## 2018-04-26 DIAGNOSIS — N183 Chronic kidney disease, stage 3 (moderate): Secondary | ICD-10-CM | POA: Diagnosis not present

## 2018-04-26 DIAGNOSIS — K589 Irritable bowel syndrome without diarrhea: Secondary | ICD-10-CM | POA: Diagnosis not present

## 2018-04-26 DIAGNOSIS — Z794 Long term (current) use of insulin: Secondary | ICD-10-CM | POA: Diagnosis not present

## 2018-04-26 DIAGNOSIS — I2510 Atherosclerotic heart disease of native coronary artery without angina pectoris: Secondary | ICD-10-CM | POA: Diagnosis not present

## 2018-04-26 DIAGNOSIS — I209 Angina pectoris, unspecified: Secondary | ICD-10-CM | POA: Diagnosis not present

## 2018-05-31 DIAGNOSIS — H2513 Age-related nuclear cataract, bilateral: Secondary | ICD-10-CM | POA: Diagnosis not present

## 2018-07-07 ENCOUNTER — Other Ambulatory Visit: Payer: Self-pay

## 2018-07-07 ENCOUNTER — Telehealth: Payer: Self-pay | Admitting: Cardiovascular Disease

## 2018-07-07 MED ORDER — NITROGLYCERIN 0.4 MG SL SUBL: 0.4000 mg | SUBLINGUAL_TABLET | SUBLINGUAL | 0 refills | Status: DC | PRN

## 2018-07-07 NOTE — Telephone Encounter (Signed)
Requested Prescriptions   Signed Prescriptions Disp Refills  . nitroGLYCERIN (NITROSTAT) 0.4 MG SL tablet 25 tablet 0    Sig: Place 1 tablet (0.4 mg total) under the tongue every 5 (five) minutes as needed for chest pain.    Authorizing Provider: Kathlyn Sacramento A    Ordering User: Janan Ridge

## 2018-07-07 NOTE — Telephone Encounter (Signed)
° °*  STAT* If patient is at the pharmacy, call can be transferred to refill team.   1. Which medications need to be refilled? (please list name of each medication and dose if known)  NitroGLYCERIN  2. Which pharmacy/location (including street and city if local pharmacy) is medication to be sent to? [facility] on [address]  3. Do they need a 30 day or 90 day supply? 90 day

## 2018-07-20 DIAGNOSIS — I129 Hypertensive chronic kidney disease with stage 1 through stage 4 chronic kidney disease, or unspecified chronic kidney disease: Secondary | ICD-10-CM | POA: Diagnosis not present

## 2018-07-20 DIAGNOSIS — R2689 Other abnormalities of gait and mobility: Secondary | ICD-10-CM | POA: Diagnosis not present

## 2018-07-20 DIAGNOSIS — K921 Melena: Secondary | ICD-10-CM | POA: Diagnosis not present

## 2018-07-20 DIAGNOSIS — I209 Angina pectoris, unspecified: Secondary | ICD-10-CM | POA: Diagnosis not present

## 2018-07-20 DIAGNOSIS — Z6824 Body mass index (BMI) 24.0-24.9, adult: Secondary | ICD-10-CM | POA: Diagnosis not present

## 2018-07-20 DIAGNOSIS — R42 Dizziness and giddiness: Secondary | ICD-10-CM | POA: Diagnosis not present

## 2018-07-20 DIAGNOSIS — N390 Urinary tract infection, site not specified: Secondary | ICD-10-CM | POA: Diagnosis not present

## 2018-07-22 ENCOUNTER — Telehealth: Payer: Self-pay

## 2018-07-22 NOTE — Telephone Encounter (Signed)
SENT REFERRAL TO SCHEDULING FILED NOTES 

## 2018-07-29 ENCOUNTER — Ambulatory Visit: Payer: Medicare Other | Admitting: Cardiovascular Disease

## 2018-07-30 ENCOUNTER — Telehealth: Payer: Self-pay | Admitting: Cardiovascular Disease

## 2018-07-30 NOTE — Telephone Encounter (Signed)
Patient had some chest pain off and on for about 2 weeks and took nitro which did help take the pain away. EMS came out today and they told her the EKG was normal. She states that patients PCP had some labs done and they were supposed to send those over to [place] for cardiology review. She will call them and have them sent over for Dr. Fletcher Anon to review. She does have upcoming appointment with Dr. Fletcher Anon and reviewed signs and symptoms to monitor for that would require immediate evaluation in the ED. She verbalized understanding with no further questions at this time.

## 2018-07-30 NOTE — Telephone Encounter (Signed)
Pt c/o of Chest Pain: STAT if CP now or developed within 24 hours  1. Are you having CP right now? No   2. Are you experiencing any other symptoms (ex. SOB, nausea, vomiting, sweating)? Dizzy lightheaded disoriented headache   3. How long have you been experiencing CP? Few days   4. Is your CP continuous or coming and going? Comes and goes a few days   5. Have you taken Nitroglycerin? Yes made patient sick   Patient daughter called 911 EKG was normal and declined to go the er but worried she may need eval  Patient recently had labs and wants to know if they are abnormal  ?

## 2018-08-01 ENCOUNTER — Other Ambulatory Visit: Payer: Self-pay | Admitting: Cardiovascular Disease

## 2018-08-09 DIAGNOSIS — H2512 Age-related nuclear cataract, left eye: Secondary | ICD-10-CM | POA: Diagnosis not present

## 2018-08-10 ENCOUNTER — Ambulatory Visit: Payer: Medicare Other | Admitting: Cardiovascular Disease

## 2018-08-11 ENCOUNTER — Encounter: Payer: Self-pay | Admitting: *Deleted

## 2018-08-17 ENCOUNTER — Other Ambulatory Visit: Payer: Self-pay

## 2018-08-17 ENCOUNTER — Ambulatory Visit: Payer: Medicare Other | Admitting: Anesthesiology

## 2018-08-17 ENCOUNTER — Encounter: Payer: Self-pay | Admitting: Anesthesiology

## 2018-08-17 ENCOUNTER — Encounter
Admission: RE | Disposition: A | Discharge status: Home / Self Care | Payer: Self-pay | Source: Home / Self Care | Attending: Ophthalmology

## 2018-08-17 ENCOUNTER — Ambulatory Visit
Admission: RE | Admit: 2018-08-17 | Discharge: 2018-08-17 | Disposition: A | Discharge status: Home / Self Care | Payer: Medicare Other | Attending: Ophthalmology | Admitting: Ophthalmology

## 2018-08-17 DIAGNOSIS — Z955 Presence of coronary angioplasty implant and graft: Secondary | ICD-10-CM | POA: Insufficient documentation

## 2018-08-17 DIAGNOSIS — Z881 Allergy status to other antibiotic agents status: Secondary | ICD-10-CM | POA: Insufficient documentation

## 2018-08-17 DIAGNOSIS — Z79890 Hormone replacement therapy (postmenopausal): Secondary | ICD-10-CM | POA: Insufficient documentation

## 2018-08-17 DIAGNOSIS — I252 Old myocardial infarction: Secondary | ICD-10-CM | POA: Diagnosis not present

## 2018-08-17 DIAGNOSIS — Z888 Allergy status to other drugs, medicaments and biological substances status: Secondary | ICD-10-CM | POA: Diagnosis not present

## 2018-08-17 DIAGNOSIS — H409 Unspecified glaucoma: Secondary | ICD-10-CM | POA: Insufficient documentation

## 2018-08-17 DIAGNOSIS — E890 Postprocedural hypothyroidism: Secondary | ICD-10-CM | POA: Diagnosis not present

## 2018-08-17 DIAGNOSIS — Z951 Presence of aortocoronary bypass graft: Secondary | ICD-10-CM | POA: Insufficient documentation

## 2018-08-17 DIAGNOSIS — Z79899 Other long term (current) drug therapy: Secondary | ICD-10-CM | POA: Diagnosis not present

## 2018-08-17 DIAGNOSIS — E1136 Type 2 diabetes mellitus with diabetic cataract: Secondary | ICD-10-CM | POA: Diagnosis not present

## 2018-08-17 DIAGNOSIS — E039 Hypothyroidism, unspecified: Secondary | ICD-10-CM | POA: Diagnosis not present

## 2018-08-17 DIAGNOSIS — I2510 Atherosclerotic heart disease of native coronary artery without angina pectoris: Secondary | ICD-10-CM | POA: Insufficient documentation

## 2018-08-17 DIAGNOSIS — E1151 Type 2 diabetes mellitus with diabetic peripheral angiopathy without gangrene: Secondary | ICD-10-CM | POA: Diagnosis not present

## 2018-08-17 DIAGNOSIS — F419 Anxiety disorder, unspecified: Secondary | ICD-10-CM | POA: Insufficient documentation

## 2018-08-17 DIAGNOSIS — I10 Essential (primary) hypertension: Secondary | ICD-10-CM | POA: Diagnosis not present

## 2018-08-17 DIAGNOSIS — E785 Hyperlipidemia, unspecified: Secondary | ICD-10-CM | POA: Diagnosis not present

## 2018-08-17 DIAGNOSIS — Z794 Long term (current) use of insulin: Secondary | ICD-10-CM | POA: Diagnosis not present

## 2018-08-17 DIAGNOSIS — H2512 Age-related nuclear cataract, left eye: Secondary | ICD-10-CM | POA: Diagnosis not present

## 2018-08-17 DIAGNOSIS — K219 Gastro-esophageal reflux disease without esophagitis: Secondary | ICD-10-CM | POA: Insufficient documentation

## 2018-08-17 HISTORY — DX: Dizziness and giddiness: R42

## 2018-08-17 HISTORY — DX: Dyspnea, unspecified: R06.00

## 2018-08-17 HISTORY — DX: Angina pectoris, unspecified: I20.9

## 2018-08-17 HISTORY — PX: CATARACT EXTRACTION W/PHACO: SHX586

## 2018-08-17 LAB — GLUCOSE, CAPILLARY: Glucose-Capillary: 145 mg/dL — ABNORMAL HIGH (ref 70–99)

## 2018-08-17 SURGERY — PHACOEMULSIFICATION, CATARACT, WITH IOL INSERTION
Anesthesia: Monitor Anesthesia Care | Site: Eye | Laterality: Left

## 2018-08-17 MED ORDER — ARMC OPHTHALMIC DILATING DROPS
OPHTHALMIC | Status: AC
  Filled 2018-08-17: qty 0.5

## 2018-08-17 MED ORDER — TETRACAINE HCL 0.5 % OP SOLN
OPHTHALMIC | Status: AC
  Filled 2018-08-17: qty 4

## 2018-08-17 MED ORDER — MIDAZOLAM HCL 2 MG/2ML IJ SOLN
INTRAMUSCULAR | Status: DC | PRN
  Administered 2018-08-17 (×2): 1 mg via INTRAVENOUS

## 2018-08-17 MED ORDER — MOXIFLOXACIN HCL 0.5 % OP SOLN
OPHTHALMIC | Status: DC | PRN
  Administered 2018-08-17: .2 mL via OPHTHALMIC

## 2018-08-17 MED ORDER — ONDANSETRON HCL 4 MG/2ML IJ SOLN
INTRAMUSCULAR | Status: AC
  Filled 2018-08-17: qty 2

## 2018-08-17 MED ORDER — ARMC OPHTHALMIC DILATING DROPS
1.0000 "application " | OPHTHALMIC | Status: AC
  Administered 2018-08-17 (×3): 1 via OPHTHALMIC

## 2018-08-17 MED ORDER — EPINEPHRINE PF 1 MG/ML IJ SOLN
INTRAOCULAR | Status: DC | PRN
  Administered 2018-08-17: 1 mL via OPHTHALMIC

## 2018-08-17 MED ORDER — ONDANSETRON HCL 4 MG/2ML IJ SOLN
INTRAMUSCULAR | Status: DC | PRN
  Administered 2018-08-17: 4 mg via INTRAVENOUS

## 2018-08-17 MED ORDER — POVIDONE-IODINE 5 % OP SOLN
OPHTHALMIC | Status: DC | PRN
  Administered 2018-08-17: 1 via OPHTHALMIC

## 2018-08-17 MED ORDER — EPINEPHRINE PF 1 MG/ML IJ SOLN
INTRAMUSCULAR | Status: AC
  Filled 2018-08-17: qty 1

## 2018-08-17 MED ORDER — MIDAZOLAM HCL 2 MG/2ML IJ SOLN
INTRAMUSCULAR | Status: AC
  Filled 2018-08-17: qty 2

## 2018-08-17 MED ORDER — NA CHONDROIT SULF-NA HYALURON 40-17 MG/ML IO SOLN
INTRAOCULAR | Status: AC
  Filled 2018-08-17: qty 1

## 2018-08-17 MED ORDER — TETRACAINE HCL 0.5 % OP SOLN
1.0000 [drp] | OPHTHALMIC | Status: AC | PRN
  Administered 2018-08-17 (×3): 1 [drp] via OPHTHALMIC

## 2018-08-17 MED ORDER — SODIUM CHLORIDE 0.9 % IV SOLN
INTRAVENOUS | Status: DC
  Administered 2018-08-17: 08:00:00 via INTRAVENOUS

## 2018-08-17 MED ORDER — LIDOCAINE HCL (PF) 4 % IJ SOLN
INTRAMUSCULAR | Status: AC
  Filled 2018-08-17: qty 5

## 2018-08-17 MED ORDER — LIDOCAINE HCL (PF) 4 % IJ SOLN
INTRAOCULAR | Status: DC | PRN
  Administered 2018-08-17: 2 mL via OPHTHALMIC

## 2018-08-17 MED ORDER — POVIDONE-IODINE 5 % OP SOLN
OPHTHALMIC | Status: AC
  Filled 2018-08-17: qty 30

## 2018-08-17 MED ORDER — CARBACHOL 0.01 % IO SOLN
INTRAOCULAR | Status: DC | PRN
  Administered 2018-08-17: .5 mL via INTRAOCULAR

## 2018-08-17 MED ORDER — NA CHONDROIT SULF-NA HYALURON 40-17 MG/ML IO SOLN
INTRAOCULAR | Status: DC | PRN
  Administered 2018-08-17: 1 mL via INTRAOCULAR

## 2018-08-17 MED ORDER — MOXIFLOXACIN HCL 0.5 % OP SOLN
OPHTHALMIC | Status: AC
  Filled 2018-08-17: qty 3

## 2018-08-17 MED ORDER — MOXIFLOXACIN HCL 0.5 % OP SOLN: 1.0000 [drp] | OPHTHALMIC | Status: DC | PRN

## 2018-08-17 SURGICAL SUPPLY — 16 items
GLOVE BIO SURGEON STRL SZ8 (GLOVE) ×3 IMPLANT
GLOVE BIOGEL M 6.5 STRL (GLOVE) ×3 IMPLANT
GLOVE SURG LX 8.0 MICRO (GLOVE) ×2
GLOVE SURG LX STRL 8.0 MICRO (GLOVE) ×1 IMPLANT
GOWN STRL REUS W/ TWL LRG LVL3 (GOWN DISPOSABLE) ×2 IMPLANT
GOWN STRL REUS W/TWL LRG LVL3 (GOWN DISPOSABLE) ×6
LABEL CATARACT MEDS ST (LABEL) ×3 IMPLANT
LENS IOL TECNIS ITEC 20.0 (Intraocular Lens) ×2 IMPLANT
PACK CATARACT (MISCELLANEOUS) ×3 IMPLANT
PACK CATARACT BRASINGTON LX (MISCELLANEOUS) ×3 IMPLANT
PACK EYE AFTER SURG (MISCELLANEOUS) ×3 IMPLANT
SOL BSS BAG (MISCELLANEOUS) ×3
SOLUTION BSS BAG (MISCELLANEOUS) ×1 IMPLANT
SYR 5ML LL (SYRINGE) ×3 IMPLANT
WATER STERILE IRR 250ML POUR (IV SOLUTION) ×3 IMPLANT
WIPE NON LINTING 3.25X3.25 (MISCELLANEOUS) ×3 IMPLANT

## 2018-08-17 NOTE — Anesthesia Post-op Follow-up Note (Signed)
Anesthesia QCDR form completed.        

## 2018-08-17 NOTE — Transfer of Care (Signed)
Immediate Anesthesia Transfer of Care Note  Patient: Angela Barrett  Procedure(s) Performed: CATARACT EXTRACTION PHACO AND INTRAOCULAR LENS PLACEMENT (IOC) LEFT, DIABETIC (Left Eye)  Patient Location: Short Stay  Anesthesia Type:MAC  Level of Consciousness: awake, alert , oriented and patient cooperative  Airway & Oxygen Therapy: Patient Spontanous Breathing  Post-op Assessment: Report given to RN and Post -op Vital signs reviewed and stable  Post vital signs: Reviewed and stable  Last Vitals:  Vitals Value Taken Time  BP 140/49 08/17/2018  9:28 AM  Temp 36.3 C 08/17/2018  9:28 AM  Pulse 52 08/17/2018  9:28 AM  Resp 16 08/17/2018  9:28 AM  SpO2 100 % 08/17/2018  9:28 AM    Last Pain:  Vitals:   08/17/18 0928  TempSrc: Temporal  PainSc: 0-No pain         Complications: No apparent anesthesia complications

## 2018-08-17 NOTE — Anesthesia Preprocedure Evaluation (Signed)
Anesthesia Evaluation  Patient identified by MRN, date of birth, ID band Patient awake    Reviewed: Allergy & Precautions, H&P , NPO status , Patient's Chart, lab work & pertinent test results  History of Anesthesia Complications (+) PONV and history of anesthetic complications  Airway Mallampati: III  TM Distance: <3 FB Neck ROM: limited    Dental  (+) Chipped, Upper Dentures, Missing, Partial Lower   Pulmonary neg shortness of breath,           Cardiovascular Exercise Tolerance: Good hypertension, (-) angina+ CAD, + Past MI, + Cardiac Stents, + CABG and + Peripheral Vascular Disease  (-) DOE      Neuro/Psych  Headaches, PSYCHIATRIC DISORDERS    GI/Hepatic Neg liver ROS, GERD  Medicated and Controlled,  Endo/Other  diabetes, Type 2Hypothyroidism   Renal/GU      Musculoskeletal  (+) Arthritis ,   Abdominal   Peds  Hematology negative hematology ROS (+)   Anesthesia Other Findings Past Medical History: No date: Anemia     Comment:  "after OHS in 01/2013" (05/25/2013) No date: Anginal pain ([place]) No date: Anxiety No date: Arthritis     Comment:  "joints" (05/25/2013) No date: Coronary artery disease     Comment:  a. nl myoview ~ 2006;  b. 01/2013 s/p MI and CABG x 4               (VG->D2, VG->RPDA->OM3, LIMA->LAD;  c. 05/2013 Cath/PCI:               LM nl, LAD 60-70p/m, D1 70p, LCX 1100m OM2 60p, RCA               50-60p/814m3.0x33 Xience DES), VG->D2 60, VG->RPDA/OM3               100, LIMA->LAD nl w 70 dLAD, EF 50%. No date: Depression No date: Diabetes mellitus, type 2 ([facility]    Comment:  "dx'd 1992" (05/25/2013) No date: Dyspnea     Comment:  DOE No date: GERD (gastroesophageal reflux disease) No date: Glaucoma     Comment:  bilateral No date: Headache(784.0)     Comment:  "monthly" (05/25/2013) 1985: History of pneumonia No date: Hyperlipidemia No date: Hypertension No date: Hypothyroidism No  date: IBS (irritable bowel syndrome) 03/24/2012: Lymphocytosis No date: OA (osteoarthritis) No date: PONV (postoperative nausea and vomiting) No date: Vertigo  Past Surgical History: 2005: BLADDER SUSPENSION 01/2013: CARDIAC CATHETERIZATION 1990-1992: CARPAL TUNNEL RELEASE; Bilateral 11/20/2015: COLONOSCOPY WITH PROPOFOL; N/A     Comment:  Procedure: COLONOSCOPY WITH PROPOFOL;  Surgeon: DaLucilla LameMD;  Location: [facility] ENDOSCOPY;  Service: Endoscopy;              Laterality: N/A; 05/25/2013: CORONARY ANGIOPLASTY WITH STENT PLACEMENT     Comment:  "1" (05/25/2013) 01/07/2013: CORONARY ARTERY BYPASS GRAFT; N/A     Comment:  Procedure: CORONARY ARTERY BYPASS GRAFTING (CABG);                Surgeon: StMelrose NakayamaMD;  [facility]               Service: Open Heart Surgery;  Laterality: N/A; 11/20/2015: ESOPHAGOGASTRODUODENOSCOPY (EGD) WITH PROPOFOL; N/A     Comment:  Procedure: ESOPHAGOGASTRODUODENOSCOPY (EGD) WITH               PROPOFOL;  Surgeon: DaLucilla LameMD;  [facility];  Service: Endoscopy;  Laterality: N/A; 2009: HAMMER TOE SURGERY; Right 1993-1996: KNEE ARTHROSCOPY; Bilateral 01/05/2013: LEFT HEART CATHETERIZATION WITH CORONARY ANGIOGRAM; N/A     Comment:  Procedure: LEFT HEART CATHETERIZATION WITH CORONARY               ANGIOGRAM;  Surgeon: Wellington Hampshire, MD;  Location: [place]               CATH LAB;  Service: Cardiovascular;  Laterality: N/A; 05/25/2013: LEFT HEART CATHETERIZATION WITH CORONARY/GRAFT ANGIOGRAM;  N/A     Comment:  Procedure: LEFT HEART CATHETERIZATION WITH               Beatrix Fetters;  Surgeon: Wellington Hampshire, MD;              Location: [place] CATH LAB;  Service: Cardiovascular;                Laterality: N/A; 05/25/2013: PERCUTANEOUS CORONARY STENT INTERVENTION (PCI-S)     Comment:  Procedure: PERCUTANEOUS CORONARY STENT INTERVENTION               (PCI-S);  Surgeon: Wellington Hampshire, MD;  Location:  [facility]               CATH LAB;  Service: Cardiovascular;; 2010: THYROIDECTOMY 1974: VAGINAL HYSTERECTOMY  BMI    Body Mass Index:  23.17 kg/m      Reproductive/Obstetrics negative OB ROS                             Anesthesia Physical Anesthesia Plan  ASA: III  Anesthesia Plan: MAC   Post-op Pain Management:    Induction: Intravenous  PONV Risk Score and Plan:   Airway Management Planned: Natural Airway and Nasal Cannula  Additional Equipment:   Intra-op Plan:   Post-operative Plan:   Informed Consent: I have reviewed the patients History and Physical, chart, labs and discussed the procedure including the risks, benefits and alternatives for the proposed anesthesia with the patient or authorized representative who has indicated his/her understanding and acceptance.   Dental Advisory Given  Plan Discussed with: Anesthesiologist, CRNA and Surgeon  Anesthesia Plan Comments: (Patient consented for risks of anesthesia including but not limited to:  - adverse reactions to medications - damage to teeth, lips or other oral mucosa - sore throat or hoarseness - Damage to heart, brain, lungs or loss of life  Patient voiced understanding.)        Anesthesia Quick Evaluation

## 2018-08-17 NOTE — Anesthesia Postprocedure Evaluation (Signed)
Anesthesia Post Note  Patient: Angela Barrett  Procedure(s) Performed: CATARACT EXTRACTION PHACO AND INTRAOCULAR LENS PLACEMENT (IOC) LEFT, DIABETIC (Left Eye)  Patient location during evaluation: Short Stay Anesthesia Type: MAC Level of consciousness: awake, oriented, awake and alert and patient cooperative Pain management: satisfactory to patient Vital Signs Assessment: post-procedure vital signs reviewed and stable Respiratory status: spontaneous breathing, nonlabored ventilation and respiratory function stable Cardiovascular status: blood pressure returned to baseline and stable Postop Assessment: no headache and no apparent nausea or vomiting Anesthetic complications: no     Last Vitals:  Vitals:   08/17/18 0803 08/17/18 0928  BP: 122/60 (!) 140/49  Pulse: (!) 54 (!) 52  Resp: 16 16  Temp: 36.7 C (!) 36.3 C  SpO2: 100% 100%    Last Pain:  Vitals:   08/17/18 0928  TempSrc: Temporal  PainSc: 0-No pain                 Eben Burow

## 2018-08-17 NOTE — H&P (Signed)
All labs reviewed. Abnormal studies sent to patients PCP when indicated.  Previous H&P reviewed, patient examined, there are NO CHANGES.  Angela Matthew Porfilio1/14/20208:55 AM

## 2018-08-17 NOTE — Discharge Instructions (Addendum)
Eye Surgery Discharge Instructions  Expect mild scratchy sensation or mild soreness. DO NOT RUB YOUR EYE!  The day of surgery:  Minimal physical activity, but bed rest is not required  No reading, computer work, or close hand work  No bending, lifting, or straining.  May watch TV  For 24 hours:  No driving, legal decisions, or alcoholic beverages  Safety precautions  Eat anything you prefer: It is better to start with liquids, then soup then solid foods.  Solar shield eyeglasses should be worn for comfort in the sunlight/patch while sleeping  Resume all regular medications including aspirin or Coumadin if these were discontinued prior to surgery. You may shower, bathe, shave, or wash your hair. Tylenol may be taken for mild discomfort. FOLLOW DR. PORFILIO'S EYE DROP INSTRUCTION SHEET AS REVIEWED.  Call your doctor if you experience significant pain, nausea, or vomiting, fever > 101 or other signs of infection. [phone] or [phone] Specific instructions:  Follow-up Information    Birder Robson, MD Follow up.   Specialty:  Ophthalmology Why:  08/18/18 @ 10:00 am Contact information: [address] [place] [address] [phone]

## 2018-08-17 NOTE — Op Note (Signed)
PREOPERATIVE DIAGNOSIS:  Nuclear sclerotic cataract of the left eye.   POSTOPERATIVE DIAGNOSIS:  Nuclear sclerotic cataract of the left eye.   OPERATIVE PROCEDURE: Procedure(s): CATARACT EXTRACTION PHACO AND INTRAOCULAR LENS PLACEMENT (IOC) LEFT, DIABETIC   SURGEON:  Birder Robson, MD.   ANESTHESIA:  Anesthesiologist: Piscitello, Precious Haws, MD CRNA: Eben Burow, CRNA  1.      Managed anesthesia care. 2.     0.8ml of Shugarcaine was instilled following the paracentesis   COMPLICATIONS:  None.   TECHNIQUE:   Stop and chop   DESCRIPTION OF PROCEDURE:  The patient was examined and consented in the preoperative holding area where the aforementioned topical anesthesia was applied to the left eye and then brought back to the Operating Room where the left eye was prepped and draped in the usual sterile ophthalmic fashion and a lid speculum was placed. A paracentesis was created with the side port blade and the anterior chamber was filled with viscoelastic. A near clear corneal incision was performed with the steel keratome. A continuous curvilinear capsulorrhexis was performed with a cystotome followed by the capsulorrhexis forceps. Hydrodissection and hydrodelineation were carried out with BSS on a blunt cannula. The lens was removed in a stop and chop  technique and the remaining cortical material was removed with the irrigation-aspiration handpiece. The capsular bag was inflated with viscoelastic and the Technis ZCB00 lens was placed in the capsular bag without complication. The remaining viscoelastic was removed from the eye with the irrigation-aspiration handpiece. The wounds were hydrated. The anterior chamber was flushed with Miostat and the eye was inflated to physiologic pressure. 0.24ml Vigamox was placed in the anterior chamber. The wounds were found to be water tight. The eye was dressed with Vigamox. The patient was given protective glasses to wear throughout the day and a shield with  which to sleep tonight. The patient was also given drops with which to begin a drop regimen today and will follow-up with me in one day. Implant Name Type Inv. Item Serial No. Manufacturer Lot No. LRB No. Used  LENS IOL DIOP 20.0 - [id number] Intraocular Lens LENS IOL DIOP 20.0 [id number] AMO  Left 1    Procedure(s) with comments: CATARACT EXTRACTION PHACO AND INTRAOCULAR LENS PLACEMENT (IOC) LEFT, DIABETIC (Left) - [place] 01:01 CDE 8.51 Fluid pack lot # [id number]  Electronically signed: Birder Robson 08/17/2018 9:26 AM

## 2018-08-17 NOTE — OR Nursing (Signed)
Denies nausea at discharge

## 2018-08-18 ENCOUNTER — Encounter: Payer: Self-pay | Admitting: Ophthalmology

## 2018-08-24 DIAGNOSIS — H2511 Age-related nuclear cataract, right eye: Secondary | ICD-10-CM | POA: Diagnosis not present

## 2018-08-31 ENCOUNTER — Encounter: Payer: Self-pay | Admitting: *Deleted

## 2018-09-04 ENCOUNTER — Other Ambulatory Visit: Payer: Self-pay | Admitting: Cardiovascular Disease

## 2018-09-06 DIAGNOSIS — K589 Irritable bowel syndrome without diarrhea: Secondary | ICD-10-CM | POA: Diagnosis not present

## 2018-09-06 DIAGNOSIS — I209 Angina pectoris, unspecified: Secondary | ICD-10-CM | POA: Diagnosis not present

## 2018-09-06 DIAGNOSIS — Z794 Long term (current) use of insulin: Secondary | ICD-10-CM | POA: Diagnosis not present

## 2018-09-06 DIAGNOSIS — I2510 Atherosclerotic heart disease of native coronary artery without angina pectoris: Secondary | ICD-10-CM | POA: Diagnosis not present

## 2018-09-06 DIAGNOSIS — I129 Hypertensive chronic kidney disease with stage 1 through stage 4 chronic kidney disease, or unspecified chronic kidney disease: Secondary | ICD-10-CM | POA: Diagnosis not present

## 2018-09-06 DIAGNOSIS — E890 Postprocedural hypothyroidism: Secondary | ICD-10-CM | POA: Diagnosis not present

## 2018-09-06 DIAGNOSIS — K219 Gastro-esophageal reflux disease without esophagitis: Secondary | ICD-10-CM | POA: Diagnosis not present

## 2018-09-06 DIAGNOSIS — I700 Atherosclerosis of aorta: Secondary | ICD-10-CM | POA: Diagnosis not present

## 2018-09-06 DIAGNOSIS — E1151 Type 2 diabetes mellitus with diabetic peripheral angiopathy without gangrene: Secondary | ICD-10-CM | POA: Diagnosis not present

## 2018-09-06 DIAGNOSIS — E1129 Type 2 diabetes mellitus with other diabetic kidney complication: Secondary | ICD-10-CM | POA: Diagnosis not present

## 2018-09-06 DIAGNOSIS — M1990 Unspecified osteoarthritis, unspecified site: Secondary | ICD-10-CM | POA: Diagnosis not present

## 2018-09-06 DIAGNOSIS — N183 Chronic kidney disease, stage 3 (moderate): Secondary | ICD-10-CM | POA: Diagnosis not present

## 2018-09-14 ENCOUNTER — Ambulatory Visit
Admission: RE | Admit: 2018-09-14 | Discharge: 2018-09-14 | Disposition: A | Discharge status: Home / Self Care | Payer: Medicare Other | Attending: Ophthalmology | Admitting: Ophthalmology

## 2018-09-14 ENCOUNTER — Ambulatory Visit: Payer: Medicare Other | Admitting: Anesthesiology

## 2018-09-14 ENCOUNTER — Encounter: Payer: Self-pay | Admitting: *Deleted

## 2018-09-14 ENCOUNTER — Encounter
Admission: RE | Disposition: A | Discharge status: Home / Self Care | Payer: Self-pay | Source: Home / Self Care | Attending: Ophthalmology

## 2018-09-14 DIAGNOSIS — H409 Unspecified glaucoma: Secondary | ICD-10-CM | POA: Diagnosis not present

## 2018-09-14 DIAGNOSIS — Z7982 Long term (current) use of aspirin: Secondary | ICD-10-CM | POA: Diagnosis not present

## 2018-09-14 DIAGNOSIS — K219 Gastro-esophageal reflux disease without esophagitis: Secondary | ICD-10-CM | POA: Diagnosis not present

## 2018-09-14 DIAGNOSIS — E785 Hyperlipidemia, unspecified: Secondary | ICD-10-CM | POA: Insufficient documentation

## 2018-09-14 DIAGNOSIS — I10 Essential (primary) hypertension: Secondary | ICD-10-CM | POA: Diagnosis not present

## 2018-09-14 DIAGNOSIS — Z955 Presence of coronary angioplasty implant and graft: Secondary | ICD-10-CM | POA: Diagnosis not present

## 2018-09-14 DIAGNOSIS — Z794 Long term (current) use of insulin: Secondary | ICD-10-CM | POA: Diagnosis not present

## 2018-09-14 DIAGNOSIS — I252 Old myocardial infarction: Secondary | ICD-10-CM | POA: Diagnosis not present

## 2018-09-14 DIAGNOSIS — F419 Anxiety disorder, unspecified: Secondary | ICD-10-CM | POA: Diagnosis not present

## 2018-09-14 DIAGNOSIS — H2511 Age-related nuclear cataract, right eye: Secondary | ICD-10-CM | POA: Insufficient documentation

## 2018-09-14 DIAGNOSIS — I2510 Atherosclerotic heart disease of native coronary artery without angina pectoris: Secondary | ICD-10-CM | POA: Insufficient documentation

## 2018-09-14 DIAGNOSIS — Z951 Presence of aortocoronary bypass graft: Secondary | ICD-10-CM | POA: Insufficient documentation

## 2018-09-14 DIAGNOSIS — I25110 Atherosclerotic heart disease of native coronary artery with unstable angina pectoris: Secondary | ICD-10-CM | POA: Diagnosis not present

## 2018-09-14 DIAGNOSIS — E1151 Type 2 diabetes mellitus with diabetic peripheral angiopathy without gangrene: Secondary | ICD-10-CM | POA: Diagnosis not present

## 2018-09-14 DIAGNOSIS — Z79899 Other long term (current) drug therapy: Secondary | ICD-10-CM | POA: Diagnosis not present

## 2018-09-14 DIAGNOSIS — E1136 Type 2 diabetes mellitus with diabetic cataract: Secondary | ICD-10-CM | POA: Insufficient documentation

## 2018-09-14 DIAGNOSIS — E039 Hypothyroidism, unspecified: Secondary | ICD-10-CM | POA: Diagnosis not present

## 2018-09-14 HISTORY — PX: CATARACT EXTRACTION W/PHACO: SHX586

## 2018-09-14 HISTORY — DX: History of falling: Z91.81

## 2018-09-14 HISTORY — DX: Unspecified abnormalities of gait and mobility: R26.9

## 2018-09-14 HISTORY — DX: Personal history of other diseases of the digestive system: Z87.19

## 2018-09-14 LAB — GLUCOSE, CAPILLARY: Glucose-Capillary: 106 mg/dL — ABNORMAL HIGH (ref 70–99)

## 2018-09-14 SURGERY — PHACOEMULSIFICATION, CATARACT, WITH IOL INSERTION
Anesthesia: Monitor Anesthesia Care | Site: Eye | Laterality: Right

## 2018-09-14 MED ORDER — MOXIFLOXACIN HCL 0.5 % OP SOLN
OPHTHALMIC | Status: AC
  Filled 2018-09-14: qty 3

## 2018-09-14 MED ORDER — EPINEPHRINE PF 1 MG/ML IJ SOLN
INTRAMUSCULAR | Status: AC
  Filled 2018-09-14: qty 1

## 2018-09-14 MED ORDER — MOXIFLOXACIN HCL 0.5 % OP SOLN
OPHTHALMIC | Status: DC | PRN
  Administered 2018-09-14: .2 mL via OPHTHALMIC

## 2018-09-14 MED ORDER — ARMC OPHTHALMIC DILATING DROPS
1.0000 "application " | OPHTHALMIC | Status: AC
  Administered 2018-09-14 (×3): 1 via OPHTHALMIC

## 2018-09-14 MED ORDER — NA CHONDROIT SULF-NA HYALURON 40-17 MG/ML IO SOLN
INTRAOCULAR | Status: DC | PRN
  Administered 2018-09-14: 1 mL via INTRAOCULAR

## 2018-09-14 MED ORDER — EPINEPHRINE PF 1 MG/ML IJ SOLN
INTRAOCULAR | Status: DC | PRN
  Administered 2018-09-14: 1 mL via OPHTHALMIC

## 2018-09-14 MED ORDER — FENTANYL CITRATE (PF) 100 MCG/2ML IJ SOLN
INTRAMUSCULAR | Status: DC | PRN
  Administered 2018-09-14 (×2): 50 ug via INTRAVENOUS

## 2018-09-14 MED ORDER — LIDOCAINE HCL (PF) 4 % IJ SOLN
INTRAMUSCULAR | Status: AC
  Filled 2018-09-14: qty 5

## 2018-09-14 MED ORDER — MIDAZOLAM HCL 2 MG/2ML IJ SOLN
INTRAMUSCULAR | Status: AC
  Filled 2018-09-14: qty 2

## 2018-09-14 MED ORDER — POVIDONE-IODINE 5 % OP SOLN
OPHTHALMIC | Status: DC | PRN
  Administered 2018-09-14: 1 via OPHTHALMIC

## 2018-09-14 MED ORDER — TETRACAINE HCL 0.5 % OP SOLN
1.0000 [drp] | OPHTHALMIC | Status: AC | PRN
  Administered 2018-09-14 (×3): 1 [drp] via OPHTHALMIC

## 2018-09-14 MED ORDER — TETRACAINE HCL 0.5 % OP SOLN
OPHTHALMIC | Status: AC
  Administered 2018-09-14: 1 [drp] via OPHTHALMIC
  Filled 2018-09-14: qty 4

## 2018-09-14 MED ORDER — FENTANYL CITRATE (PF) 100 MCG/2ML IJ SOLN
INTRAMUSCULAR | Status: AC
  Filled 2018-09-14: qty 2

## 2018-09-14 MED ORDER — SODIUM CHLORIDE 0.9 % IV SOLN
INTRAVENOUS | Status: DC
  Administered 2018-09-14: 11:00:00 via INTRAVENOUS

## 2018-09-14 MED ORDER — ARMC OPHTHALMIC DILATING DROPS
OPHTHALMIC | Status: AC
  Filled 2018-09-14: qty 0.5

## 2018-09-14 MED ORDER — CARBACHOL 0.01 % IO SOLN
INTRAOCULAR | Status: DC | PRN
  Administered 2018-09-14: .5 mL via INTRAOCULAR

## 2018-09-14 MED ORDER — NA CHONDROIT SULF-NA HYALURON 40-17 MG/ML IO SOLN
INTRAOCULAR | Status: AC
  Filled 2018-09-14: qty 1

## 2018-09-14 MED ORDER — POVIDONE-IODINE 5 % OP SOLN
OPHTHALMIC | Status: AC
  Filled 2018-09-14: qty 30

## 2018-09-14 MED ORDER — LIDOCAINE HCL (PF) 4 % IJ SOLN
INTRAOCULAR | Status: DC | PRN
  Administered 2018-09-14: 2 mL via OPHTHALMIC

## 2018-09-14 MED ORDER — MIDAZOLAM HCL 2 MG/2ML IJ SOLN
INTRAMUSCULAR | Status: DC | PRN
  Administered 2018-09-14 (×2): 1 mg via INTRAVENOUS

## 2018-09-14 MED ORDER — MOXIFLOXACIN HCL 0.5 % OP SOLN: 1.0000 [drp] | OPHTHALMIC | Status: DC | PRN

## 2018-09-14 SURGICAL SUPPLY — 16 items
GLOVE BIO SURGEON STRL SZ8 (GLOVE) ×3 IMPLANT
GLOVE BIOGEL M 6.5 STRL (GLOVE) ×3 IMPLANT
GLOVE SURG LX 8.0 MICRO (GLOVE) ×2
GLOVE SURG LX STRL 8.0 MICRO (GLOVE) ×1 IMPLANT
GOWN STRL REUS W/ TWL LRG LVL3 (GOWN DISPOSABLE) ×2 IMPLANT
GOWN STRL REUS W/TWL LRG LVL3 (GOWN DISPOSABLE) ×6
LABEL CATARACT MEDS ST (LABEL) ×3 IMPLANT
LENS IOL TECNIS ITEC 20.5 (Intraocular Lens) ×2 IMPLANT
PACK CATARACT (MISCELLANEOUS) ×3 IMPLANT
PACK CATARACT BRASINGTON LX (MISCELLANEOUS) ×3 IMPLANT
PACK EYE AFTER SURG (MISCELLANEOUS) ×3 IMPLANT
SOL BSS BAG (MISCELLANEOUS) ×3
SOLUTION BSS BAG (MISCELLANEOUS) ×1 IMPLANT
SYR 5ML LL (SYRINGE) ×3 IMPLANT
WATER STERILE IRR 250ML POUR (IV SOLUTION) ×3 IMPLANT
WIPE NON LINTING 3.25X3.25 (MISCELLANEOUS) ×3 IMPLANT

## 2018-09-14 NOTE — Transfer of Care (Signed)
Immediate Anesthesia Transfer of Care Note  Patient: Angela Barrett  Procedure(s) Performed: CATARACT EXTRACTION PHACO AND INTRAOCULAR LENS PLACEMENT (IOC) RIGHT, DIABETIC (Right Eye)  Patient Location: PACU, Short Stay and Endoscopy Unit  Anesthesia Type:MAC  Level of Consciousness: awake, alert  and oriented  Airway & Oxygen Therapy: Patient Spontanous Breathing  Post-op Assessment: Report given to RN and Post -op Vital signs reviewed and stable  Post vital signs: Reviewed and stable  Last Vitals:  Vitals Value Taken Time  BP    Temp    Pulse    Resp    SpO2      Last Pain:  Vitals:   09/14/18 0943  TempSrc: Temporal         Complications: No apparent anesthesia complications

## 2018-09-14 NOTE — Anesthesia Preprocedure Evaluation (Signed)
Anesthesia Evaluation  Patient identified by MRN, date of birth, ID band Patient awake    Reviewed: Allergy & Precautions, NPO status , Patient's Chart, lab work & pertinent test results  History of Anesthesia Complications (+) PONV and history of anesthetic complications  Airway Mallampati: III  TM Distance: >3 FB Neck ROM: Full    Dental  (+) Upper Dentures   Pulmonary neg sleep apnea, neg COPD,    breath sounds clear to auscultation- rhonchi (-) wheezing      Cardiovascular hypertension, Pt. on medications (-) angina+ CAD, + Past MI, + CABG (2014) and + Peripheral Vascular Disease   Rhythm:Regular Rate:Normal - Systolic murmurs and - Diastolic murmurs    Neuro/Psych  Headaches, neg Seizures PSYCHIATRIC DISORDERS Anxiety Depression    GI/Hepatic Neg liver ROS, hiatal hernia, GERD  ,  Endo/Other  diabetes, Insulin DependentHypothyroidism   Renal/GU negative Renal ROS     Musculoskeletal  (+) Arthritis ,   Abdominal (+) - obese,   Peds  Hematology  (+) anemia ,   Anesthesia Other Findings Past Medical History: No date: Anemia     Comment:  "after OHS in 01/2013" (05/25/2013) No date: Anginal pain ([place]) No date: Anxiety No date: Arthritis     Comment:  "joints" (05/25/2013) No date: At risk for falls No date: Coronary artery disease     Comment:  a. nl myoview ~ 2006;  b. 01/2013 s/p MI and CABG x 4               (VG->D2, VG->RPDA->OM3, LIMA->LAD;  c. 05/2013 Cath/PCI:               LM nl, LAD 60-70p/m, D1 70p, LCX 163m OM2 60p, RCA               50-60p/824m3.0x33 Xience DES), VG->D2 60, VG->RPDA/OM3               100, LIMA->LAD nl w 70 dLAD, EF 50%. No date: Depression No date: Diabetes mellitus, type 2 ([facility]    Comment:  "dx'd 1992" (05/25/2013) No date: Dyspnea     Comment:  DOE No date: Gait disturbance     Comment:  uses walker some No date: GERD (gastroesophageal reflux disease) No date:  Glaucoma     Comment:  bilateral No date: Headache(784.0)     Comment:  "monthly" (05/25/2013) No date: History of hiatal hernia 1985: History of pneumonia No date: Hyperlipidemia No date: Hypertension No date: Hypothyroidism No date: IBS (irritable bowel syndrome) 03/24/2012: Lymphocytosis [facility]    Comment:  2014 No date: OA (osteoarthritis) No date: PONV (postoperative nausea and vomiting) No date: Vertigo   Reproductive/Obstetrics                             Anesthesia Physical Anesthesia Plan  ASA: III  Anesthesia Plan: MAC   Post-op Pain Management:    Induction: Intravenous  PONV Risk Score and Plan: 3 and Midazolam  Airway Management Planned: Natural Airway  Additional Equipment:   Intra-op Plan:   Post-operative Plan:   Informed Consent: I have reviewed the patients History and Physical, chart, labs and discussed the procedure including the risks, benefits and alternatives for the proposed anesthesia with the patient or authorized representative who has indicated his/her understanding and acceptance.       Plan Discussed with: CRNA and Anesthesiologist  Anesthesia Plan Comments:  Anesthesia Quick Evaluation  

## 2018-09-14 NOTE — Anesthesia Post-op Follow-up Note (Signed)
Anesthesia QCDR form completed.        

## 2018-09-14 NOTE — Discharge Instructions (Addendum)
Eye Surgery Discharge Instructions  Expect mild scratchy sensation or mild soreness. DO NOT RUB YOUR EYE!  The day of surgery:  Minimal physical activity, but bed rest is not required  No reading, computer work, or close hand work  No bending, lifting, or straining.  May watch TV  For 24 hours:  No driving, legal decisions, or alcoholic beverages  Safety precautions  Eat anything you prefer: It is better to start with liquids, then soup then solid foods. *     Solar shield eyeglasses should be worn for comfort in the sunlight/patch while sleeping  Resume all regular medications including aspirin or Coumadin if these were discontinued prior to surgery. You may shower, bathe, shave, or wash your hair. Tylenol may be taken for mild discomfort. Follow Dr. Inda Coke eye drop instruction sheet as reviewed.  Call your doctor if you experience significant pain, nausea, or vomiting, fever > 101 or other signs of infection. [phone] or [phone] Specific instructions:  Follow-up Information    Birder Robson, MD Follow up.   Specialty:  Ophthalmology Why:  09/15/18 10:35 am @ 10:35 am  Contact information: [place] [address] [phone]

## 2018-09-14 NOTE — Anesthesia Postprocedure Evaluation (Signed)
Anesthesia Post Note  Patient: CRISTLE JARED  Procedure(s) Performed: CATARACT EXTRACTION PHACO AND INTRAOCULAR LENS PLACEMENT (IOC) RIGHT, DIABETIC (Right Eye)  Patient location during evaluation: PACU Anesthesia Type: MAC Level of consciousness: awake and alert and oriented Pain management: pain level controlled Vital Signs Assessment: post-procedure vital signs reviewed and stable Respiratory status: spontaneous breathing, nonlabored ventilation and respiratory function stable Cardiovascular status: blood pressure returned to baseline and stable Postop Assessment: no signs of nausea or vomiting Anesthetic complications: no     Last Vitals:  Vitals:   09/14/18 0943 09/14/18 1121  BP: 116/73 (!) 129/56  Pulse: (!) 59 (!) 58  Temp: (!) 36.2 C 37 C  SpO2: 100% 100%    Last Pain:  Vitals:   09/14/18 1121  TempSrc: Oral  PainSc: 0-No pain                 Clydell Alberts

## 2018-09-14 NOTE — H&P (Signed)
All labs reviewed. Abnormal studies sent to patients PCP when indicated.  Previous H&P reviewed, patient examined, there are NO CHANGES.  Angela Barrett Porfilio2/11/202010:41 AM

## 2018-09-14 NOTE — Op Note (Signed)
PREOPERATIVE DIAGNOSIS:  Nuclear sclerotic cataract of the right eye.   POSTOPERATIVE DIAGNOSIS:  nuclear sclerotic cataract right eye   OPERATIVE PROCEDURE: Procedure(s): CATARACT EXTRACTION PHACO AND INTRAOCULAR LENS PLACEMENT (IOC) RIGHT, DIABETIC   SURGEON:  Birder Robson, MD.   ANESTHESIA:  Anesthesiologist: Emmie Niemann, MD CRNA: Marsh Dolly, CRNA  1.      Managed anesthesia care. 2.      0.81ml of Shugarcaine was instilled in the eye following the paracentesis.   COMPLICATIONS:  None.   TECHNIQUE:   Stop and chop   DESCRIPTION OF PROCEDURE:  The patient was examined and consented in the preoperative holding area where the aforementioned topical anesthesia was applied to the right eye and then brought back to the Operating Room where the right eye was prepped and draped in the usual sterile ophthalmic fashion and a lid speculum was placed. A paracentesis was created with the side port blade and the anterior chamber was filled with viscoelastic. A near clear corneal incision was performed with the steel keratome. A continuous curvilinear capsulorrhexis was performed with a cystotome followed by the capsulorrhexis forceps. Hydrodissection and hydrodelineation were carried out with BSS on a blunt cannula. The lens was removed in a stop and chop  technique and the remaining cortical material was removed with the irrigation-aspiration handpiece. The capsular bag was inflated with viscoelastic and the Technis ZCB00  lens was placed in the capsular bag without complication. The remaining viscoelastic was removed from the eye with the irrigation-aspiration handpiece. The wounds were hydrated. The anterior chamber was flushed with Miostat and the eye was inflated to physiologic pressure. 0.43ml of Vigamox was placed in the anterior chamber. The wounds were found to be water tight. The eye was dressed with Vigamox. The patient was given protective glasses to wear throughout the day and a shield  with which to sleep tonight. The patient was also given drops with which to begin a drop regimen today and will follow-up with me in one day. Implant Name Type Inv. Item Serial No. Manufacturer Lot No. LRB No. Used  LENS IOL DIOP 20.5 - [id number] Intraocular Lens LENS IOL DIOP 20.5 [id number] AMO  Right 1   Procedure(s) with comments: CATARACT EXTRACTION PHACO AND INTRAOCULAR LENS PLACEMENT (IOC) RIGHT, DIABETIC (Right) - [place]  01:39 CDE 15.79 Fluid pack lot # [id number]  Electronically signed: Birder Robson 09/14/2018 11:19 AM

## 2018-10-13 NOTE — Progress Notes (Signed)
Cardiology Office Note   Date:  10/14/2018   ID:  Meri, Pelot [birth year], MRN [id number]  PCP:  Burnard Bunting, MD  Cardiologist:  Kathlyn Sacramento, MD   Chief Complaint  Patient presents with   Other    12 month follow up. Meds reviewed by the pt. verbally. Pt. c/o pounding in chest, chest pain, dizzy with feeling like could pass out.       History of Present Illness: Angela Barrett is a 78 y.o. female who presents for a followup regarding coronary artery disease status post CABG and PCI.  She has known history of diabetes and hypertension. She had CABG in June, 2014 after NSTEMI.  She had recurrent angina post CABG. Cardiac cath in 05/2013 showed significant 3 vessel CAD with diffuse diabetic vessels. SVG to RPDA/OM3 was found to be occluded. OM3 got collaterals from RCA. EF was low normal. I performed Successful PCI and DES placement to proximal and mid right coronary artery. Left circumflex occlusion was left to be treated medically. Most recent nuclear stress test in January 2016 was normal. Most recent echocardiogram in August 2016 showed normal LV systolic function, mild to moderate mitral regurgitation and mild pulmonary hypertension.   The patient is not doing well today. She is accompanied by her caregiver. She has dizziness everyday which has given her trouble walking. A dizzy episode caused her to fall in which she stayed laid out for 8 hours until she was finally able to call her daughter for help. If she attempts to look up high she feels that she will fall backwards. Her balance is currently not good. Changing positions can also cause the dizziness. She is still compliant with all her lisinopril and carvedilol. She believes the dizziness may be due an eye surgery that has had some complications. Her providers believe it may be an inner ear issue. She did not take any medication prior to visit today. She has been having intermittent, sharp chest pain that only occurs  when at rest, the episodes are very short. She sometimes has shortness of breath associated with exertion and at rest. At night she can feel palpitations while laying on her bed. The palpitations have been occurring almost every night except for the past two nights. The last time she used nitroglycerin was one month ago.    Past Medical History:  Diagnosis Date   Anemia    "after OHS in 01/2013" (05/25/2013)   Anginal pain ([place])    Anxiety    Arthritis    "joints" (05/25/2013)   At risk for falls    Coronary artery disease    a. nl myoview ~ 2006;  b. 01/2013 s/p MI and CABG x 4 (VG->D2, VG->RPDA->OM3, LIMA->LAD;  c. 05/2013 Cath/PCI: LM nl, LAD 60-70p/m, D1 70p, LCX 172m, OM2 60p, RCA 50-60p/31m (3.0x33 Xience DES), VG->D2 60, VG->RPDA/OM3 100, LIMA->LAD nl w 70 dLAD, EF 50%.   Depression    Diabetes mellitus, type 2 ([place])    "dx'd 1992" (05/25/2013)   Dyspnea    DOE   Gait disturbance    uses walker some   GERD (gastroesophageal reflux disease)    Glaucoma    bilateral   Headache(784.0)    "monthly" (05/25/2013)   History of hiatal hernia    History of pneumonia 1985   Hyperlipidemia    Hypertension    Hypothyroidism    IBS (irritable bowel syndrome)    Lymphocytosis 03/24/2012   Myocardial infarction ([place])  2014   OA (osteoarthritis)    PONV (postoperative nausea and vomiting)    Vertigo     Past Surgical History:  Procedure Laterality Date   BLADDER SUSPENSION  2005   CARDIAC CATHETERIZATION  01/2013   CARPAL TUNNEL RELEASE Bilateral 1990-1992   CATARACT EXTRACTION W/PHACO Left 08/17/2018   Procedure: CATARACT EXTRACTION PHACO AND INTRAOCULAR LENS PLACEMENT (IOC) LEFT, DIABETIC;  Surgeon: Birder Robson, MD;  Location: [facility];  Service: Ophthalmology;  Laterality: Left;  [place] 01:01 CDE 8.51 Fluid pack lot # [id number]   CATARACT EXTRACTION W/PHACO Right 09/14/2018   Procedure: CATARACT EXTRACTION PHACO AND INTRAOCULAR LENS PLACEMENT  (IOC) RIGHT, DIABETIC;  Surgeon: Birder Robson, MD;  Location: [facility];  Service: Ophthalmology;  Laterality: Right;  [place]  01:39 CDE 15.79 Fluid pack lot # [id number]   COLONOSCOPY WITH PROPOFOL N/A 11/20/2015   Procedure: COLONOSCOPY WITH PROPOFOL;  Surgeon: Lucilla Lame, MD;  Location: [facility];  Service: Endoscopy;  Laterality: N/A;   CORONARY ANGIOPLASTY WITH STENT PLACEMENT  05/25/2013   "1" (05/25/2013)   CORONARY ARTERY BYPASS GRAFT N/A 01/07/2013   Procedure: CORONARY ARTERY BYPASS GRAFTING (CABG);  Surgeon: Melrose Nakayama, MD;  Location: [place];  Service: Open Heart Surgery;  Laterality: N/A;   ESOPHAGOGASTRODUODENOSCOPY (EGD) WITH PROPOFOL N/A 11/20/2015   Procedure: ESOPHAGOGASTRODUODENOSCOPY (EGD) WITH PROPOFOL;  Surgeon: Lucilla Lame, MD;  Location: [facility];  Service: Endoscopy;  Laterality: N/A;   HAMMER TOE SURGERY Right 2009   KNEE ARTHROSCOPY Bilateral 1993-1996   LEFT HEART CATHETERIZATION WITH CORONARY ANGIOGRAM N/A 01/05/2013   Procedure: LEFT HEART CATHETERIZATION WITH CORONARY ANGIOGRAM;  Surgeon: Wellington Hampshire, MD;  Location: [place] CATH LAB;  Service: Cardiovascular;  Laterality: N/A;   LEFT HEART CATHETERIZATION WITH CORONARY/GRAFT ANGIOGRAM N/A 05/25/2013   Procedure: LEFT HEART CATHETERIZATION WITH Beatrix Fetters;  Surgeon: Wellington Hampshire, MD;  Location: [place] CATH [facility];  Service: Cardiovascular;  Laterality: N/A;   PERCUTANEOUS CORONARY STENT INTERVENTION (PCI-S)  05/25/2013   Procedure: PERCUTANEOUS CORONARY STENT INTERVENTION (PCI-S);  Surgeon: Wellington Hampshire, MD;  Location: [facility];  Service: Cardiovascular;;   THYROIDECTOMY  2010   VAGINAL HYSTERECTOMY  1974     Current Outpatient Medications  Medication Sig Dispense Refill   acetaminophen (TYLENOL ARTHRITIS PAIN) 650 MG CR tablet Take 650 mg by mouth at bedtime.      aspirin 81 MG tablet Take 1 tablet (81 mg total) by mouth daily.     B-D INS SYRINGE 0.5CC/31GX5/16  31G X 5/16" 0.5 ML MISC daily. as directed  5   B-D ULTRAFINE III SHORT PEN 31G X 8 MM MISC daily. as directed  5   Carboxymethylcellulose Sodium (THERATEARS) 0.25 % SOLN Place 1 drop into both eyes 3 (three) times daily as needed (for dry/irritated eyes.).     carvedilol (COREG) 3.125 MG tablet TAKE 1 TABLET BY MOUTH TWICE A DAY (Patient taking differently: Take 3.125 mg by mouth 2 (two) times daily. ) 180 tablet 3   latanoprost (XALATAN) 0.005 % ophthalmic solution Place 1 drop into both eyes at bedtime.      LEVEMIR 100 UNIT/ML injection Inject 20 Units into the skin at bedtime.   3   levothyroxine (SYNTHROID, LEVOTHROID) 88 MCG tablet Take 88 mcg by mouth daily before breakfast.     lisinopril (PRINIVIL,ZESTRIL) 5 MG tablet TAKE 1 TABLET BY MOUTH EVERY DAY (Patient taking differently: Take 5 mg by mouth daily with breakfast. ) 90 tablet 3   LORazepam (ATIVAN) 1 MG  tablet Take 2.5 mg by mouth at bedtime.      meclizine (ANTIVERT) 25 MG tablet Take 25 mg by mouth daily.     metFORMIN (GLUCOPHAGE-XR) 500 MG 24 hr tablet Take 1 tablet (500 mg total) by mouth daily with breakfast. (Patient taking differently: Take 500 mg by mouth 2 (two) times daily. )     nitroGLYCERIN (NITROSTAT) 0.4 MG SL tablet PLACE 1 TABLET (0.4 MG TOTAL) UNDER THE TONGUE EVERY 5 (FIVE) MINUTES AS NEEDED FOR CHEST PAIN. 25 tablet 0   ondansetron (ZOFRAN ODT) 4 MG disintegrating tablet Take 1 tablet (4 mg total) by mouth every 8 (eight) hours as needed for nausea or vomiting. 15 tablet 0   ONE TOUCH ULTRA TEST test strip USE AS DIRECTED TO TEST BLOOD SUGAR 3 TIMES A DAY DX E11.51  3   pantoprazole (PROTONIX) 40 MG tablet Take 40 mg by mouth daily before breakfast.     ranolazine (RANEXA) 1000 MG SR tablet TAKE 1 TABLET BY MOUTH TWICE A DAY (Patient taking differently: Take 1,000 mg by mouth 2 (two) times daily. ) 180 tablet 3   rosuvastatin (CRESTOR) 5 MG tablet TAKE 1 TABLET BY MOUTH DAILY (Patient taking  differently: Take 5 mg by mouth daily with breakfast. ) 90 tablet 3   sertraline (ZOLOFT) 25 MG tablet Take 25 mg by mouth daily.     No current facility-administered medications for this visit.     Allergies:   Amitriptyline; Cortisone; Lipitor [atorvastatin]; Macrodantin [nitrofurantoin]; and Motrin [ibuprofen]    Social History:  The patient  reports that she has never smoked. She has never used smokeless tobacco. She reports that she does not drink alcohol or use drugs.   Family History:  The patient's family history includes Cancer in her mother; Diabetes in her son; Emphysema in her brother and father.    ROS:  Please see the history of present illness.   Otherwise, review of systems are positive for none.   All other systems are reviewed and negative.    PHYSICAL EXAM: VS:  BP (!) 116/52 (BP Location: Left Arm, Patient Position: Sitting, Cuff Size: Normal)    Pulse 60    Ht 5\' 4"  (1.626 m)    Wt 133 lb 4 oz (60.4 kg)    BMI 22.87 kg/m  , BMI Body mass index is 22.87 kg/m. GEN: Well nourished, well developed, in no acute distress  HEENT: normal  Neck: no JVD, carotid bruits, or masses Cardiac: RRR; no murmurs, rubs, or gallops,no edema  Respiratory:  clear to auscultation bilaterally, normal work of breathing GI: soft, nontender, nondistended, + BS MS: no deformity or atrophy  Skin: warm and dry, no rash Neuro:  Strength and sensation are intact Psych: euthymic mood, full affect   EKG:  EKG is ordered today. The ekg ordered today demonstrates normal sinus rhythm with no significant ST or T wave changes.   Recent Labs: 12/29/2017: ALT 20; BUN 28; Creatinine, Ser 1.42; Hemoglobin 10.0; Platelets 263; Potassium 5.0; Sodium 133    Lipid Panel    Component Value Date/Time   CHOL 164 06/24/2013 1031   TRIG 143 06/24/2013 1031   HDL 49 06/24/2013 1031   CHOLHDL 3.3 06/24/2013 1031   CHOLHDL 6.2 01/06/2013 0245   VLDL 28 01/06/2013 0245   LDLCALC 86 06/24/2013 1031       Wt Readings from Last 3 Encounters:  10/14/18 133 lb 4 oz (60.4 kg)  08/31/18 135 lb (61.2 kg)  08/17/18  134 lb 14.7 oz (61.2 kg)        ASSESSMENT AND PLAN:  1.  Coronary artery disease involving bypass graft with stable angina: The patient did have worsening chest pain few months ago but she reports significant improvement over the last month with no recurrent episodes.  Her EKG does not show any ischemic changes.  Thus, I am going to hold off on stress testing and continue medical therapy for now.  2. Essential hypertension: Blood pressure is controlled on current medications.  However, she is having increased orthostatic dizziness and blood pressure did drop today with standing up.  I elected to discontinue carvedilol.  3. Hyperlipidemia with intolerance to statins. She has been able to tolerate small dose rosuvastatin .   4.  Dizziness and reported palpitations: I am going to obtain a 14-day ZIO patch monitor.  I do suspect that her dizziness might be noncardiac related to recent cataract surgery with vision loss in the right eye and also she might have some inner ear problems.  Disposition:   FU with me in 6 months  I, Jesus Reyes am acting as a Education administrator for Kathlyn Sacramento, M.D.  I have reviewed the above documentation for accuracy and completeness, and I agree with the above.   Signed, Kathlyn Sacramento, MD 10/14/18 [place], [place]

## 2018-10-14 ENCOUNTER — Ambulatory Visit (INDEPENDENT_AMBULATORY_CARE_PROVIDER_SITE_OTHER): Payer: Medicare Other

## 2018-10-14 ENCOUNTER — Ambulatory Visit (INDEPENDENT_AMBULATORY_CARE_PROVIDER_SITE_OTHER): Payer: Medicare Other | Admitting: Cardiovascular Disease

## 2018-10-14 ENCOUNTER — Encounter: Payer: Self-pay | Admitting: Cardiovascular Disease

## 2018-10-14 ENCOUNTER — Other Ambulatory Visit: Payer: Self-pay

## 2018-10-14 VITALS — BP 116/52 | HR 60 | Ht 64.0 in | Wt 133.2 lb

## 2018-10-14 DIAGNOSIS — E785 Hyperlipidemia, unspecified: Secondary | ICD-10-CM

## 2018-10-14 DIAGNOSIS — R002 Palpitations: Secondary | ICD-10-CM | POA: Diagnosis not present

## 2018-10-14 DIAGNOSIS — I25118 Atherosclerotic heart disease of native coronary artery with other forms of angina pectoris: Secondary | ICD-10-CM

## 2018-10-14 DIAGNOSIS — I10 Essential (primary) hypertension: Secondary | ICD-10-CM | POA: Diagnosis not present

## 2018-10-14 NOTE — Patient Instructions (Signed)
Medication Instructions:  STOP the Carvedilol  If you need a refill on your cardiac medications before your next appointment, please call your pharmacy.   Lab work: None ordered  Testing/Procedures: Your physician has recommended that you wear a 14 day Zio event monitor. Event monitors are medical devices that record the heart's electrical activity. Doctors most often [place] these monitors to diagnose arrhythmias. Arrhythmias are problems with the speed or rhythm of the heartbeat. The monitor is a small, portable device. You can wear one while you do your normal daily activities. This is usually used to diagnose what is causing palpitations/syncope (passing out).  Follow-Up: At [facility], you and your health needs are our priority.  As part of our continuing mission to provide you with exceptional heart care, we have created designated Provider Care Teams.  These Care Teams include your primary Cardiologist (physician) and Advanced Practice Providers (APPs -  Physician Assistants and Nurse Practitioners) who all work together to provide you with the care you need, when you need it. You will need a follow up appointment in 6 months.  Please call our office 2 months in advance to schedule this appointment.  You may see Kathlyn Sacramento, MD or one of the following Advanced Practice Providers on your designated Care Team:   Murray Hodgkins, NP Christell Faith, PA-C . Marrianne Mood, PA-C

## 2018-10-15 ENCOUNTER — Telehealth: Payer: Self-pay | Admitting: Cardiovascular Disease

## 2018-10-15 NOTE — Telephone Encounter (Signed)
Pt c/o Syncope: STAT if syncope occurred within 30 minutes and pt complains of lightheadedness High Priority if episode of passing out, completely, today or in last 24 hours   1. Did you pass out today?  yes  2. When is the last time you passed out?  20 minutes ago   3. Has this occurred multiple times? Yes   4. Did you have any symptoms prior to passing out?  No pre syncope sx patient c/o she just doesn't feel right after daughter states she has LOC for a few seconds and doesn't know what happened

## 2018-10-15 NOTE — Telephone Encounter (Signed)
Spoke with the patient's daughter. She stated that the patient just passed out. This is the third time in a month. She stated that the patient does not remember the episodes or having any physical warning before this occurs. Her blood pressure and heart rate immediately after she passed out was 166/79 and 58. She stated that the patient has not been acting right and has no energy.  She has been having more dizziness lately and has been taking meclizine. The daughter stated it has gotten to the point that they do not leave her alone.   Message routed to the provider for recommendations.

## 2018-10-15 NOTE — Telephone Encounter (Signed)
Call returned to the patient's daughter. She has been advised that per Dr. Fletcher Anon, if the patient has another syncopal episode then we will switch out the current event monitor the patient is wearing to a different form (Zio AT).  The daughter has been advised to call her PCP for further advise. Since the patient has not been acting the same, she has also been advised that taking her to an Urgent Care or the ED can be an option as well. The daughter stated that she will call back to keep [place] updated and if there is anything else we can do for her.

## 2018-10-22 ENCOUNTER — Telehealth: Payer: Self-pay | Admitting: Cardiovascular Disease

## 2018-10-22 DIAGNOSIS — N183 Chronic kidney disease, stage 3 (moderate): Secondary | ICD-10-CM | POA: Diagnosis not present

## 2018-10-22 DIAGNOSIS — R55 Syncope and collapse: Secondary | ICD-10-CM | POA: Diagnosis not present

## 2018-10-22 DIAGNOSIS — I129 Hypertensive chronic kidney disease with stage 1 through stage 4 chronic kidney disease, or unspecified chronic kidney disease: Secondary | ICD-10-CM | POA: Diagnosis not present

## 2018-10-22 NOTE — Telephone Encounter (Signed)
Spoke with the daughter. The patient fell again this afternoon. It has been a week since she last fell. The daughter stated that this was a hard fall this time. They called EMS and was advised that since they did not think anything was broken she should avoid the ED at this time. They advised to call PCP and cardiology.   Per the last call, last week, the daughter did call the PCP but has not received a call back since last week. She has been advised to call again today.  The patient is currently on day 8 of her ZIO XT monitor.  The daughter is very overwhelmed at this point. Her father is bedridden and she takes care of her full time but cannot leave her mother unattended due to her falls. She also has a teenaged son.   The daughter would like to know if she needs blood work or what the next step should be.

## 2018-10-22 NOTE — Telephone Encounter (Signed)
The patient's daughter has been made aware to go ahead and mail the monitor back. She will call back if anything further is needed in the meantime.   The patient did have a visit with her PCP today.

## 2018-10-22 NOTE — Telephone Encounter (Signed)
As discussed, ask her to send her Zio monitor back for analysis given that she had 2 syncopal episodes while wearing it.

## 2018-10-22 NOTE — Telephone Encounter (Signed)
Pt c/o Syncope: STAT if syncope occurred within 30 minutes and pt complains of lightheadedness High Priority if episode of passing out, completely, today or in last 24 hours   1. Did you pass out today? Yes   2. When is the last time you passed out? Today 1230   3. Has this occurred multiple times? Yes see previous phone notes   4. Did you have any symptoms prior to passing out?  No    Per daughter patient fell pretty hard this time .  C/o pain now in legs.  Patient wearing zio AT

## 2018-10-23 DIAGNOSIS — R002 Palpitations: Secondary | ICD-10-CM

## 2018-10-27 DIAGNOSIS — Z794 Long term (current) use of insulin: Secondary | ICD-10-CM | POA: Diagnosis not present

## 2018-10-27 DIAGNOSIS — R55 Syncope and collapse: Secondary | ICD-10-CM | POA: Diagnosis not present

## 2018-10-27 DIAGNOSIS — Z9181 History of falling: Secondary | ICD-10-CM | POA: Diagnosis not present

## 2018-10-27 DIAGNOSIS — N183 Chronic kidney disease, stage 3 (moderate): Secondary | ICD-10-CM | POA: Diagnosis not present

## 2018-10-27 DIAGNOSIS — I129 Hypertensive chronic kidney disease with stage 1 through stage 4 chronic kidney disease, or unspecified chronic kidney disease: Secondary | ICD-10-CM | POA: Diagnosis not present

## 2018-10-27 DIAGNOSIS — E1122 Type 2 diabetes mellitus with diabetic chronic kidney disease: Secondary | ICD-10-CM | POA: Diagnosis not present

## 2018-10-27 DIAGNOSIS — Z7982 Long term (current) use of aspirin: Secondary | ICD-10-CM | POA: Diagnosis not present

## 2018-10-28 DIAGNOSIS — R002 Palpitations: Secondary | ICD-10-CM | POA: Diagnosis not present

## 2018-10-29 DIAGNOSIS — I129 Hypertensive chronic kidney disease with stage 1 through stage 4 chronic kidney disease, or unspecified chronic kidney disease: Secondary | ICD-10-CM | POA: Diagnosis not present

## 2018-10-29 DIAGNOSIS — N183 Chronic kidney disease, stage 3 (moderate): Secondary | ICD-10-CM | POA: Diagnosis not present

## 2018-10-29 DIAGNOSIS — Z9181 History of falling: Secondary | ICD-10-CM | POA: Diagnosis not present

## 2018-10-29 DIAGNOSIS — E1122 Type 2 diabetes mellitus with diabetic chronic kidney disease: Secondary | ICD-10-CM | POA: Diagnosis not present

## 2018-10-29 DIAGNOSIS — R55 Syncope and collapse: Secondary | ICD-10-CM | POA: Diagnosis not present

## 2018-10-29 DIAGNOSIS — Z7982 Long term (current) use of aspirin: Secondary | ICD-10-CM | POA: Diagnosis not present

## 2018-11-01 DIAGNOSIS — R55 Syncope and collapse: Secondary | ICD-10-CM | POA: Diagnosis not present

## 2018-11-01 DIAGNOSIS — E1122 Type 2 diabetes mellitus with diabetic chronic kidney disease: Secondary | ICD-10-CM | POA: Diagnosis not present

## 2018-11-01 DIAGNOSIS — N183 Chronic kidney disease, stage 3 (moderate): Secondary | ICD-10-CM | POA: Diagnosis not present

## 2018-11-01 DIAGNOSIS — Z9181 History of falling: Secondary | ICD-10-CM | POA: Diagnosis not present

## 2018-11-01 DIAGNOSIS — I129 Hypertensive chronic kidney disease with stage 1 through stage 4 chronic kidney disease, or unspecified chronic kidney disease: Secondary | ICD-10-CM | POA: Diagnosis not present

## 2018-11-01 DIAGNOSIS — Z7982 Long term (current) use of aspirin: Secondary | ICD-10-CM | POA: Diagnosis not present

## 2018-11-02 ENCOUNTER — Telehealth: Payer: Self-pay | Admitting: *Deleted

## 2018-11-02 NOTE — Telephone Encounter (Signed)
-----   Message from Wellington Hampshire, MD sent at 11/01/2018  4:56 PM EDT ----- Inform patient that monitor showed only minor abnormalities that do not explain her syncopal episodes.

## 2018-11-02 NOTE — Telephone Encounter (Signed)
Patient's daughter made aware of results and verbalized her understanding.

## 2018-11-02 NOTE — Telephone Encounter (Signed)
Patient made aware of results and verbalized understanding but the line was disconnected. Left a message for her to call back if she had any questions.

## 2018-11-03 DIAGNOSIS — Z7982 Long term (current) use of aspirin: Secondary | ICD-10-CM | POA: Diagnosis not present

## 2018-11-03 DIAGNOSIS — N183 Chronic kidney disease, stage 3 (moderate): Secondary | ICD-10-CM | POA: Diagnosis not present

## 2018-11-03 DIAGNOSIS — R55 Syncope and collapse: Secondary | ICD-10-CM | POA: Diagnosis not present

## 2018-11-03 DIAGNOSIS — Z9181 History of falling: Secondary | ICD-10-CM | POA: Diagnosis not present

## 2018-11-03 DIAGNOSIS — I129 Hypertensive chronic kidney disease with stage 1 through stage 4 chronic kidney disease, or unspecified chronic kidney disease: Secondary | ICD-10-CM | POA: Diagnosis not present

## 2018-11-03 DIAGNOSIS — E1122 Type 2 diabetes mellitus with diabetic chronic kidney disease: Secondary | ICD-10-CM | POA: Diagnosis not present

## 2018-11-04 DIAGNOSIS — E1122 Type 2 diabetes mellitus with diabetic chronic kidney disease: Secondary | ICD-10-CM | POA: Diagnosis not present

## 2018-11-04 DIAGNOSIS — I129 Hypertensive chronic kidney disease with stage 1 through stage 4 chronic kidney disease, or unspecified chronic kidney disease: Secondary | ICD-10-CM | POA: Diagnosis not present

## 2018-11-04 DIAGNOSIS — Z9181 History of falling: Secondary | ICD-10-CM | POA: Diagnosis not present

## 2018-11-04 DIAGNOSIS — Z7982 Long term (current) use of aspirin: Secondary | ICD-10-CM | POA: Diagnosis not present

## 2018-11-04 DIAGNOSIS — N183 Chronic kidney disease, stage 3 (moderate): Secondary | ICD-10-CM | POA: Diagnosis not present

## 2018-11-04 DIAGNOSIS — R55 Syncope and collapse: Secondary | ICD-10-CM | POA: Diagnosis not present

## 2018-11-05 DIAGNOSIS — I129 Hypertensive chronic kidney disease with stage 1 through stage 4 chronic kidney disease, or unspecified chronic kidney disease: Secondary | ICD-10-CM | POA: Diagnosis not present

## 2018-11-05 DIAGNOSIS — Z7982 Long term (current) use of aspirin: Secondary | ICD-10-CM | POA: Diagnosis not present

## 2018-11-05 DIAGNOSIS — R55 Syncope and collapse: Secondary | ICD-10-CM | POA: Diagnosis not present

## 2018-11-05 DIAGNOSIS — Z9181 History of falling: Secondary | ICD-10-CM | POA: Diagnosis not present

## 2018-11-05 DIAGNOSIS — N183 Chronic kidney disease, stage 3 (moderate): Secondary | ICD-10-CM | POA: Diagnosis not present

## 2018-11-05 DIAGNOSIS — E1122 Type 2 diabetes mellitus with diabetic chronic kidney disease: Secondary | ICD-10-CM | POA: Diagnosis not present

## 2018-11-08 DIAGNOSIS — Z7982 Long term (current) use of aspirin: Secondary | ICD-10-CM | POA: Diagnosis not present

## 2018-11-08 DIAGNOSIS — I129 Hypertensive chronic kidney disease with stage 1 through stage 4 chronic kidney disease, or unspecified chronic kidney disease: Secondary | ICD-10-CM | POA: Diagnosis not present

## 2018-11-08 DIAGNOSIS — R55 Syncope and collapse: Secondary | ICD-10-CM | POA: Diagnosis not present

## 2018-11-08 DIAGNOSIS — Z9181 History of falling: Secondary | ICD-10-CM | POA: Diagnosis not present

## 2018-11-08 DIAGNOSIS — E1122 Type 2 diabetes mellitus with diabetic chronic kidney disease: Secondary | ICD-10-CM | POA: Diagnosis not present

## 2018-11-08 DIAGNOSIS — N183 Chronic kidney disease, stage 3 (moderate): Secondary | ICD-10-CM | POA: Diagnosis not present

## 2018-11-09 DIAGNOSIS — E1122 Type 2 diabetes mellitus with diabetic chronic kidney disease: Secondary | ICD-10-CM | POA: Diagnosis not present

## 2018-11-09 DIAGNOSIS — Z9181 History of falling: Secondary | ICD-10-CM | POA: Diagnosis not present

## 2018-11-09 DIAGNOSIS — Z7982 Long term (current) use of aspirin: Secondary | ICD-10-CM | POA: Diagnosis not present

## 2018-11-09 DIAGNOSIS — R55 Syncope and collapse: Secondary | ICD-10-CM | POA: Diagnosis not present

## 2018-11-09 DIAGNOSIS — I129 Hypertensive chronic kidney disease with stage 1 through stage 4 chronic kidney disease, or unspecified chronic kidney disease: Secondary | ICD-10-CM | POA: Diagnosis not present

## 2018-11-09 DIAGNOSIS — N183 Chronic kidney disease, stage 3 (moderate): Secondary | ICD-10-CM | POA: Diagnosis not present

## 2018-11-10 DIAGNOSIS — E1122 Type 2 diabetes mellitus with diabetic chronic kidney disease: Secondary | ICD-10-CM | POA: Diagnosis not present

## 2018-11-10 DIAGNOSIS — Z9181 History of falling: Secondary | ICD-10-CM | POA: Diagnosis not present

## 2018-11-10 DIAGNOSIS — I129 Hypertensive chronic kidney disease with stage 1 through stage 4 chronic kidney disease, or unspecified chronic kidney disease: Secondary | ICD-10-CM | POA: Diagnosis not present

## 2018-11-10 DIAGNOSIS — R55 Syncope and collapse: Secondary | ICD-10-CM | POA: Diagnosis not present

## 2018-11-10 DIAGNOSIS — Z7982 Long term (current) use of aspirin: Secondary | ICD-10-CM | POA: Diagnosis not present

## 2018-11-10 DIAGNOSIS — N183 Chronic kidney disease, stage 3 (moderate): Secondary | ICD-10-CM | POA: Diagnosis not present

## 2018-11-11 ENCOUNTER — Ambulatory Visit: Payer: Medicare Other | Admitting: Cardiovascular Disease

## 2018-11-11 DIAGNOSIS — E1151 Type 2 diabetes mellitus with diabetic peripheral angiopathy without gangrene: Secondary | ICD-10-CM | POA: Diagnosis not present

## 2018-11-11 DIAGNOSIS — I951 Orthostatic hypotension: Secondary | ICD-10-CM | POA: Diagnosis not present

## 2018-11-11 DIAGNOSIS — N183 Chronic kidney disease, stage 3 (moderate): Secondary | ICD-10-CM | POA: Diagnosis not present

## 2018-11-11 DIAGNOSIS — Z7982 Long term (current) use of aspirin: Secondary | ICD-10-CM | POA: Diagnosis not present

## 2018-11-11 DIAGNOSIS — R42 Dizziness and giddiness: Secondary | ICD-10-CM | POA: Diagnosis not present

## 2018-11-11 DIAGNOSIS — R55 Syncope and collapse: Secondary | ICD-10-CM | POA: Diagnosis not present

## 2018-11-11 DIAGNOSIS — E1122 Type 2 diabetes mellitus with diabetic chronic kidney disease: Secondary | ICD-10-CM | POA: Diagnosis not present

## 2018-11-11 DIAGNOSIS — Z9181 History of falling: Secondary | ICD-10-CM | POA: Diagnosis not present

## 2018-11-11 DIAGNOSIS — I129 Hypertensive chronic kidney disease with stage 1 through stage 4 chronic kidney disease, or unspecified chronic kidney disease: Secondary | ICD-10-CM | POA: Diagnosis not present

## 2018-11-12 DIAGNOSIS — E1122 Type 2 diabetes mellitus with diabetic chronic kidney disease: Secondary | ICD-10-CM | POA: Diagnosis not present

## 2018-11-12 DIAGNOSIS — R55 Syncope and collapse: Secondary | ICD-10-CM | POA: Diagnosis not present

## 2018-11-12 DIAGNOSIS — N183 Chronic kidney disease, stage 3 (moderate): Secondary | ICD-10-CM | POA: Diagnosis not present

## 2018-11-12 DIAGNOSIS — Z7982 Long term (current) use of aspirin: Secondary | ICD-10-CM | POA: Diagnosis not present

## 2018-11-12 DIAGNOSIS — Z9181 History of falling: Secondary | ICD-10-CM | POA: Diagnosis not present

## 2018-11-12 DIAGNOSIS — I129 Hypertensive chronic kidney disease with stage 1 through stage 4 chronic kidney disease, or unspecified chronic kidney disease: Secondary | ICD-10-CM | POA: Diagnosis not present

## 2018-11-15 ENCOUNTER — Other Ambulatory Visit: Payer: Self-pay | Admitting: Internal Medicine

## 2018-11-15 DIAGNOSIS — R55 Syncope and collapse: Secondary | ICD-10-CM | POA: Diagnosis not present

## 2018-11-15 DIAGNOSIS — Z7982 Long term (current) use of aspirin: Secondary | ICD-10-CM | POA: Diagnosis not present

## 2018-11-15 DIAGNOSIS — Z9181 History of falling: Secondary | ICD-10-CM | POA: Diagnosis not present

## 2018-11-15 DIAGNOSIS — N183 Chronic kidney disease, stage 3 (moderate): Secondary | ICD-10-CM | POA: Diagnosis not present

## 2018-11-15 DIAGNOSIS — I129 Hypertensive chronic kidney disease with stage 1 through stage 4 chronic kidney disease, or unspecified chronic kidney disease: Secondary | ICD-10-CM | POA: Diagnosis not present

## 2018-11-15 DIAGNOSIS — E1122 Type 2 diabetes mellitus with diabetic chronic kidney disease: Secondary | ICD-10-CM | POA: Diagnosis not present

## 2018-11-17 DIAGNOSIS — N183 Chronic kidney disease, stage 3 (moderate): Secondary | ICD-10-CM | POA: Diagnosis not present

## 2018-11-17 DIAGNOSIS — Z9181 History of falling: Secondary | ICD-10-CM | POA: Diagnosis not present

## 2018-11-17 DIAGNOSIS — E1122 Type 2 diabetes mellitus with diabetic chronic kidney disease: Secondary | ICD-10-CM | POA: Diagnosis not present

## 2018-11-17 DIAGNOSIS — I129 Hypertensive chronic kidney disease with stage 1 through stage 4 chronic kidney disease, or unspecified chronic kidney disease: Secondary | ICD-10-CM | POA: Diagnosis not present

## 2018-11-17 DIAGNOSIS — Z7982 Long term (current) use of aspirin: Secondary | ICD-10-CM | POA: Diagnosis not present

## 2018-11-17 DIAGNOSIS — R55 Syncope and collapse: Secondary | ICD-10-CM | POA: Diagnosis not present

## 2018-11-19 DIAGNOSIS — N183 Chronic kidney disease, stage 3 (moderate): Secondary | ICD-10-CM | POA: Diagnosis not present

## 2018-11-19 DIAGNOSIS — R55 Syncope and collapse: Secondary | ICD-10-CM | POA: Diagnosis not present

## 2018-11-19 DIAGNOSIS — Z7982 Long term (current) use of aspirin: Secondary | ICD-10-CM | POA: Diagnosis not present

## 2018-11-19 DIAGNOSIS — Z9181 History of falling: Secondary | ICD-10-CM | POA: Diagnosis not present

## 2018-11-19 DIAGNOSIS — I129 Hypertensive chronic kidney disease with stage 1 through stage 4 chronic kidney disease, or unspecified chronic kidney disease: Secondary | ICD-10-CM | POA: Diagnosis not present

## 2018-11-19 DIAGNOSIS — E1122 Type 2 diabetes mellitus with diabetic chronic kidney disease: Secondary | ICD-10-CM | POA: Diagnosis not present

## 2018-11-23 ENCOUNTER — Ambulatory Visit
Admission: RE | Admit: 2018-11-23 | Discharge: 2018-11-23 | Disposition: A | Discharge status: Home / Self Care | Payer: Medicare Other | Source: Ambulatory Visit | Attending: Internal Medicine | Admitting: Internal Medicine

## 2018-11-23 ENCOUNTER — Other Ambulatory Visit: Payer: Self-pay

## 2018-11-23 DIAGNOSIS — R55 Syncope and collapse: Secondary | ICD-10-CM

## 2018-11-23 MED ORDER — GADOBENATE DIMEGLUMINE 529 MG/ML IV SOLN
11.0000 mL | Freq: Once | INTRAVENOUS | Status: AC | PRN
  Administered 2018-11-23: 11 mL via INTRAVENOUS

## 2018-11-24 DIAGNOSIS — Z7982 Long term (current) use of aspirin: Secondary | ICD-10-CM | POA: Diagnosis not present

## 2018-11-24 DIAGNOSIS — N183 Chronic kidney disease, stage 3 (moderate): Secondary | ICD-10-CM | POA: Diagnosis not present

## 2018-11-24 DIAGNOSIS — R55 Syncope and collapse: Secondary | ICD-10-CM | POA: Diagnosis not present

## 2018-11-24 DIAGNOSIS — E1122 Type 2 diabetes mellitus with diabetic chronic kidney disease: Secondary | ICD-10-CM | POA: Diagnosis not present

## 2018-11-24 DIAGNOSIS — Z9181 History of falling: Secondary | ICD-10-CM | POA: Diagnosis not present

## 2018-11-24 DIAGNOSIS — I129 Hypertensive chronic kidney disease with stage 1 through stage 4 chronic kidney disease, or unspecified chronic kidney disease: Secondary | ICD-10-CM | POA: Diagnosis not present

## 2018-11-25 DIAGNOSIS — N183 Chronic kidney disease, stage 3 (moderate): Secondary | ICD-10-CM | POA: Diagnosis not present

## 2018-11-25 DIAGNOSIS — Z7982 Long term (current) use of aspirin: Secondary | ICD-10-CM | POA: Diagnosis not present

## 2018-11-25 DIAGNOSIS — I129 Hypertensive chronic kidney disease with stage 1 through stage 4 chronic kidney disease, or unspecified chronic kidney disease: Secondary | ICD-10-CM | POA: Diagnosis not present

## 2018-11-25 DIAGNOSIS — Z9181 History of falling: Secondary | ICD-10-CM | POA: Diagnosis not present

## 2018-11-25 DIAGNOSIS — E1122 Type 2 diabetes mellitus with diabetic chronic kidney disease: Secondary | ICD-10-CM | POA: Diagnosis not present

## 2018-11-25 DIAGNOSIS — R55 Syncope and collapse: Secondary | ICD-10-CM | POA: Diagnosis not present

## 2018-11-26 ENCOUNTER — Other Ambulatory Visit: Payer: Self-pay | Admitting: Cardiovascular Disease

## 2018-11-26 DIAGNOSIS — E1122 Type 2 diabetes mellitus with diabetic chronic kidney disease: Secondary | ICD-10-CM | POA: Diagnosis not present

## 2018-11-26 DIAGNOSIS — N183 Chronic kidney disease, stage 3 (moderate): Secondary | ICD-10-CM | POA: Diagnosis not present

## 2018-11-26 DIAGNOSIS — Z7982 Long term (current) use of aspirin: Secondary | ICD-10-CM | POA: Diagnosis not present

## 2018-11-26 DIAGNOSIS — R55 Syncope and collapse: Secondary | ICD-10-CM | POA: Diagnosis not present

## 2018-11-26 DIAGNOSIS — Z9181 History of falling: Secondary | ICD-10-CM | POA: Diagnosis not present

## 2018-11-26 DIAGNOSIS — Z794 Long term (current) use of insulin: Secondary | ICD-10-CM | POA: Diagnosis not present

## 2018-11-26 DIAGNOSIS — I129 Hypertensive chronic kidney disease with stage 1 through stage 4 chronic kidney disease, or unspecified chronic kidney disease: Secondary | ICD-10-CM | POA: Diagnosis not present

## 2018-11-30 DIAGNOSIS — Z9181 History of falling: Secondary | ICD-10-CM | POA: Diagnosis not present

## 2018-11-30 DIAGNOSIS — Z7982 Long term (current) use of aspirin: Secondary | ICD-10-CM | POA: Diagnosis not present

## 2018-11-30 DIAGNOSIS — N183 Chronic kidney disease, stage 3 (moderate): Secondary | ICD-10-CM | POA: Diagnosis not present

## 2018-11-30 DIAGNOSIS — R55 Syncope and collapse: Secondary | ICD-10-CM | POA: Diagnosis not present

## 2018-11-30 DIAGNOSIS — E1122 Type 2 diabetes mellitus with diabetic chronic kidney disease: Secondary | ICD-10-CM | POA: Diagnosis not present

## 2018-11-30 DIAGNOSIS — I129 Hypertensive chronic kidney disease with stage 1 through stage 4 chronic kidney disease, or unspecified chronic kidney disease: Secondary | ICD-10-CM | POA: Diagnosis not present

## 2018-12-07 DIAGNOSIS — I129 Hypertensive chronic kidney disease with stage 1 through stage 4 chronic kidney disease, or unspecified chronic kidney disease: Secondary | ICD-10-CM | POA: Diagnosis not present

## 2018-12-07 DIAGNOSIS — Z9181 History of falling: Secondary | ICD-10-CM | POA: Diagnosis not present

## 2018-12-07 DIAGNOSIS — Z7982 Long term (current) use of aspirin: Secondary | ICD-10-CM | POA: Diagnosis not present

## 2018-12-07 DIAGNOSIS — N183 Chronic kidney disease, stage 3 (moderate): Secondary | ICD-10-CM | POA: Diagnosis not present

## 2018-12-07 DIAGNOSIS — R55 Syncope and collapse: Secondary | ICD-10-CM | POA: Diagnosis not present

## 2018-12-07 DIAGNOSIS — E1122 Type 2 diabetes mellitus with diabetic chronic kidney disease: Secondary | ICD-10-CM | POA: Diagnosis not present

## 2018-12-08 DIAGNOSIS — Z7982 Long term (current) use of aspirin: Secondary | ICD-10-CM | POA: Diagnosis not present

## 2018-12-08 DIAGNOSIS — I129 Hypertensive chronic kidney disease with stage 1 through stage 4 chronic kidney disease, or unspecified chronic kidney disease: Secondary | ICD-10-CM | POA: Diagnosis not present

## 2018-12-08 DIAGNOSIS — E1122 Type 2 diabetes mellitus with diabetic chronic kidney disease: Secondary | ICD-10-CM | POA: Diagnosis not present

## 2018-12-08 DIAGNOSIS — R55 Syncope and collapse: Secondary | ICD-10-CM | POA: Diagnosis not present

## 2018-12-08 DIAGNOSIS — Z9181 History of falling: Secondary | ICD-10-CM | POA: Diagnosis not present

## 2018-12-08 DIAGNOSIS — N183 Chronic kidney disease, stage 3 (moderate): Secondary | ICD-10-CM | POA: Diagnosis not present

## 2018-12-22 DIAGNOSIS — I129 Hypertensive chronic kidney disease with stage 1 through stage 4 chronic kidney disease, or unspecified chronic kidney disease: Secondary | ICD-10-CM | POA: Diagnosis not present

## 2018-12-22 DIAGNOSIS — R55 Syncope and collapse: Secondary | ICD-10-CM | POA: Diagnosis not present

## 2018-12-22 DIAGNOSIS — Z9181 History of falling: Secondary | ICD-10-CM | POA: Diagnosis not present

## 2018-12-22 DIAGNOSIS — E1122 Type 2 diabetes mellitus with diabetic chronic kidney disease: Secondary | ICD-10-CM | POA: Diagnosis not present

## 2018-12-22 DIAGNOSIS — N183 Chronic kidney disease, stage 3 (moderate): Secondary | ICD-10-CM | POA: Diagnosis not present

## 2018-12-22 DIAGNOSIS — Z7982 Long term (current) use of aspirin: Secondary | ICD-10-CM | POA: Diagnosis not present

## 2018-12-23 ENCOUNTER — Encounter (HOSPITAL_COMMUNITY): Payer: Medicare Other

## 2019-01-03 ENCOUNTER — Telehealth: Payer: Self-pay | Admitting: Cardiovascular Disease

## 2019-01-03 NOTE — Telephone Encounter (Signed)
Patient calling, states she is returning our call Patient has recently fallen about 8 times - close to passing out States when she falls she has no strength to get back up Has had some tests done and was wondering if it could be about that Please call to discuss

## 2019-01-03 NOTE — Telephone Encounter (Signed)
Spoke with the pt. Pt sts that she contacted our office in error. She was just able to access some of her old phone messages from March/April. Pt sts that she is under the care of her pcp, and she is receiving therapy for her frequent falls.  She plans to f/u with Dr.Arida in Aug 2020, she will call back to schedule. No further action needed.

## 2019-01-26 ENCOUNTER — Inpatient Hospital Stay
Admission: EM | Admit: 2019-01-26 | Discharge: 2019-01-29 | DRG: 281 | Disposition: A | Discharge status: Home Health | Payer: Medicare Other | Attending: Internal Medicine | Admitting: Internal Medicine

## 2019-01-26 ENCOUNTER — Other Ambulatory Visit: Payer: Self-pay

## 2019-01-26 ENCOUNTER — Emergency Department: Payer: Medicare Other

## 2019-01-26 DIAGNOSIS — R778 Other specified abnormalities of plasma proteins: Secondary | ICD-10-CM

## 2019-01-26 DIAGNOSIS — Z955 Presence of coronary angioplasty implant and graft: Secondary | ICD-10-CM

## 2019-01-26 DIAGNOSIS — Z79890 Hormone replacement therapy (postmenopausal): Secondary | ICD-10-CM

## 2019-01-26 DIAGNOSIS — F411 Generalized anxiety disorder: Secondary | ICD-10-CM | POA: Diagnosis present

## 2019-01-26 DIAGNOSIS — R197 Diarrhea, unspecified: Secondary | ICD-10-CM | POA: Diagnosis present

## 2019-01-26 DIAGNOSIS — Y712 Prosthetic and other implants, materials and accessory cardiovascular devices associated with adverse incidents: Secondary | ICD-10-CM | POA: Diagnosis present

## 2019-01-26 DIAGNOSIS — R5381 Other malaise: Secondary | ICD-10-CM | POA: Diagnosis not present

## 2019-01-26 DIAGNOSIS — T82855A Stenosis of coronary artery stent, initial encounter: Secondary | ICD-10-CM | POA: Diagnosis present

## 2019-01-26 DIAGNOSIS — Z951 Presence of aortocoronary bypass graft: Secondary | ICD-10-CM | POA: Diagnosis not present

## 2019-01-26 DIAGNOSIS — R42 Dizziness and giddiness: Secondary | ICD-10-CM | POA: Diagnosis present

## 2019-01-26 DIAGNOSIS — I471 Supraventricular tachycardia: Secondary | ICD-10-CM | POA: Diagnosis present

## 2019-01-26 DIAGNOSIS — R112 Nausea with vomiting, unspecified: Secondary | ICD-10-CM | POA: Diagnosis present

## 2019-01-26 DIAGNOSIS — E785 Hyperlipidemia, unspecified: Secondary | ICD-10-CM | POA: Diagnosis present

## 2019-01-26 DIAGNOSIS — I25708 Atherosclerosis of coronary artery bypass graft(s), unspecified, with other forms of angina pectoris: Secondary | ICD-10-CM | POA: Diagnosis present

## 2019-01-26 DIAGNOSIS — Z7982 Long term (current) use of aspirin: Secondary | ICD-10-CM

## 2019-01-26 DIAGNOSIS — I252 Old myocardial infarction: Secondary | ICD-10-CM | POA: Diagnosis not present

## 2019-01-26 DIAGNOSIS — Z881 Allergy status to other antibiotic agents status: Secondary | ICD-10-CM

## 2019-01-26 DIAGNOSIS — I25118 Atherosclerotic heart disease of native coronary artery with other forms of angina pectoris: Secondary | ICD-10-CM | POA: Diagnosis present

## 2019-01-26 DIAGNOSIS — Y840 Cardiac catheterization as the cause of abnormal reaction of the patient, or of later complication, without mention of misadventure at the time of the procedure: Secondary | ICD-10-CM | POA: Diagnosis present

## 2019-01-26 DIAGNOSIS — R531 Weakness: Secondary | ICD-10-CM | POA: Diagnosis not present

## 2019-01-26 DIAGNOSIS — E1151 Type 2 diabetes mellitus with diabetic peripheral angiopathy without gangrene: Secondary | ICD-10-CM | POA: Diagnosis present

## 2019-01-26 DIAGNOSIS — H409 Unspecified glaucoma: Secondary | ICD-10-CM | POA: Diagnosis present

## 2019-01-26 DIAGNOSIS — E119 Type 2 diabetes mellitus without complications: Secondary | ICD-10-CM | POA: Diagnosis not present

## 2019-01-26 DIAGNOSIS — I214 Non-ST elevation (NSTEMI) myocardial infarction: Secondary | ICD-10-CM | POA: Diagnosis not present

## 2019-01-26 DIAGNOSIS — I340 Nonrheumatic mitral (valve) insufficiency: Secondary | ICD-10-CM | POA: Diagnosis present

## 2019-01-26 DIAGNOSIS — F329 Major depressive disorder, single episode, unspecified: Secondary | ICD-10-CM | POA: Diagnosis present

## 2019-01-26 DIAGNOSIS — Z9181 History of falling: Secondary | ICD-10-CM

## 2019-01-26 DIAGNOSIS — I4581 Long QT syndrome: Secondary | ICD-10-CM | POA: Diagnosis present

## 2019-01-26 DIAGNOSIS — K219 Gastro-esophageal reflux disease without esophagitis: Secondary | ICD-10-CM | POA: Diagnosis present

## 2019-01-26 DIAGNOSIS — Z794 Long term (current) use of insulin: Secondary | ICD-10-CM

## 2019-01-26 DIAGNOSIS — J984 Other disorders of lung: Secondary | ICD-10-CM | POA: Diagnosis not present

## 2019-01-26 DIAGNOSIS — R7989 Other specified abnormal findings of blood chemistry: Secondary | ICD-10-CM

## 2019-01-26 DIAGNOSIS — Z20828 Contact with and (suspected) exposure to other viral communicable diseases: Secondary | ICD-10-CM | POA: Diagnosis present

## 2019-01-26 DIAGNOSIS — I272 Pulmonary hypertension, unspecified: Secondary | ICD-10-CM | POA: Diagnosis present

## 2019-01-26 DIAGNOSIS — R9431 Abnormal electrocardiogram [ECG] [EKG]: Secondary | ICD-10-CM | POA: Diagnosis not present

## 2019-01-26 DIAGNOSIS — Z833 Family history of diabetes mellitus: Secondary | ICD-10-CM

## 2019-01-26 DIAGNOSIS — I222 Subsequent non-ST elevation (NSTEMI) myocardial infarction: Secondary | ICD-10-CM | POA: Diagnosis not present

## 2019-01-26 DIAGNOSIS — E890 Postprocedural hypothyroidism: Secondary | ICD-10-CM | POA: Diagnosis present

## 2019-01-26 DIAGNOSIS — I361 Nonrheumatic tricuspid (valve) insufficiency: Secondary | ICD-10-CM | POA: Diagnosis not present

## 2019-01-26 DIAGNOSIS — I255 Ischemic cardiomyopathy: Secondary | ICD-10-CM | POA: Diagnosis present

## 2019-01-26 DIAGNOSIS — Z79899 Other long term (current) drug therapy: Secondary | ICD-10-CM

## 2019-01-26 DIAGNOSIS — I10 Essential (primary) hypertension: Secondary | ICD-10-CM | POA: Diagnosis present

## 2019-01-26 DIAGNOSIS — I429 Cardiomyopathy, unspecified: Secondary | ICD-10-CM | POA: Diagnosis not present

## 2019-01-26 DIAGNOSIS — Z888 Allergy status to other drugs, medicaments and biological substances status: Secondary | ICD-10-CM

## 2019-01-26 DIAGNOSIS — I2510 Atherosclerotic heart disease of native coronary artery without angina pectoris: Secondary | ICD-10-CM | POA: Diagnosis not present

## 2019-01-26 DIAGNOSIS — I25110 Atherosclerotic heart disease of native coronary artery with unstable angina pectoris: Secondary | ICD-10-CM | POA: Diagnosis not present

## 2019-01-26 DIAGNOSIS — Z886 Allergy status to analgesic agent status: Secondary | ICD-10-CM

## 2019-01-26 LAB — TROPONIN I (HIGH SENSITIVITY)
Troponin I (High Sensitivity): 245 ng/L (ref <18)
Troponin I (High Sensitivity): 562 ng/L (ref <18)
Troponin I (High Sensitivity): 769 ng/L (ref <18)
Troponin I (High Sensitivity): 836 ng/L (ref <18)

## 2019-01-26 LAB — URINALYSIS, COMPLETE (UACMP) WITH MICROSCOPIC
Bacteria, UA: NONE SEEN
Bilirubin Urine: NEGATIVE
Glucose, UA: 50 mg/dL — AB
Hgb urine dipstick: NEGATIVE
Ketones, ur: NEGATIVE mg/dL
Leukocytes,Ua: NEGATIVE
Nitrite: NEGATIVE
Protein, ur: NEGATIVE mg/dL
Specific Gravity, Urine: 1.013 (ref 1.005–1.030)
Squamous Epithelial / LPF: NONE SEEN (ref 0–5)
pH: 5 (ref 5.0–8.0)

## 2019-01-26 LAB — GLUCOSE, CAPILLARY
Glucose-Capillary: 143 mg/dL — ABNORMAL HIGH (ref 70–99)
Glucose-Capillary: 196 mg/dL — ABNORMAL HIGH (ref 70–99)

## 2019-01-26 LAB — COMPREHENSIVE METABOLIC PANEL
ALT: 19 U/L (ref 0–44)
AST: 20 U/L (ref 15–41)
Albumin: 4.1 g/dL (ref 3.5–5.0)
Alkaline Phosphatase: 47 U/L (ref 38–126)
Anion gap: 9 (ref 5–15)
BUN: 18 mg/dL (ref 8–23)
CO2: 24 mmol/L (ref 22–32)
Calcium: 8.9 mg/dL (ref 8.9–10.3)
Chloride: 106 mmol/L (ref 98–111)
Creatinine, Ser: 1.13 mg/dL — ABNORMAL HIGH (ref 0.44–1.00)
GFR calc Af Amer: 54 mL/min — ABNORMAL LOW (ref >60)
GFR calc non Af Amer: 47 mL/min — ABNORMAL LOW (ref >60)
Glucose, Bld: 239 mg/dL — ABNORMAL HIGH (ref 70–99)
Potassium: 5.1 mmol/L (ref 3.5–5.1)
Sodium: 139 mmol/L (ref 135–145)
Total Bilirubin: 0.5 mg/dL (ref 0.3–1.2)
Total Protein: 7 g/dL (ref 6.5–8.1)

## 2019-01-26 LAB — CBC WITH DIFFERENTIAL/PLATELET
Abs Immature Granulocytes: 0.05 10*3/uL (ref 0.00–0.07)
Basophils Absolute: 0 10*3/uL (ref 0.0–0.1)
Basophils Relative: 0 %
Eosinophils Absolute: 0.1 10*3/uL (ref 0.0–0.5)
Eosinophils Relative: 1 %
HCT: 32.5 % — ABNORMAL LOW (ref 36.0–46.0)
Hemoglobin: 10.8 g/dL — ABNORMAL LOW (ref 12.0–15.0)
Immature Granulocytes: 1 %
Lymphocytes Relative: 18 %
Lymphs Abs: 1.9 10*3/uL (ref 0.7–4.0)
MCH: 32.8 pg (ref 26.0–34.0)
MCHC: 33.2 g/dL (ref 30.0–36.0)
MCV: 98.8 fL (ref 80.0–100.0)
Monocytes Absolute: 0.6 10*3/uL (ref 0.1–1.0)
Monocytes Relative: 6 %
Neutro Abs: 8 10*3/uL — ABNORMAL HIGH (ref 1.7–7.7)
Neutrophils Relative %: 74 %
Platelets: 274 10*3/uL (ref 150–400)
RBC: 3.29 MIL/uL — ABNORMAL LOW (ref 3.87–5.11)
RDW: 12.3 % (ref 11.5–15.5)
WBC: 10.7 10*3/uL — ABNORMAL HIGH (ref 4.0–10.5)
nRBC: 0 % (ref 0.0–0.2)

## 2019-01-26 LAB — HEMOGLOBIN A1C
Hgb A1c MFr Bld: 6.8 % — ABNORMAL HIGH (ref 4.8–5.6)
Mean Plasma Glucose: 148.46 mg/dL

## 2019-01-26 LAB — SARS CORONAVIRUS 2 BY RT PCR (HOSPITAL ORDER, PERFORMED IN ~~LOC~~ HOSPITAL LAB): SARS Coronavirus 2: NEGATIVE

## 2019-01-26 LAB — LIPASE, BLOOD: Lipase: 35 U/L (ref 11–51)

## 2019-01-26 LAB — PROTIME-INR
INR: 1 (ref 0.8–1.2)
Prothrombin Time: 13.5 seconds (ref 11.4–15.2)

## 2019-01-26 LAB — APTT: aPTT: 30 seconds (ref 24–36)

## 2019-01-26 LAB — TSH: TSH: 1.648 u[IU]/mL (ref 0.350–4.500)

## 2019-01-26 MED ORDER — RANOLAZINE ER 500 MG PO TB12
1000.0000 mg | ORAL_TABLET | Freq: Two times a day (BID) | ORAL | Status: DC
  Administered 2019-01-26 – 2019-01-27 (×3): 1000 mg via ORAL
  Filled 2019-01-26 (×4): qty 2

## 2019-01-26 MED ORDER — ASPIRIN EC 81 MG PO TBEC
81.0000 mg | DELAYED_RELEASE_TABLET | Freq: Every day | ORAL | Status: DC
  Administered 2019-01-27 – 2019-01-29 (×3): 81 mg via ORAL
  Filled 2019-01-26 (×3): qty 1

## 2019-01-26 MED ORDER — LISINOPRIL 5 MG PO TABS
5.0000 mg | ORAL_TABLET | Freq: Every day | ORAL | Status: DC
  Administered 2019-01-26 – 2019-01-29 (×3): 5 mg via ORAL
  Filled 2019-01-26 (×3): qty 1

## 2019-01-26 MED ORDER — PANTOPRAZOLE SODIUM 40 MG PO TBEC
40.0000 mg | DELAYED_RELEASE_TABLET | Freq: Every day | ORAL | Status: DC
  Administered 2019-01-26 – 2019-01-29 (×2): 40 mg via ORAL
  Filled 2019-01-26 (×3): qty 1

## 2019-01-26 MED ORDER — SODIUM CHLORIDE 0.9 % IV SOLN: 250.0000 mL | INTRAVENOUS | Status: DC | PRN

## 2019-01-26 MED ORDER — LORAZEPAM 2 MG PO TABS
2.5000 mg | ORAL_TABLET | Freq: Every day | ORAL | Status: DC
  Administered 2019-01-26 – 2019-01-28 (×3): 2.5 mg via ORAL
  Filled 2019-01-26 (×3): qty 1

## 2019-01-26 MED ORDER — NITROGLYCERIN 0.4 MG SL SUBL: 0.4000 mg | SUBLINGUAL_TABLET | SUBLINGUAL | Status: DC | PRN

## 2019-01-26 MED ORDER — CARVEDILOL 3.125 MG PO TABS
3.1250 mg | ORAL_TABLET | Freq: Two times a day (BID) | ORAL | Status: DC
  Administered 2019-01-27 – 2019-01-29 (×5): 3.125 mg via ORAL
  Filled 2019-01-26 (×5): qty 1

## 2019-01-26 MED ORDER — SERTRALINE HCL 50 MG PO TABS
25.0000 mg | ORAL_TABLET | Freq: Every day | ORAL | Status: DC
  Administered 2019-01-26 – 2019-01-29 (×3): 25 mg via ORAL
  Filled 2019-01-26 (×4): qty 1

## 2019-01-26 MED ORDER — SODIUM CHLORIDE 0.9% FLUSH
3.0000 mL | Freq: Two times a day (BID) | INTRAVENOUS | Status: DC
  Administered 2019-01-26 – 2019-01-29 (×6): 3 mL via INTRAVENOUS

## 2019-01-26 MED ORDER — ONDANSETRON 4 MG PO TBDP
4.0000 mg | ORAL_TABLET | Freq: Three times a day (TID) | ORAL | Status: DC | PRN
  Filled 2019-01-26: qty 1

## 2019-01-26 MED ORDER — ROSUVASTATIN CALCIUM 5 MG PO TABS
5.0000 mg | ORAL_TABLET | Freq: Every day | ORAL | Status: DC
  Administered 2019-01-27: 5 mg via ORAL
  Filled 2019-01-26: qty 1

## 2019-01-26 MED ORDER — SODIUM CHLORIDE 0.9% FLUSH: 3.0000 mL | INTRAVENOUS | Status: DC | PRN

## 2019-01-26 MED ORDER — INSULIN ASPART 100 UNIT/ML ~~LOC~~ SOLN
0.0000 [IU] | Freq: Three times a day (TID) | SUBCUTANEOUS | Status: DC
  Administered 2019-01-26: 3 [IU] via SUBCUTANEOUS
  Administered 2019-01-27: 4 [IU] via SUBCUTANEOUS
  Administered 2019-01-28: 3 [IU] via SUBCUTANEOUS
  Administered 2019-01-29 (×2): 4 [IU] via SUBCUTANEOUS
  Filled 2019-01-26 (×5): qty 1

## 2019-01-26 MED ORDER — LATANOPROST 0.005 % OP SOLN
1.0000 [drp] | Freq: Every day | OPHTHALMIC | Status: DC
  Administered 2019-01-26 – 2019-01-28 (×3): 1 [drp] via OPHTHALMIC
  Filled 2019-01-26: qty 2.5

## 2019-01-26 MED ORDER — POLYVINYL ALCOHOL 1.4 % OP SOLN
1.0000 [drp] | Freq: Three times a day (TID) | OPHTHALMIC | Status: DC | PRN
  Filled 2019-01-26: qty 15

## 2019-01-26 MED ORDER — ACETAMINOPHEN ER 650 MG PO TBCR: 650.0000 mg | EXTENDED_RELEASE_TABLET | Freq: Every day | ORAL | Status: DC

## 2019-01-26 MED ORDER — HEPARIN (PORCINE) 25000 UT/250ML-% IV SOLN
750.0000 [IU]/h | INTRAVENOUS | Status: DC
  Administered 2019-01-26: 16:00:00 700 [IU]/h via INTRAVENOUS
  Administered 2019-01-27: 750 [IU]/h via INTRAVENOUS
  Filled 2019-01-26 (×2): qty 250

## 2019-01-26 MED ORDER — MECLIZINE HCL 25 MG PO TABS
25.0000 mg | ORAL_TABLET | Freq: Every day | ORAL | Status: DC | PRN
  Filled 2019-01-26: qty 1

## 2019-01-26 MED ORDER — ACETAMINOPHEN 325 MG PO TABS
650.0000 mg | ORAL_TABLET | ORAL | Status: DC | PRN
  Administered 2019-01-27 – 2019-01-28 (×3): 650 mg via ORAL
  Filled 2019-01-26 (×3): qty 2

## 2019-01-26 MED ORDER — HEPARIN BOLUS VIA INFUSION
3500.0000 [IU] | Freq: Once | INTRAVENOUS | Status: AC
  Administered 2019-01-26: 16:00:00 3500 [IU] via INTRAVENOUS
  Filled 2019-01-26: qty 3500

## 2019-01-26 MED ORDER — INSULIN DETEMIR 100 UNIT/ML ~~LOC~~ SOLN
20.0000 [IU] | Freq: Every day | SUBCUTANEOUS | Status: DC
  Administered 2019-01-26 – 2019-01-27 (×2): 20 [IU] via SUBCUTANEOUS
  Filled 2019-01-26 (×3): qty 0.2

## 2019-01-26 MED ORDER — ASPIRIN 300 MG RE SUPP: 300.0000 mg | RECTAL | Status: AC

## 2019-01-26 MED ORDER — INSULIN ASPART 100 UNIT/ML ~~LOC~~ SOLN: 0.0000 [IU] | Freq: Every day | SUBCUTANEOUS | Status: DC

## 2019-01-26 MED ORDER — ALPRAZOLAM 0.25 MG PO TABS: 0.2500 mg | ORAL_TABLET | Freq: Two times a day (BID) | ORAL | Status: DC | PRN

## 2019-01-26 MED ORDER — SODIUM CHLORIDE 0.9 % IV BOLUS
500.0000 mL | Freq: Once | INTRAVENOUS | Status: AC
  Administered 2019-01-26: 500 mL via INTRAVENOUS

## 2019-01-26 MED ORDER — ONDANSETRON HCL 4 MG/2ML IJ SOLN: 4.0000 mg | Freq: Four times a day (QID) | INTRAMUSCULAR | Status: DC | PRN

## 2019-01-26 MED ORDER — MORPHINE SULFATE (PF) 2 MG/ML IV SOLN: 2.0000 mg | INTRAVENOUS | Status: DC | PRN

## 2019-01-26 MED ORDER — LEVOTHYROXINE SODIUM 88 MCG PO TABS
88.0000 ug | ORAL_TABLET | Freq: Every day | ORAL | Status: DC
  Administered 2019-01-27 – 2019-01-29 (×3): 88 ug via ORAL
  Filled 2019-01-26 (×4): qty 1

## 2019-01-26 MED ORDER — ASPIRIN 81 MG PO CHEW
324.0000 mg | CHEWABLE_TABLET | ORAL | Status: AC
  Administered 2019-01-26: 324 mg via ORAL
  Filled 2019-01-26: qty 4

## 2019-01-26 NOTE — Consult Note (Signed)
[place] for Heparin dosing  Indication: ACS / STEMI  Allergies  Allergen Reactions  . Amitriptyline Other (See Comments)    Disorientation   . Cortisone Other (See Comments)    Makes eyes hurt and sugar goes up  . Lipitor [Atorvastatin]     Fatigue and weakness and high and low doses   . Macrodantin [Nitrofurantoin] Itching and Swelling  . Motrin [Ibuprofen] Other (See Comments)    Causes fluid retention    Patient Measurements: Height: 5\' 4"  (162.6 cm) Weight: 130 lb (59 kg) IBW/kg (Calculated) : 54.7 Heparin Dosing Weight: 59 kg   Vital Signs: Temp: 98.4 F (36.9 C) (06/24 1309) Temp Source: Oral (06/24 1309) BP: 125/66 (06/24 1400) Pulse Rate: 64 (06/24 1400)  Labs: Recent Labs    01/26/19 1406  HGB 10.8*  HCT 32.5*  PLT 274  CREATININE 1.13*   Troponin 245 >>  Estimated Creatinine Clearance: 36 mL/min (A) (by C-G formula based on SCr of 1.13 mg/dL (H)).   Medications:  No PTA anticoagulants - confirmed with patient.   Assessment: [name] is 70 YOF who presented with emesis this morning that was unresolved. Pharmacy was consulted for ACS/STEMI. Patient's troponin level is elevated and EKG is pending at this time.   Baseline CBC was obtained.   Goal of Therapy:  Heparin level 0.3-0.7 units/ml Monitor platelets by anticoagulation protocol: Yes   Plan:  Baseline labs have been ordered  Heparin DW:  59 kg  Give 3500 units bolus x 1 Start heparin infusion at 700 units/hr Check anti-Xa level in 8 hours and daily while on heparin, per protocol Continue to monitor H&H and platelets   Cherylee Rawlinson R Yvana Samonte 01/26/2019,3:31 PM

## 2019-01-26 NOTE — ED Notes (Signed)
Attempted to call report

## 2019-01-26 NOTE — ED Notes (Signed)
Pt given diet gingerale with MD permission

## 2019-01-26 NOTE — H&P (Signed)
[facility] NAME: Angela Barrett    MR#:  [id number]  DATE OF BIRTH:  10-08-1940  DATE OF ADMISSION:  01/26/2019  PRIMARY CARE PHYSICIAN: Burnard Bunting, MD   REQUESTING/REFERRING PHYSICIAN:   CHIEF COMPLAINT:   Chief Complaint  Patient presents with  . Emesis  . Weakness    HISTORY OF PRESENT ILLNESS: Angela Barrett  is a 78 y.o. female with a known history per below which includes diabetes, coronary artery disease status post MI, hypertension, hypothyroidism, presenting to the emergency room with generalized weakness, fatigue, lightheadedness, associated with multiple episodes of emesis, in the emergency room patient was found to have troponin greater than 240, EKG noted for normal sinus rhythm/no signs of acute ischemia, glucose 239, hospitalist asked to admit, currently feeling better, no complaints, denies chest pain/shortness of breath, case discussed with cardiology/Dr. Kallie Locks will admit for possible non-STEMI, elevated troponins, heparin drip for now.  PAST MEDICAL HISTORY:   Past Medical History:  Diagnosis Date  . Anemia    "after OHS in 01/2013" (05/25/2013)  . Anginal pain ([place])   . Anxiety   . Arthritis    "joints" (05/25/2013)  . At risk for falls   . Coronary artery disease    a. nl myoview ~ 2006;  b. 01/2013 s/p MI and CABG x 4 (VG->D2, VG->RPDA->OM3, LIMA->LAD;  c. 05/2013 Cath/PCI: LM nl, LAD 60-70p/m, D1 70p, LCX 114m, OM2 60p, RCA 50-60p/60m (3.0x33 Xience DES), VG->D2 60, VG->RPDA/OM3 100, LIMA->LAD nl w 70 dLAD, EF 50%.  . Depression   . Diabetes mellitus, type 2 ([place])    "dx'd 1992" (05/25/2013)  . Dyspnea    DOE  . Gait disturbance    uses walker some  . GERD (gastroesophageal reflux disease)   . Glaucoma    bilateral  . Headache(784.0)    "monthly" (05/25/2013)  . History of hiatal hernia   . History of pneumonia 1985  . Hyperlipidemia   . Hypertension   . Hypothyroidism   . IBS (irritable bowel  syndrome)   . Lymphocytosis 03/24/2012  . Myocardial infarction ([place])    2014  . OA (osteoarthritis)   . PONV (postoperative nausea and vomiting)   . Vertigo     PAST SURGICAL HISTORY:  Past Surgical History:  Procedure Laterality Date  . BLADDER SUSPENSION  2005  . CARDIAC CATHETERIZATION  01/2013  . CARPAL TUNNEL RELEASE Bilateral 1990-1992  . CATARACT EXTRACTION W/PHACO Left 08/17/2018   Procedure: CATARACT EXTRACTION PHACO AND INTRAOCULAR LENS PLACEMENT (IOC) LEFT, DIABETIC;  Surgeon: Birder Robson, MD;  Location: [facility] ORS;  Service: Ophthalmology;  Laterality: Left;  [place] 01:01 CDE 8.51 Fluid pack lot # [id number]  . CATARACT EXTRACTION W/PHACO Right 09/14/2018   Procedure: CATARACT EXTRACTION PHACO AND INTRAOCULAR LENS PLACEMENT (IOC) RIGHT, DIABETIC;  Surgeon: Birder Robson, MD;  Location: [facility];  Service: Ophthalmology;  Laterality: Right;  [place]  01:39 CDE 15.79 Fluid pack lot # [id number]  . COLONOSCOPY WITH PROPOFOL N/A 11/20/2015   Procedure: COLONOSCOPY WITH PROPOFOL;  Surgeon: Lucilla Lame, MD;  Location: [facility] ENDOSCOPY;  Service: Endoscopy;  Laterality: N/A;  . CORONARY ANGIOPLASTY WITH STENT PLACEMENT  05/25/2013   "1" (05/25/2013)  . CORONARY ARTERY BYPASS GRAFT N/A 01/07/2013   Procedure: CORONARY ARTERY BYPASS GRAFTING (CABG);  Surgeon: Melrose Nakayama, MD;  Location: [facility];  Service: Open Heart Surgery;  Laterality: N/A;  . ESOPHAGOGASTRODUODENOSCOPY (EGD) WITH PROPOFOL N/A 11/20/2015   Procedure: ESOPHAGOGASTRODUODENOSCOPY (EGD) WITH PROPOFOL;  Surgeon: Lucilla Lame, MD;  Location: [facility] END[facility]COPY;  Service: Endoscopy;  Laterality: N/A;  . HAMMER TOE SURGERY Right 2009  . KNEE ARTHROSCOPY Bilateral 1993-1996  . LEFT HEART CATHETERIZATION WITH CORONARY ANGIOGRAM N/A 01/05/2013   Procedure: LEFT HEART CATHETERIZATION WITH CORONARY ANGIOGRAM;  Surgeon: Wellington Hampshire, MD;  Location: [facility] CATH LAB;  Service: Cardiovascular;  Laterality: N/A;  . LEFT HEART CATHETERIZATION  WITH CORONARY/GRAFT ANGIOGRAM N/A 05/25/2013   Procedure: LEFT HEART CATHETERIZATION WITH Beatrix Fetters;  Surgeon: Wellington Hampshire, MD;  Location: [facility] CATH [facility];  Service: Cardiovascular;  Laterality: N/A;  . PERCUTANEOUS CORONARY STENT INTERVENTION (PCI-S)  05/25/2013   Procedure: PERCUTANEOUS CORONARY STENT INTERVENTION (PCI-S);  Surgeon: Wellington Hampshire, MD;  Location: [facility] CATH LAB;  Service: Cardiovascular;;  . THYROIDECTOMY  2010  . VAGINAL HYSTERECTOMY  1974    SOCIAL HISTORY:  Social History   Tobacco Use  . Smoking status: Never Smoker  . Smokeless tobacco: Never Used  Substance Use Topics  . Alcohol use: No    FAMILY HISTORY:  Family History  Problem Relation Age of Onset  . Cancer Mother        bone CA, died @ 73  . Emphysema Father        died @ 54  . Emphysema Brother        died @ 51  . Diabetes Son        alive and well.    DRUG ALLERGIES:  Allergies  Allergen Reactions  . Amitriptyline Other (See Comments)    Disorientation   . Cortisone Other (See Comments)    Makes eyes hurt and sugar goes up  . Lipitor [Atorvastatin]     Fatigue and weakness and high and low doses   . Macrodantin [Nitrofurantoin] Itching and Swelling  . Motrin [Ibuprofen] Other (See Comments)    Causes fluid retention    REVIEW OF SYSTEMS:   CONSTITUTIONAL: No fever, +fatigue, weakness.  EYES: No blurred or double vision.  EARS, NOSE, AND THROAT: No tinnitus or ear pain.  RESPIRATORY: No cough, shortness of breath, wheezing or hemoptysis.  CARDIOVASCULAR: No chest pain, orthopnea, edema.  GASTROINTESTINAL: +nausea, vomiting, resolved recent diarrhea   GENITOURINARY: No dysuria, hematuria.  ENDOCRINE: No polyuria, nocturia,  HEMATOLOGY: No anemia, easy bruising or bleeding SKIN: No rash or lesion. MUSCULOSKELETAL: No joint pain or arthritis.   NEUROLOGIC: No tingling, numbness, weakness.+  Lightheadedness PSYCHIATRY: No anxiety or depression.   MEDICATIONS AT  HOME:  Prior to Admission medications   Medication Sig Start Date End Date Taking? Authorizing Provider  acetaminophen (TYLENOL ARTHRITIS PAIN) 650 MG CR tablet Take 650 mg by mouth at bedtime.     [provider]  aspirin 81 MG tablet Take 1 tablet (81 mg total) by mouth daily. 05/26/13   Theora Gianotti, NP  B-D INS SYRINGE 0.5CC/31GX5/16 31G X 5/16" 0.5 ML MISC daily. as directed 11/03/15   [provider]  B-D ULTRAFINE III SHORT PEN 31G X 8 MM MISC daily. as directed 10/31/15   [provider]  Carboxymethylcellulose Sodium (THERATEARS) 0.25 % SOLN Place 1 drop into both eyes 3 (three) times daily as needed (for dry/irritated eyes.).    [provider]  latanoprost (XALATAN) 0.005 % ophthalmic solution Place 1 drop into both eyes at bedtime.  10/11/13   [provider]  LEVEMIR 100 UNIT/ML injection Inject 20 Units into the skin at bedtime.  10/16/17   [provider]  levothyroxine ([place]) 88  MCG tablet Take 88 mcg by mouth daily before breakfast.    [provider]  lisinopril (PRINIVIL,ZESTRIL) 5 MG tablet TAKE 1 TABLET BY MOUTH EVERY DAY Patient taking differently: Take 5 mg by mouth daily with breakfast.  01/27/18   Wellington Hampshire, MD  LORazepam (ATIVAN) 1 MG tablet Take 2.5 mg by mouth at bedtime.     [provider]  meclizine (ANTIVERT) 25 MG tablet Take 25 mg by mouth daily. 08/17/18   [provider]  metFORMIN (GLUCOPHAGE-XR) 500 MG 24 hr tablet Take 1 tablet (500 mg total) by mouth daily with breakfast. Patient taking differently: Take 500 mg by mouth 2 (two) times daily.  05/26/13   Theora Gianotti, NP  nitroGLYCERIN (NITROSTAT) 0.4 MG SL tablet PLACE 1 TABLET (0.4 MG TOTAL) UNDER THE TONGUE EVERY 5 (FIVE) MINUTES AS NEEDED FOR CHEST PAIN. 09/06/18   Wellington Hampshire, MD  ondansetron (ZOFRAN ODT) 4 MG disintegrating tablet Take 1 tablet (4 mg total) by mouth every 8  (eight) hours as needed for nausea or vomiting. 12/29/17   Eula Listen, MD  ONE TOUCH ULTRA TEST test strip USE AS DIRECTED TO TEST BLOOD SUGAR 3 TIMES A DAY DX E11.51 11/13/15   [provider]  pantoprazole (PROTONIX) 40 MG tablet Take 40 mg by mouth daily before breakfast.    [provider]  ranolazine (RANEXA) 1000 MG SR tablet TAKE 1 TABLET BY MOUTH TWICE A DAY Patient taking differently: Take 1,000 mg by mouth 2 (two) times daily.  02/08/18   Wellington Hampshire, MD  rosuvastatin (CRESTOR) 5 MG tablet Take 1 tablet (5 mg total) by mouth daily with breakfast. 11/26/18   Wellington Hampshire, MD  sertraline (ZOLOFT) 25 MG tablet Take 25 mg by mouth daily. 05/01/18   [provider]      PHYSICAL EXAMINATION:   VITAL SIGNS: Blood pressure 125/66, pulse 64, temperature 98.4 F (36.9 C), temperature source Oral, resp. rate 20, height 5\' 4"  (1.626 m), weight 59 kg, SpO2 98 %.  GENERAL:  78 y.o.-year-old patient lying in the bed with no acute distress.  EYES: Pupils equal, round, reactive to light and accommodation. No scleral icterus. Extraocular muscles intact.  HEENT: Head atraumatic, normocephalic. Oropharynx and nasopharynx clear.  NECK:  Supple, no jugular venous distention. No thyroid enlargement, no tenderness.  LUNGS: Normal breath sounds bilaterally, no wheezing, rales,rhonchi or crepitation. No use of accessory muscles of respiration.  CARDIOVASCULAR: S1, S2 normal. No murmurs, rubs, or gallops.  ABDOMEN: Soft, nontender, nondistended. Bowel sounds present. No organomegaly or mass.  EXTREMITIES: No pedal edema, cyanosis, or clubbing.  NEUROLOGIC: Cranial nerves II through XII are intact. Muscle strength 5/5 in all extremities. Sensation intact. Gait not checked.  PSYCHIATRIC: The patient is alert and oriented x 3.  SKIN: No obvious rash, lesion, or ulcer.   LABORATORY PANEL:   CBC Recent Labs  Lab 01/26/19 1406  WBC 10.7*  HGB 10.8*  HCT 32.5*   PLT 274  MCV 98.8  MCH 32.8  MCHC 33.2  RDW 12.3  LYMPHSABS 1.9  MONOABS 0.6  EOSABS 0.1  BASOSABS 0.0   ------------------------------------------------------------------------------------------------------------------  Chemistries  Recent Labs  Lab 01/26/19 1406  NA 139  K 5.1  CL 106  CO2 24  GLUCOSE 239*  BUN 18  CREATININE 1.13*  CALCIUM 8.9  AST 20  ALT 19  ALKPHOS 47  BILITOT 0.5   ------------------------------------------------------------------------------------------------------------------ estimated creatinine clearance is 36 mL/min (A) (by C-G formula  based on SCr of 1.13 mg/dL (H)). ------------------------------------------------------------------------------------------------------------------ No results for input(s): TSH, T4TOTAL, T3FREE, THYROIDAB in the last 72 hours.  Invalid input(s): FREET3   Coagulation profile No results for input(s): INR, PROTIME in the last 168 hours. ------------------------------------------------------------------------------------------------------------------- No results for input(s): DDIMER in the last 72 hours. -------------------------------------------------------------------------------------------------------------------  Cardiac Enzymes No results for input(s): CKMB, TROPONINI, MYOGLOBIN in the last 168 hours.  Invalid input(s): CK ------------------------------------------------------------------------------------------------------------------ Invalid input(s): POCBNP  ---------------------------------------------------------------------------------------------------------------  Urinalysis    Component Value Date/Time   COLORURINE YELLOW (A) 01/26/2019 1410   APPEARANCEUR CLEAR (A) 01/26/2019 1410   LABSPEC 1.013 01/26/2019 1410   PHURINE 5.0 01/26/2019 1410   GLUCOSEU 50 (A) 01/26/2019 1410   HGBUR NEGATIVE 01/26/2019 1410   BILIRUBINUR NEGATIVE 01/26/2019 1410   KETONESUR NEGATIVE 01/26/2019  1410   PROTEINUR NEGATIVE 01/26/2019 1410   UROBILINOGEN 0.2 01/06/2013 2212   NITRITE NEGATIVE 01/26/2019 1410   LEUKOCYTESUR NEGATIVE 01/26/2019 1410     RADIOLOGY: Dg Chest Port 1 View  Result Date: 01/26/2019 CLINICAL DATA:  Weakness since this morning, vomiting since this morning, history coronary artery disease post MI and CABG, hypertension EXAM: PORTABLE CHEST 1 VIEW COMPARISON:  Portable exam 1427 hours compared to 08/21/2014 FINDINGS: Upper normal size of cardiac silhouette post CABG. Mediastinal contours and pulmonary vascularity normal. Atherosclerotic calcification aorta. Minimal scarring at LEFT costophrenic angle. Lungs otherwise clear. No infiltrate, pleural effusion, or pneumothorax. Mild elevation of RIGHT diaphragm. IMPRESSION: Post CABG. No acute abnormalities. Electronically Signed   By: Lavonia Dana M.D.   On: 01/26/2019 15:07    EKG: Orders placed or performed during the hospital encounter of 01/26/19  . ED EKG  . ED EKG    IMPRESSION AND PLAN:  *Acute possible non-STEMI Admit on our ACS protocol, aspirin, lisinopril, beta-blocker therapy, heparin drip, check echocardiogram, continue Ranexa, statin therapy-check lipids in the morning, cardiology consulted for expert opinion/Dr. End, nitrates as needed, IV morphine for breakthrough pain, and continue close medical monitoring  *History of CAD/MI Plan of care as stated above  *Chronic benign essential hypertension Stable Continue lisinopril, beta-blocker therapy, PRN hydralazine  *Chronic GERD Stable PPI daily  *Chronic hypothyroidism, unspecified Check TSH continue Synthroid  *Chronic GAD/depression Stable on home regiment which will be continued  *Chronic diabetes mellitus type 2 Hold metformin while in house /Scale insulin with Accu-Cheks per routine, continue Levemir   *DVT prophylaxis with Lovenox subcu GI prophylaxis-PPI daily Disposition Home in 1 to 2 days barring any complications  All  the records are reviewed and case discussed with ED provider. Management plans discussed with the patient, family and they are in agreement.  CODE STATUS:full Code Status History    Date Active Date Inactive Code Status Order ID Comments User Context   01/07/2013 1408 01/12/2013 1601 Full Code [id number]  John Giovanni, PA-C Inpatient   Advance Care Planning Activity    Advance Directive Documentation     Most Recent Value  Type of Advance Directive  Healthcare Power of Attorney  Pre-existing out of facility DNR order (yellow form or pink MOST form)  -  "MOST" Form in Place?  -       TOTAL TIME TAKING CARE OF THIS PATIENT: 35 minutes.    Avel Peace  M.D on 01/26/2019   Between 7am to 6pm - Pager - [phone]  After 6pm go to [email or web address] - password [email or web address] [facility]  Office  [phone]  CC: Primary care physician; Burnard Bunting, MD   Note: This  dictation was prepared with Dragon dictation along with smaller phrase technology. Any transcriptional errors that result from this process are unintentional.

## 2019-01-26 NOTE — Consult Note (Addendum)
Cardiology Consultation:   Patient ID: Angela Barrett; [id number]; [birth year]   Admit date: 01/26/2019 Date of Consult: 01/26/2019  Primary Care Provider: Burnard Bunting, MD Primary Cardiologist: Fletcher Anon   Patient Profile:   Angela Barrett is a 78 y.o. female with a hx of CAD status post four-vessel CABG in 01/2013 status post PCI to the native proximal and mid RCA in 05/2013, diabetes, hypertension, hyperlipidemia, hypothyroidism, IBS, anxiety, dizziness/gait disturbance, vertigo depression, and GERD who is being seen today for the evaluation of elevated troponin at the request of Dr. Jerelyn Charles.  History of Present Illness:   Angela Barrett was admitted with a non-STEMI in 01/2013 cardiac catheterization revealing multivessel CAD.  She underwent four-vessel CABG at that time with LIMA to LAD, vein graft to diagonal, vein graft to RPDA to OM3.  She had recurrent anginal pain post CABG with repeat cardiac cath in 05/2013 showing significant three-vessel CAD with diffuse diabetic vessels.  SVG to RPDA/OM 3 was found to be occluded.  The OM 3 got collaterals from the RCA.  EF was low normal.  She underwent successful PCI/DES to the proximal and mid RCA.  Left circumflex occlusion was left to be managed medically.  Most recent nuclear stress test in 08/2014 was normal.  Most recent echo from 03/2015 showed normal LV systolic function, mild to moderate mitral regurgitation, and mild pulmonary hypertension.  She was most recently seen in the office in 10/2018 and was not doing well.  She has noticed a several month history of dizziness with difficulty ambulating.  This dizziness persists even today with uncertain etiology.  Dizziness has been positional at times.  Outpatient cardiac monitoring in 10/2018 showed sinus rhythm with an average heart rate of 64 bpm, 3 short runs of SVT, rare PVCs and PACs.  There was documented patient reported syncope with no significant arrhythmia at that time with the patient being in  sinus rhythm with a heart rate of 68 bpm with reported syncope.Marland Kitchen  MRI of the brain in 11/2018 showed atrophy with small vessel disease that was progressive from 2016 without acute intracranial findings.  This morning, patient woke up and did not feel well.  She states that she was dizzy and jittery.  She thought her blood sugar was low though upon checking this it was noted to be significantly elevated at 300.  Patient states that she ate some ice cream the night before and typically does not do this.  She denies ever having any chest pain or shortness of breath.  She has had an episode of diarrhea a couple days prior as well as associated nausea and vomiting this morning.  The symptoms are not similar to how she felt leading up to her bypass or subsequent PCI.  Upon the patient's arrival to [facility] they were found to have stable vitals. EKG as below, CXR showed no acute abnormalities. Labs showed high-sensitivity troponin 245, glucose 239, serum creatinine 1.13, potassium 5.1, AST/ALT normal, albumin 4.1, lipase 35, WBC 10.7, Hgb 10.8.  She continues to deny any chest pain or shortness of breath in the ED.  She received a 500 cc normal saline bolus in the ED.  Upon admission she was started on heparin infusion and cardiology was consulted.  Past Medical History:  Diagnosis Date   Anemia    "after OHS in 01/2013" (05/25/2013)   Anginal pain ([place])    Anxiety    Arthritis    "joints" (05/25/2013)   At risk for falls  Coronary artery disease    a. nl myoview ~ 2006;  b. 01/2013 s/p MI and CABG x 4 (VG->D2, VG->RPDA->OM3, LIMA->LAD;  c. 05/2013 Cath/PCI: LM nl, LAD 60-70p/m, D1 70p, LCX 137m, OM2 60p, RCA 50-60p/8m (3.0x33 Xience DES), VG->D2 60, VG->RPDA/OM3 100, LIMA->LAD nl w 70 dLAD, EF 50%.   Depression    Diabetes mellitus, type 2 ([place])    "dx'd 1992" (05/25/2013)   Dyspnea    DOE   Gait disturbance    uses walker some   GERD (gastroesophageal reflux disease)    Glaucoma     bilateral   Headache(784.0)    "monthly" (05/25/2013)   History of hiatal hernia    History of pneumonia 1985   Hyperlipidemia    Hypertension    Hypothyroidism    IBS (irritable bowel syndrome)    Lymphocytosis 03/24/2012   Myocardial infarction ([place])    2014   OA (osteoarthritis)    PONV (postoperative nausea and vomiting)    Vertigo     Past Surgical History:  Procedure Laterality Date   BLADDER SUSPENSION  2005   CARDIAC CATHETERIZATION  01/2013   CARPAL TUNNEL RELEASE Bilateral 1990-1992   CATARACT EXTRACTION W/PHACO Left 08/17/2018   Procedure: CATARACT EXTRACTION PHACO AND INTRAOCULAR LENS PLACEMENT ([place]) LEFT, DIABETIC;  Surgeon: Birder Robson, MD;  Location: [facility] ORS;  Service: Ophthalmology;  Laterality: Left;  [place] 01:01 CDE 8.51 Fluid pack lot # [id number]   CATARACT EXTRACTION W/PHACO Right 09/14/2018   Procedure: CATARACT EXTRACTION PHACO AND INTRAOCULAR LENS PLACEMENT ([facility]) RIGHT, DIABETIC;  Surgeon: Birder Robson, MD;  Location: [facility] ORS;  Service: Ophthalmology;  Laterality: Right;  [place]  01:39 CDE 15.79 Fluid pack lot # [id number]   COLONOSCOPY WITH PROPOFOL N/A 11/20/2015   Procedure: COLONOSCOPY WITH PROPOFOL;  Surgeon: Lucilla Lame, MD;  Location: [facility] ENDOSCOPY;  Service: Endoscopy;  Laterality: N/A;   CORONARY ANGIOPLASTY WITH STENT PLACEMENT  05/25/2013   "1" (05/25/2013)   CORONARY ARTERY BYPASS GRAFT N/A 01/07/2013   Procedure: CORONARY ARTERY BYPASS GRAFTING (CABG);  Surgeon: Melrose Nakayama, MD;  Location: [facility];  Service: Open Heart Surgery;  Laterality: N/A;   ESOPHAGOGASTRODUODENOSCOPY (EGD) WITH PROPOFOL N/A 11/20/2015   Procedure: ESOPHAGOGASTRODUODENOSCOPY (EGD) WITH PROPOFOL;  Surgeon: Lucilla Lame, MD;  Location: [facility] ENDOSCOPY;  Service: Endoscopy;  Laterality: N/A;   HAMMER TOE SURGERY Right 2009   KNEE ARTHROSCOPY Bilateral 1993-1996   LEFT HEART CATHETERIZATION WITH CORONARY ANGIOGRAM N/A 01/05/2013   Procedure: LEFT  HEART CATHETERIZATION WITH CORONARY ANGIOGRAM;  Surgeon: Wellington Hampshire, MD;  Location: [facility] CATH LAB;  Service: Cardiovascular;  Laterality: N/A;   LEFT HEART CATHETERIZATION WITH CORONARY/GRAFT ANGIOGRAM N/A 05/25/2013   Procedure: LEFT HEART CATHETERIZATION WITH Beatrix Fetters;  Surgeon: Wellington Hampshire, MD;  Location: [facility] CATH LAB;  Service: Cardiovascular;  Laterality: N/A;   PERCUTANEOUS CORONARY STENT INTERVENTION (PCI-S)  05/25/2013   Procedure: PERCUTANEOUS CORONARY STENT INTERVENTION (PCI-S);  Surgeon: Wellington Hampshire, MD;  Location: [facility] CATH LAB;  Service: Cardiovascular;;   THYROIDECTOMY  2010   VAGINAL HYSTERECTOMY  1974     Home Meds: Prior to Admission medications   Medication Sig Start Date End Date Taking? Authorizing Provider  acetaminophen (TYLENOL ARTHRITIS PAIN) 650 MG CR tablet Take 650 mg by mouth at bedtime.     [provider]  aspirin 81 MG tablet Take 1 tablet (81 mg total) by mouth daily. 05/26/13   Theora Gianotti, NP  B-D INS SYRINGE 0.5CC/31GX5/16 31G X 5/16" 0.5 ML  MISC daily. as directed 11/03/15   [provider]  B-D ULTRAFINE III SHORT PEN 31G X 8 MM MISC daily. as directed 10/31/15   [provider]  Carboxymethylcellulose Sodium (THERATEARS) 0.25 % SOLN Place 1 drop into both eyes 3 (three) times daily as needed (for dry/irritated eyes.).    [provider]  latanoprost (XALATAN) 0.005 % ophthalmic solution Place 1 drop into both eyes at bedtime.  10/11/13   [provider]  LEVEMIR 100 UNIT/ML injection Inject 20 Units into the skin at bedtime.  10/16/17   [provider]  levothyroxine (SYNTHROID, LEVOTHROID) 88 MCG tablet Take 88 mcg by mouth daily before breakfast.    [provider]  lisinopril (PRINIVIL,ZESTRIL) 5 MG tablet TAKE 1 TABLET BY MOUTH EVERY DAY Patient taking differently: Take 5 mg by mouth daily with breakfast.  01/27/18   Wellington Hampshire, MD  LORazepam  (ATIVAN) 1 MG tablet Take 2.5 mg by mouth at bedtime.     [provider]  meclizine (ANTIVERT) 25 MG tablet Take 25 mg by mouth daily. 08/17/18   [provider]  metFORMIN (GLUCOPHAGE-XR) 500 MG 24 hr tablet Take 1 tablet (500 mg total) by mouth daily with breakfast. Patient taking differently: Take 500 mg by mouth 2 (two) times daily.  05/26/13   Theora Gianotti, NP  nitroGLYCERIN (NITROSTAT) 0.4 MG SL tablet PLACE 1 TABLET (0.4 MG TOTAL) UNDER THE TONGUE EVERY 5 (FIVE) MINUTES AS NEEDED FOR CHEST PAIN. 09/06/18   Wellington Hampshire, MD  ondansetron (ZOFRAN ODT) 4 MG disintegrating tablet Take 1 tablet (4 mg total) by mouth every 8 (eight) hours as needed for nausea or vomiting. 12/29/17   Eula Listen, MD  ONE TOUCH ULTRA TEST test strip USE AS DIRECTED TO TEST BLOOD SUGAR 3 TIMES A DAY DX E11.51 11/13/15   [provider]  pantoprazole (PROTONIX) 40 MG tablet Take 40 mg by mouth daily before breakfast.    [provider]  ranolazine (RANEXA) 1000 MG SR tablet TAKE 1 TABLET BY MOUTH TWICE A DAY Patient taking differently: Take 1,000 mg by mouth 2 (two) times daily.  02/08/18   Wellington Hampshire, MD  rosuvastatin (CRESTOR) 5 MG tablet Take 1 tablet (5 mg total) by mouth daily with breakfast. 11/26/18   Wellington Hampshire, MD  sertraline (ZOLOFT) 25 MG tablet Take 25 mg by mouth daily. 05/01/18   [provider]    Inpatient Medications: Scheduled Meds:  heparin  3,500 Units Intravenous Once   Continuous Infusions:  heparin     PRN Meds:   Allergies:   Allergies  Allergen Reactions   Amitriptyline Other (See Comments)    Disorientation    Cortisone Other (See Comments)    Makes eyes hurt and sugar goes up   Lipitor [Atorvastatin]     Fatigue and weakness and high and low doses    Macrodantin [Nitrofurantoin] Itching and Swelling   Motrin [Ibuprofen] Other (See Comments)    Causes fluid retention    Social History:     Social History   Socioeconomic History   Marital status: Married    Spouse name: Not on file   Number of children: Not on file   Years of education: Not on file   Highest education level: Not on file  Occupational History   Not on file  Social Needs   Financial resource strain: Not on file   Food insecurity    Worry: Not on file  Inability: Not on file   Transportation needs    Medical: Not on file    Non-medical: Not on file  Tobacco Use   Smoking status: Never Smoker   Smokeless tobacco: Never Used  Substance and Sexual Activity   Alcohol use: No   Drug use: No   Sexual activity: Not Currently  Lifestyle   Physical activity    Days per week: Not on file    Minutes per session: Not on file   Stress: Not on file  Relationships   Social connections    Talks on phone: Not on file    Gets together: Not on file    Attends religious service: Not on file    Active member of club or organization: Not on file    Attends meetings of clubs or organizations: Not on file    Relationship status: Not on file   Intimate partner violence    Fear of current or ex partner: Not on file    Emotionally abused: Not on file    Physically abused: Not on file    Forced sexual activity: Not on file  Other Topics Concern   Not on file  Social History Narrative   Lives in [place] with husband.     Family History:   Family History  Problem Relation Age of Onset   Cancer Mother        bone CA, died @ 30   Emphysema Father        died @ 48   Emphysema Brother        died @ 53   Diabetes Son        alive and well.    ROS:  Review of Systems  Constitutional: Positive for malaise/fatigue. Negative for chills, diaphoresis, fever and weight loss.  HENT: Negative for congestion.   Eyes: Negative for discharge and redness.  Respiratory: Negative for cough, hemoptysis, sputum production, shortness of breath and wheezing.   Cardiovascular: Negative for chest  pain, palpitations, orthopnea, claudication, leg swelling and PND.  Gastrointestinal: Positive for diarrhea, nausea and vomiting. Negative for abdominal pain, blood in stool, constipation, heartburn and melena.  Genitourinary: Negative for hematuria.  Musculoskeletal: Negative for falls and myalgias.  Skin: Negative for rash.  Neurological: Positive for dizziness, tremors and weakness. Negative for tingling, sensory change, speech change, focal weakness and loss of consciousness.  Endo/Heme/Allergies: Does not bruise/bleed easily.  Psychiatric/Behavioral: Negative for substance abuse. The patient is not nervous/anxious.   All other systems reviewed and are negative.     Physical Exam/Data:   Vitals:   01/26/19 1309 01/26/19 1400  BP:  125/66  Pulse: 65 64  Resp:  20  Temp: 98.4 F (36.9 C)   TempSrc: Oral   SpO2: 97% 98%  Weight: 59 kg   Height: 5\' 4"  (1.626 m)    No intake or output data in the 24 hours ending 01/26/19 1614 Filed Weights   01/26/19 1309  Weight: 59 kg   Body mass index is 22.31 kg/m.   Physical Exam: General: Well developed, well nourished, in no acute distress. Head: Normocephalic, atraumatic, sclera non-icteric, no xanthomas, nares without discharge.  Neck: Negative for carotid bruits. JVD not elevated. Lungs: Clear bilaterally to auscultation without wheezes, rales, or rhonchi. Breathing is unlabored. Heart: RRR with S1 S2. No murmurs, rubs, or gallops appreciated. Abdomen: Soft, non-tender, non-distended with normoactive bowel sounds. No hepatomegaly. No rebound/guarding. No obvious abdominal masses. Msk:  Strength and tone appear normal for age.  Extremities: No clubbing or cyanosis. No edema. Distal pedal pulses are 2+ and equal bilaterally. Neuro: Alert and oriented X 3. No facial asymmetry. No focal deficit. Moves all extremities spontaneously. Psych:  Responds to questions appropriately with a normal affect.   EKG:  The EKG was personally  reviewed and demonstrates: NSR, 66 bpm, nonspecific lateral ST-T changes Telemetry:  Telemetry was personally reviewed and demonstrates: Sinus rhythm  Weights: Filed Weights   01/26/19 1309  Weight: 59 kg    Relevant CV Studies: [facility] 10/2018: Normal sinus rhythm with an average heart rate of 64 bpm. 3 short runs of SVTs. Rare PVCs and PACs. Reported syncope with no significant arrhythmia as the patient was in sinus rhythm at that time with a heart rate of 68 bpm.  Laboratory Data:  Chemistry Recent Labs  Lab 01/26/19 1406  NA 139  K 5.1  CL 106  CO2 24  GLUCOSE 239*  BUN 18  CREATININE 1.13*  CALCIUM 8.9  GFRNONAA 47*  GFRAA 54*  ANIONGAP 9    Recent Labs  Lab 01/26/19 1406  PROT 7.0  ALBUMIN 4.1  AST 20  ALT 19  ALKPHOS 47  BILITOT 0.5   Hematology Recent Labs  Lab 01/26/19 1406  WBC 10.7*  RBC 3.29*  HGB 10.8*  HCT 32.5*  MCV 98.8  MCH 32.8  MCHC 33.2  RDW 12.3  PLT 274   Cardiac EnzymesNo results for input(s): TROPONINI in the last 168 hours. No results for input(s): TROPIPOC in the last 168 hours.  BNPNo results for input(s): BNP, PROBNP in the last 168 hours.  DDimer No results for input(s): DDIMER in the last 168 hours.  Radiology/Studies:  Dg Chest Port 1 View  Result Date: 01/26/2019 IMPRESSION: Post CABG. No acute abnormalities. Electronically Signed   By: Lavonia Dana M.D.   On: 01/26/2019 15:07    Assessment and Plan:   1.  Elevated high-sensitivity troponin/CAD status post CABG status post PCI: -Elevated high-sensitivity troponin of uncertain significance given lack of anginal symptoms -Cannot exclude run of SVT as this was previously noted on [facility] playing a role in her jitteriness/dizziness as well as her elevated troponin -Continue to cycle troponin -Heparin drip has been initiated by internal medicine if repeat high-sensitivity troponin is stable to downtrending could consider discontinuation of heparin drip -If repeat troponin is  not significantly elevated could consider Lexiscan Myoview on 6/25.  However, if she does have significant rise in troponin would recommend continuation of heparin drip cardiac catheterization prior to discharge -ASA, Coreg, Ranexa, Crestor  2.  Dizziness/reported syncope: -Patient had a reported syncopal episode while wearing cardiac monitoring in 10/2018 which did not show any significant arrhythmia or pauses.  Patient was noted to be in sinus rhythm with a heart rate of 60 bpm at the time of the reported syncopal event -She has known history of vertigo and has reported PT to be somewhat beneficial -Continue to monitor on telemetry -Cannot exclude some component of diabetes versus recent cataract surgery with associated vision loss in the right eye versus underlying known vertigo  3. Paroxysmal SVT: -Possibly playing a role in the above -Monitor tele -Consider repeat outpatient cardiac monitoring   4.  Nausea/vomiting/diarrhea: -Per IM  5.  Hypertension: -Blood pressure reasonably controlled -Continue prior to admission medications  6.  Hyperlipidemia: -Tolerating low-dose Crestor with prior intolerance to statins   For questions or updates, please contact [place] Please consult [email or web address] for contact info under Cardiology/STEMI.   Signed,  Christell Faith, PA-C [place] Pager: [phone] 01/26/2019, 4:14 PM

## 2019-01-26 NOTE — ED Notes (Signed)
Attempted IV access x1 time with no success. Float RN notified and will come start line and verify heparin

## 2019-01-26 NOTE — ED Provider Notes (Signed)
[facility] Emergency Department Provider Note  ____________________________________________   I have reviewed the triage vital signs and the nursing notes. Where available I have reviewed prior notes and, if possible and indicated, outside hospital notes.    HISTORY  Chief Complaint Emesis and Weakness    HPI Angela Barrett is a 78 y.o. female  Patient seen and evaluated during the coronavirus epidemic during a time with low staffing history of DM, history of CAD, history of anxiety, anemia urinary tract infections and other medical problems listed below presents today after having vomiting x2 this morning and feeling generally weak.  No chest pain or shortness of breath no nausea no vomiting at this time.  No abdominal pain or diarrhea.  She states she feels "okay".  She did feel like her sugar was low this morning but her sugar was over 200.  She did take her normal insulin last night.  She did try orange juice and threw it up.  She longer feels nauseated she feels "okay".  She felt pretty good yesterday as well.   Past Medical History:  Diagnosis Date  . Anemia    "after OHS in 01/2013" (05/25/2013)  . Anginal pain ([place])   . Anxiety   . Arthritis    "joints" (05/25/2013)  . At risk for falls   . Coronary artery disease    a. nl myoview ~ 2006;  b. 01/2013 s/p MI and CABG x 4 (VG->D2, VG->RPDA->OM3, LIMA->LAD;  c. 05/2013 Cath/PCI: LM nl, LAD 60-70p/m, D1 70p, LCX 147m, OM2 60p, RCA 50-60p/75m (3.0x33 Xience DES), VG->D2 60, VG->RPDA/OM3 100, LIMA->LAD nl w 70 dLAD, EF 50%.  . Depression   . Diabetes mellitus, type 2 ([place])    "dx'd 1992" (05/25/2013)  . Dyspnea    DOE  . Gait disturbance    uses walker some  . GERD (gastroesophageal reflux disease)   . Glaucoma    bilateral  . Headache(784.0)    "monthly" (05/25/2013)  . History of hiatal hernia   . History of pneumonia 1985  . Hyperlipidemia   . Hypertension   . Hypothyroidism   . IBS  (irritable bowel syndrome)   . Lymphocytosis 03/24/2012  . Myocardial infarction ([place])    2014  . OA (osteoarthritis)   . PONV (postoperative nausea and vomiting)   . Vertigo     Patient Active Problem List   Diagnosis Date Noted  . Noninfectious diarrhea   . Problems with swallowing and mastication   . PAD (peripheral artery disease) ([place]) 09/21/2015  . Palpitations 03/12/2015  . Bilateral leg pain 03/12/2015  . Leg edema 04/14/2014  . Unstable angina ([place]) 05/26/2013  . Hypothyroidism   . Diabetes mellitus, type 2 ([place])   . Hypertension   . Coronary artery disease   . GERD (gastroesophageal reflux disease)   . Hyperlipidemia   . NSTEMI (non-ST elevated myocardial infarction) ([place]) 01/12/2013  . 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 Diabetes ([place]) 01/12/2013  . Unspecified hypothyroidism 01/12/2013  . Lymphocytosis 03/24/2012    Past Surgical History:  Procedure Laterality Date  . BLADDER SUSPENSION  2005  . CARDIAC CATHETERIZATION  01/2013  . CARPAL TUNNEL RELEASE Bilateral 1990-1992  . CATARACT EXTRACTION W/PHACO Left 08/17/2018   Procedure: CATARACT EXTRACTION PHACO AND INTRAOCULAR LENS PLACEMENT (IOC) LEFT, DIABETIC;  Surgeon: Birder Robson, MD;  Location: [facility];  Service: Ophthalmology;  Laterality: Left;  [place] 01:01 CDE 8.51 Fluid pack lot # [id number]  . CATARACT EXTRACTION W/PHACO Right  09/14/2018   Procedure: CATARACT EXTRACTION PHACO AND INTRAOCULAR LENS PLACEMENT (IOC) RIGHT, DIABETIC;  Surgeon: Birder Robson, MD;  Location: [facility];  Service: Ophthalmology;  Laterality: Right;  [place]  01:39 CDE 15.79 Fluid pack lot # [id number]  . COLONOSCOPY WITH PROPOFOL N/A 11/20/2015   Procedure: COLONOSCOPY WITH PROPOFOL;  Surgeon: Lucilla Lame, MD;  Location: [facility] END[facility];  Service: Endoscopy;  Laterality: N/A;  . CORONARY ANGIOPLASTY WITH STENT PLACEMENT  05/25/2013   "1" (05/25/2013)  . CORONARY ARTERY BYPASS GRAFT N/A 01/07/2013   Procedure: CORONARY ARTERY BYPASS GRAFTING  (CABG);  Surgeon: Melrose Nakayama, MD;  Location: [place];  Service: Open Heart Surgery;  Laterality: N/A;  . ESOPHAGOGASTRODUODENOSCOPY (EGD) WITH PROPOFOL N/A 11/20/2015   Procedure: ESOPHAGOGASTRODUODENOSCOPY (EGD) WITH PROPOFOL;  Surgeon: Lucilla Lame, MD;  Location: [facility];  Service: Endoscopy;  Laterality: N/A;  . HAMMER TOE SURGERY Right 2009  . KNEE ARTHROSCOPY Bilateral 1993-1996  . LEFT HEART CATHETERIZATION WITH CORONARY ANGIOGRAM N/A 01/05/2013   Procedure: LEFT HEART CATHETERIZATION WITH CORONARY ANGIOGRAM;  Surgeon: Wellington Hampshire, MD;  Location: [place] CATH LAB;  Service: Cardiovascular;  Laterality: N/A;  . LEFT HEART CATHETERIZATION WITH CORONARY/GRAFT ANGIOGRAM N/A 05/25/2013   Procedure: LEFT HEART CATHETERIZATION WITH Beatrix Fetters;  Surgeon: Wellington Hampshire, MD;  Location: [facility] CATH LAB;  Service: Cardiovascular;  Laterality: N/A;  . PERCUTANEOUS CORONARY STENT INTERVENTION (PCI-S)  05/25/2013   Procedure: PERCUTANEOUS CORONARY STENT INTERVENTION (PCI-S);  Surgeon: Wellington Hampshire, MD;  Location[facility] CATH LAB;  Service: Cardiovascular;;  . THYROIDECTOMY  2010  . VAGINAL HYSTERECTOMY  1974    Prior to Admission medications   Medication Sig Start Date End Date Taking? Authorizing Provider  acetaminophen (TYLENOL ARTHRITIS PAIN) 650 MG CR tablet Take 650 mg by mouth at bedtime.     [provider]  aspirin 81 MG tablet Take 1 tablet (81 mg total) by mouth daily. 05/26/13   Theora Gianotti, NP  B-D INS SYRINGE 0.5CC/31GX5/16 31G X 5/16" 0.5 ML MISC daily. as directed 11/03/15   [provider]  B-D ULTRAFINE III SHORT PEN 31G X 8 MM MISC daily. as directed 10/31/15   [provider]  Carboxymethylcellulose Sodium (THERATEARS) 0.25 % SOLN Place 1 drop into both eyes 3 (three) times daily as needed (for dry/irritated eyes.).    [provider]  latanoprost (XALATAN) 0.005 % ophthalmic solution Place 1 drop into both eyes  at bedtime.  10/11/13   [provider]  LEVEMIR 100 UNIT/ML injection Inject 20 Units into the skin at bedtime.  10/16/17   [provider]  levothyroxine (SYNTHROID, LEVOTHROID) 88 MCG tablet Take 88 mcg by mouth daily before breakfast.    [provider]  lisinopril (PRINIVIL,ZESTRIL) 5 MG tablet TAKE 1 TABLET BY MOUTH EVERY DAY Patient taking differently: Take 5 mg by mouth daily with breakfast.  01/27/18   Wellington Hampshire, MD  LORazepam (ATIVAN) 1 MG tablet Take 2.5 mg by mouth at bedtime.     [provider]  meclizine (ANTIVERT) 25 MG tablet Take 25 mg by mouth daily. 08/17/18   [provider]  metFORMIN (GLUCOPHAGE-XR) 500 MG 24 hr tablet Take 1 tablet (500 mg total) by mouth daily with breakfast. Patient taking differently: Take 500 mg by mouth 2 (two) times daily.  05/26/13   Theora Gianotti, NP  nitroGLYCERIN (NITROSTAT) 0.4 MG SL tablet PLACE 1 TABLET (0.4 MG TOTAL) UNDER THE TONGUE EVERY 5 (FIVE) MINUTES AS NEEDED FOR CHEST  PAIN. 09/06/18   Wellington Hampshire, MD  ondansetron (ZOFRAN ODT) 4 MG disintegrating tablet Take 1 tablet (4 mg total) by mouth every 8 (eight) hours as needed for nausea or vomiting. 12/29/17   Eula Listen, MD  ONE TOUCH ULTRA TEST test strip USE AS DIRECTED TO TEST BLOOD SUGAR 3 TIMES A DAY DX E11.51 11/13/15   [provider]  pantoprazole (PROTONIX) 40 MG tablet Take 40 mg by mouth daily before breakfast.    [provider]  ranolazine (RANEXA) 1000 MG SR tablet TAKE 1 TABLET BY MOUTH TWICE A DAY Patient taking differently: Take 1,000 mg by mouth 2 (two) times daily.  02/08/18   Wellington Hampshire, MD  rosuvastatin (CRESTOR) 5 MG tablet Take 1 tablet (5 mg total) by mouth daily with breakfast. 11/26/18   Wellington Hampshire, MD  sertraline (ZOLOFT) 25 MG tablet Take 25 mg by mouth daily. 05/01/18   [provider]    Allergies Amitriptyline, Cortisone, Lipitor [atorvastatin],  Macrodantin [nitrofurantoin], and Motrin [ibuprofen]  Family History  Problem Relation Age of Onset  . Cancer Mother        bone CA, died @ 70  . Emphysema Father        died @ 66  . Emphysema Brother        died @ 69  . Diabetes Son        alive and well.    Social History Social History   Tobacco Use  . Smoking status: Never Smoker  . Smokeless tobacco: Never Used  Substance Use Topics  . Alcohol use: No  . Drug use: No    Review of Systems Constitutional: No fever/chills Eyes: No visual changes. ENT: No sore throat. No stiff neck no neck pain Cardiovascular: Denies chest pain. Respiratory: Denies shortness of breath. Gastrointestinal:   + vomiting.  No diarrhea.  No constipation. Genitourinary: Negative for dysuria. Musculoskeletal: Negative lower extremity swelling Skin: Negative for rash. Neurological: Negative for severe headaches, focal weakness or numbness.   ____________________________________________   PHYSICAL EXAM:  VITAL SIGNS: ED Triage Vitals [01/26/19 1309]  Enc Vitals Group     BP      Pulse Rate 65     Resp      Temp 98.4 F (36.9 C)     Temp Source Oral     SpO2 97 %     Weight 130 lb (59 kg)     Height 5\' 4"  (1.626 m)     Head Circumference      Peak Flow      Pain Score 0     Pain Loc      Pain Edu?      Excl. in [place]?     Constitutional: Alert and oriented. Well appearing and in no acute distress. Eyes: Conjunctivae are normal Head: Atraumatic HEENT: No congestion/rhinnorhea. Mucous membranes are moist.  Oropharynx non-erythematous Neck:   Nontender with no meningismus, no masses, no stridor Cardiovascular: Normal rate, regular rhythm. Grossly normal heart sounds.  Good peripheral circulation. Respiratory: Normal respiratory effort.  No retractions. Lungs CTAB. Abdominal: Soft and nontender. No distention. No guarding no rebound Back:  There is no focal tenderness or step off.  there is no midline tenderness there are no  lesions noted. there is no CVA tenderness Musculoskeletal: No lower extremity tenderness, no upper extremity tenderness. No joint effusions, no DVT signs strong distal pulses no edema Neurologic:  Normal speech and language. No gross focal neurologic deficits are  appreciated.  Skin:  Skin is warm, dry and intact. No rash noted. Psychiatric: Mood and affect are normal. Speech and behavior are normal.  ____________________________________________   LABS (all labs ordered are listed, but only abnormal results are displayed)  Labs Reviewed  CBC WITH DIFFERENTIAL/PLATELET - Abnormal; Notable for the following components:      Result Value   WBC 10.7 (*)    RBC 3.29 (*)    Hemoglobin 10.8 (*)    HCT 32.5 (*)    Neutro Abs 8.0 (*)    All other components within normal limits  TROPONIN I (HIGH SENSITIVITY) - Abnormal; Notable for the following components:   Troponin I (High Sensitivity) 245 (*)    All other components within normal limits  COMPREHENSIVE METABOLIC PANEL - Abnormal; Notable for the following components:   Glucose, Bld 239 (*)    Creatinine, Ser 1.13 (*)    GFR calc non Af Amer 47 (*)    GFR calc Af Amer 54 (*)    All other components within normal limits  URINALYSIS, COMPLETE (UACMP) WITH MICROSCOPIC - Abnormal; Notable for the following components:   Color, Urine YELLOW (*)    APPearance CLEAR (*)    Glucose, UA 50 (*)    All other components within normal limits  URINE CULTURE  LIPASE, BLOOD  TROPONIN I (HIGH SENSITIVITY)    Pertinent labs  results that were available during my care of the patient were reviewed by me and considered in my medical decision making (see chart for details). ____________________________________________  EKG  I personally interpreted any EKGs ordered by me or triage Initial EKG some sinus rhythm, no acute ST elevation or depression ossific ST changes baseline effect limits interpretation, LAD  noted ____________________________________________  RADIOLOGY  Pertinent labs & imaging results that were available during my care of the patient were reviewed by me and considered in my medical decision making (see chart for details). If possible, patient and/or family made aware of any abnormal findings.  No results found. ____________________________________________    PROCEDURES  Procedure(s) performed: None  Procedures  Critical Care performed: None  ____________________________________________   INITIAL IMPRESSION / ASSESSMENT AND PLAN / ED COURSE  Pertinent labs & imaging results that were available during my care of the patient were reviewed by me and considered in my medical decision making (see chart for details).  Here with vomiting and weakness this morning feels better no focal weakness complained of her did identified nothing to suggest CVA, abdomen is benign nothing to suggest intra-abdominal pathology, lungs are clear, with vague constellation of symptoms, we are initiating diffuse work-up, urinalysis and blood work looks reassuring, this is the first day we are using high sensitive troponin.  That is 245.  Certainly possible for patient with diabetes and history of heart disease have atypical presentation given the troponin, we will talk to the hospitalist and likely have to admit for observation PT EKG at this time, chest x-ray pending, by radiologist..   ____________________________________________   FINAL CLINICAL IMPRESSION(S) / ED DIAGNOSES  Final diagnoses:  Weakness      This chart was dictated using voice recognition software.  Despite best efforts to proofread,  errors can occur which can change meaning.      Schuyler Amor, MD 01/26/19 1450

## 2019-01-26 NOTE — ED Notes (Signed)
ED TO INPATIENT HANDOFF REPORT  ED Nurse Name and Phone #:  Terrel Nesheiwat [phone]  S Name/Age/Gender Angela Barrett 78 y.o. female Room/Bed: ED02A/ED02A  Code Status   Code Status: Prior  Home/SNF/Other Home Patient oriented to: self, place, time and situation Is this baseline? Yes   Triage Complete: Triage complete  Chief Complaint weakness  Triage Note PT to ED via EMS from home. PT c/o weakness that started this morning, also vomiting this morning. PT stats she thought her sugar was low, so she drank orange juice and threw that up. She checked sugar after and it was 200's. PT ao at this time. States not nauseated.    Allergies Allergies  Allergen Reactions  . Amitriptyline Other (See Comments)    Disorientation   . Cortisone Other (See Comments)    Makes eyes hurt and sugar goes up  . Lipitor [Atorvastatin]     Fatigue and weakness and high and low doses   . Macrodantin [Nitrofurantoin] Itching and Swelling  . Motrin [Ibuprofen] Other (See Comments)    Causes fluid retention    Level of Care/Admitting Diagnosis ED Disposition    ED Disposition Condition [place]: [place] [[id number]]  Level of Care: Telemetry [5]  Covid Evaluation: N/A  Diagnosis: NSTEMI (non-ST elevated myocardial infarction) [facility]) [[id number]]  Admitting Physician: Gorden Harms [[id number]]  Attending Physician: Gorden Harms [[id number]]  Estimated length of stay: past midnight tomorrow  Certification:: I certify this patient will need inpatient services for at least 2 midnights  PT Class (Do Not Modify): Inpatient [101]  PT Acc Code (Do Not Modify): Private [1]       B Medical/Surgery History Past Medical History:  Diagnosis Date  . Anemia    "after OHS in 01/2013" (05/25/2013)  . Anginal pain ([place])   . Anxiety   . Arthritis    "joints" (05/25/2013)  . At risk for falls   . Coronary artery disease    a. nl myoview ~ 2006;  b. 01/2013 s/p MI and  CABG x 4 (VG->D2, VG->RPDA->OM3, LIMA->LAD;  c. 05/2013 Cath/PCI: LM nl, LAD 60-70p/m, D1 70p, LCX 15m, OM2 60p, RCA 50-60p/39m (3.0x33 Xience DES), VG->D2 60, VG->RPDA/OM3 100, LIMA->LAD nl w 70 dLAD, EF 50%.  . Depression   . Diabetes mellitus, type 2 ([place])    "dx'd 1992" (05/25/2013)  . Dyspnea    DOE  . Gait disturbance    uses walker some  . GERD (gastroesophageal reflux disease)   . Glaucoma    bilateral  . Headache(784.0)    "monthly" (05/25/2013)  . History of hiatal hernia   . History of pneumonia 1985  . Hyperlipidemia   . Hypertension   . Hypothyroidism   . IBS (irritable bowel syndrome)   . Lymphocytosis 03/24/2012  . Myocardial infarction ([place])    2014  . OA (osteoarthritis)   . PONV (postoperative nausea and vomiting)   . Vertigo    Past Surgical History:  Procedure Laterality Date  . BLADDER SUSPENSION  2005  . CARDIAC CATHETERIZATION  01/2013  . CARPAL TUNNEL RELEASE Bilateral 1990-1992  . CATARACT EXTRACTION W/PHACO Left 08/17/2018   Procedure: CATARACT EXTRACTION PHACO AND INTRAOCULAR LENS PLACEMENT (IOC) LEFT, DIABETIC;  Surgeon: Birder Robson, MD;  Location: [facility] ORS;  Service: Ophthalmology;  Laterality: Left;  [place] 01:01 CDE 8.51 Fluid pack lot # [id number]  . CATARACT EXTRACTION W/PHACO Right 09/14/2018   Procedure: CATARACT EXTRACTION PHACO AND INTRAOCULAR LENS PLACEMENT (  IOC) RIGHT, DIABETIC;  Surgeon: Birder Robson, MD;  Location: [facility] ORS;  Service: Ophthalmology;  Laterality: Right;  [place]  01:39 CDE 15.79 Fluid pack lot # [id number]  . COLONOSCOPY WITH PROPOFOL N/A 11/20/2015   Procedure: COLONOSCOPY WITH PROPOFOL;  Surgeon: Lucilla Lame, MD;  Location: [facility] ENDOSCOPY;  Service: Endoscopy;  Laterality: N/A;  . CORONARY ANGIOPLASTY WITH STENT PLACEMENT  05/25/2013   "1" (05/25/2013)  . CORONARY ARTERY BYPASS GRAFT N/A 01/07/2013   Procedure: CORONARY ARTERY BYPASS GRAFTING (CABG);  Surgeon: Melrose Nakayama, MD;  Location: [place];  Service: Open  Heart Surgery;  Laterality: N/A;  . ESOPHAGOGASTRODUODENOSCOPY (EGD) WITH PROPOFOL N/A 11/20/2015   Procedure: ESOPHAGOGASTRODUODENOSCOPY (EGD) WITH PROPOFOL;  Surgeon: Lucilla Lame, MD;  Location: [facility] ENDOSCOPY;  Service: Endoscopy;  Laterality: N/A;  . HAMMER TOE SURGERY Right 2009  . KNEE ARTHROSCOPY Bilateral 1993-1996  . LEFT HEART CATHETERIZATION WITH CORONARY ANGIOGRAM N/A 01/05/2013   Procedure: LEFT HEART CATHETERIZATION WITH CORONARY ANGIOGRAM;  Surgeon: Wellington Hampshire, MD;  Location: [place] CATH LAB;  Service: Cardiovascular;  Laterality: N/A;  . LEFT HEART CATHETERIZATION WITH CORONARY/GRAFT ANGIOGRAM N/A 05/25/2013   Procedure: LEFT HEART CATHETERIZATION WITH Beatrix Fetters;  Surgeon: Wellington Hampshire, MD;  Location: [place] CATH LAB;  Service: Cardiovascular;  Laterality: N/A;  . PERCUTANEOUS CORONARY STENT INTERVENTION (PCI-S)  05/25/2013   Procedure: PERCUTANEOUS CORONARY STENT INTERVENTION (PCI-S);  Surgeon: Wellington Hampshire, MD;  Location: [facility] LAB;  Service: Cardiovascular;;  . THYROIDECTOMY  2010  . VAGINAL HYSTERECTOMY  1974     A IV Location/Drains/Wounds Patient Lines/Drains/Airways Status   Active Line/Drains/Airways    Name:   Placement date:   Placement time:   Site:   Days:   Peripheral IV 01/26/19 Right Forearm   01/26/19    1407    Forearm   less than 1   Airway   08/17/18    0902     162   Incision 01/07/13 Chest Other (Comment)   01/07/13    1246     2210   Incision 01/07/13 Leg Right   01/07/13    1246     2210   Incision 01/07/13 Leg Left   01/07/13    1246     2210   Incision (Closed) 08/17/18 Eye Left   08/17/18    0902     162   Incision (Closed) 09/14/18 Eye Right   09/14/18    1050     134          Intake/Output Last 24 hours No intake or output data in the 24 hours ending 01/26/19 1542  Labs/Imaging Results for orders placed or performed during the hospital encounter of 01/26/19 (from the past 48 hour(s))  CBC with Differential      Status: Abnormal   Collection Time: 01/26/19  2:06 PM  Result Value Ref Range   WBC 10.7 (H) 4.0 - 10.5 K/uL   RBC 3.29 (L) 3.87 - 5.11 MIL/uL   Hemoglobin 10.8 (L) 12.0 - 15.0 g/dL   HCT 32.5 (L) 36.0 - 46.0 %   MCV 98.8 80.0 - 100.0 fL   MCH 32.8 26.0 - 34.0 pg   MCHC 33.2 30.0 - 36.0 g/dL   RDW 12.3 11.5 - 15.5 %   Platelets 274 150 - 400 K/uL   nRBC 0.0 0.0 - 0.2 %   Neutrophils Relative % 74 %   Neutro Abs 8.0 (H) 1.7 - 7.7 K/uL   Lymphocytes Relative 18 %  Lymphs Abs 1.9 0.7 - 4.0 K/uL   Monocytes Relative 6 %   Monocytes Absolute 0.6 0.1 - 1.0 K/uL   Eosinophils Relative 1 %   Eosinophils Absolute 0.1 0.0 - 0.5 K/uL   Basophils Relative 0 %   Basophils Absolute 0.0 0.0 - 0.1 K/uL   Immature Granulocytes 1 %   Abs Immature Granulocytes 0.05 0.00 - 0.07 K/uL    Comment: Performed at [facility], [address] [place], [place] [address]  Troponin I (High Sensitivity)     Status: Abnormal   Collection Time: 01/26/19  2:06 PM  Result Value Ref Range   Troponin I (High Sensitivity) 245 (HH) <18 ng/L    Comment: CRITICAL RESULT CALLED TO, READ BACK BY AND VERIFIED WITH Ayden Hardwick RN AT 5465 ON 01/26/2019 [facility] (NOTE) Elevated high sensitivity troponin I (hsTnI) values and significant  changes across serial measurements may suggest ACS but many other  chronic and acute conditions are known to elevate hsTnI results.  Refer to the "Links" section for chest pain algorithms and additional  guidance. Performed at [facility], [address] [place], [place] [address]   Comprehensive metabolic panel     Status: Abnormal   Collection Time: 01/26/19  2:06 PM  Result Value Ref Range   Sodium 139 135 - 145 mmol/L   Potassium 5.1 3.5 - 5.1 mmol/L   Chloride 106 98 - 111 mmol/L   CO2 24 22 - 32 mmol/L   Glucose, Bld 239 (H) 70 - 99 mg/dL   BUN 18 8 - 23 mg/dL   Creatinine, Ser 1.13 (H) 0.44 - 1.00 mg/dL   Calcium 8.9 8.9 - 10.3 mg/dL   Total Protein  7.0 6.5 - 8.1 g/dL   Albumin 4.1 3.5 - 5.0 g/dL   AST 20 15 - 41 U/L   ALT 19 0 - 44 U/L   Alkaline Phosphatase 47 38 - 126 U/L   Total Bilirubin 0.5 0.3 - 1.2 mg/dL   GFR calc non Af Amer 47 (L) >60 mL/min   GFR calc Af Amer 54 (L) >60 mL/min   Anion gap 9 5 - 15    Comment: Performed at [facility], [address] [place], [place] [address]  Lipase, blood     Status: None   Collection Time: 01/26/19  2:06 PM  Result Value Ref Range   Lipase 35 11 - 51 U/L    Comment: Performed at [facility], [address] [place], [place] [address]  Urinalysis, Complete w Microscopic     Status: Abnormal   Collection Time: 01/26/19  2:10 PM  Result Value Ref Range   Color, Urine YELLOW (A) YELLOW   APPearance CLEAR (A) CLEAR   Specific Gravity, Urine 1.013 1.005 - 1.030   pH 5.0 5.0 - 8.0   Glucose, UA 50 (A) NEGATIVE mg/dL   Hgb urine dipstick NEGATIVE NEGATIVE   Bilirubin Urine NEGATIVE NEGATIVE   Ketones, ur NEGATIVE NEGATIVE mg/dL   Protein, ur NEGATIVE NEGATIVE mg/dL   Nitrite NEGATIVE NEGATIVE   Leukocytes,Ua NEGATIVE NEGATIVE   RBC / HPF 0-5 0 - 5 RBC/hpf   WBC, UA 0-5 0 - 5 WBC/hpf   Bacteria, UA NONE SEEN NONE SEEN   Squamous Epithelial / LPF NONE SEEN 0 - 5   Mucus PRESENT    Hyaline Casts, UA PRESENT     Comment: Performed at [facility], [address], [place], [place] [address]   Dg  Chest Port 1 View  Result Date: 01/26/2019 CLINICAL DATA:  Weakness since this morning, vomiting since this morning, history coronary artery disease post MI and CABG, hypertension EXAM: PORTABLE CHEST 1 VIEW COMPARISON:  Portable exam 1427 hours compared to 08/21/2014 FINDINGS: Upper normal size of cardiac silhouette post CABG. Mediastinal contours and pulmonary vascularity normal. Atherosclerotic calcification aorta. Minimal scarring at LEFT costophrenic angle. Lungs otherwise clear. No infiltrate, pleural effusion, or pneumothorax. Mild elevation of RIGHT  diaphragm. IMPRESSION: Post CABG. No acute abnormalities. Electronically Signed   By: Lavonia Dana M.D.   On: 01/26/2019 15:07    Pending Labs Unresulted Labs (From admission, onward)    Start     Ordered   01/27/19 0500  CBC  Tomorrow morning,   STAT     01/26/19 1534   01/26/19 1535  Protime-INR  ONCE - STAT,   STAT     01/26/19 1534   01/26/19 1534  APTT  ONCE - STAT,   STAT     01/26/19 1534   01/26/19 1509  SARS Coronavirus 2 (CEPHEID - Performed in [place] hospital lab), Hosp Order  (Asymptomatic Patients Labs)  Once,   STAT    Question:  Rule Out  Answer:  Yes   01/26/19 1508   01/26/19 1406  Troponin I (High Sensitivity)  STAT Now then every 2 hours,   STAT     01/26/19 1405   01/26/19 1406  Urine culture  Once,   STAT     01/26/19 1405   Signed and Held  TSH  Once,   R     Signed and Held   Signed and Held  Hemoglobin A1c  Once,   R     Signed and Held   Signed and Held  Troponin I (High Sensitivity)  STAT Now then every 2 hours,   STAT     Signed and Held   Signed and Held  TSH  Once,   R     Signed and Held          Vitals/Pain Today's Vitals   01/26/19 1309 01/26/19 1400  BP:  125/66  Pulse: 65 64  Resp:  20  Temp: 98.4 F (36.9 C)   TempSrc: Oral   SpO2: 97% 98%  Weight: 59 kg   Height: 5\' 4"  (1.626 m)   PainSc: 0-No pain     Isolation Precautions No active isolations  Medications Medications  sodium chloride 0.9 % bolus 500 mL (500 mLs Intravenous New Bag/Given 01/26/19 1438)    Mobility walks with person assist High fall risk   Focused Assessments Cardiac Assessment Handoff:    Lab Results  Component Value Date   TROPONINI < 0.02 08/21/2014   No results found for: DDIMER Does the Patient currently have chest pain? No     R Recommendations: See Admitting Provider Note  Report given to:   Additional Notes:  Pt has tolerated diet gingerale

## 2019-01-26 NOTE — ED Notes (Signed)
MD made aware of critical troponin, repeat EKG given to MD

## 2019-01-26 NOTE — ED Notes (Signed)
Cardiologist and admitting MD notified of increase in troponin

## 2019-01-26 NOTE — ED Triage Notes (Signed)
PT to ED via EMS from home. PT c/o weakness that started this morning, also vomiting this morning. PT stats she thought her sugar was low, so she drank orange juice and threw that up. She checked sugar after and it was 200's. PT ao at this time. States not nauseated.

## 2019-01-26 NOTE — Progress Notes (Signed)
Family Meeting Note  Advance Directive:yes  Today a meeting took place with the Patient.  Patient is able to participate  The following clinical team members were present during this meeting:MD  The following were discussed:Patient's diagnosis:77 y.o. female with a known history per below which includes diabetes, coronary artery disease status post MI, hypertension, hypothyroidism, presenting to the emergency room with generalized weakness, fatigue, lightheadedness, associated with multiple episodes of emesis, in the emergency room patient was found to have troponin greater than 240, EKG noted for normal sinus rhythm/no signs of acute ischemia, glucose 239, hospitalist asked to admit, currently feeling better, no complaints, denies chest pain/shortness of breath, case discussed with cardiology/Dr. Kallie Locks will admit for possible non-STEMI, elevated troponins, heparin drip for now, Patient's progosis: Unable to determine and Goals for treatment: Full Code  Additional follow-up to be provided: prn  Time spent during discussion:20 minutes  Gorden Harms, MD

## 2019-01-26 NOTE — Progress Notes (Signed)
Pt arrived from the ED via stretcher at 1800. Pt was A&Ox4. VSS. Pt oriented to room and bedside equipment. Pt instructed to not get OOB without calling for help. Pt verbalized understanding. Fall risk bracelet and allergy bracelets applied. Pt arrived with clothes, cell phone, and charger. Heparin gtt going at 42mL/hr. Pts skin is intact and this was verified by Linard Millers, RN. Orders have been reviewed, acknowledge, and initiated. Care plan and education initiated.

## 2019-01-27 ENCOUNTER — Inpatient Hospital Stay (HOSPITAL_COMMUNITY)
Admit: 2019-01-27 | Discharge: 2019-01-27 | Disposition: A | Discharge status: Home / Self Care | Payer: Medicare Other | Attending: Family Medicine | Admitting: Family Medicine

## 2019-01-27 DIAGNOSIS — I361 Nonrheumatic tricuspid (valve) insufficiency: Secondary | ICD-10-CM

## 2019-01-27 DIAGNOSIS — I214 Non-ST elevation (NSTEMI) myocardial infarction: Principal | ICD-10-CM

## 2019-01-27 LAB — HEPARIN LEVEL (UNFRACTIONATED)
Heparin Unfractionated: 0.18 IU/mL — ABNORMAL LOW (ref 0.30–0.70)
Heparin Unfractionated: 0.89 IU/mL — ABNORMAL HIGH (ref 0.30–0.70)
Heparin Unfractionated: 0.56 IU/mL (ref 0.30–0.70)

## 2019-01-27 LAB — URINE CULTURE

## 2019-01-27 LAB — CBC
HCT: 29.9 % — ABNORMAL LOW (ref 36.0–46.0)
Hemoglobin: 10 g/dL — ABNORMAL LOW (ref 12.0–15.0)
MCH: 32.9 pg (ref 26.0–34.0)
MCHC: 33.4 g/dL (ref 30.0–36.0)
MCV: 98.4 fL (ref 80.0–100.0)
Platelets: 244 10*3/uL (ref 150–400)
RBC: 3.04 MIL/uL — ABNORMAL LOW (ref 3.87–5.11)
RDW: 12.2 % (ref 11.5–15.5)
WBC: 7.3 10*3/uL (ref 4.0–10.5)
nRBC: 0 % (ref 0.0–0.2)

## 2019-01-27 LAB — GLUCOSE, CAPILLARY
Glucose-Capillary: 111 mg/dL — ABNORMAL HIGH (ref 70–99)
Glucose-Capillary: 195 mg/dL — ABNORMAL HIGH (ref 70–99)
Glucose-Capillary: 89 mg/dL (ref 70–99)
Glucose-Capillary: 156 mg/dL — ABNORMAL HIGH (ref 70–99)

## 2019-01-27 LAB — ECHOCARDIOGRAM COMPLETE
Height: 64 in
Weight: 2152 oz

## 2019-01-27 MED ORDER — HEPARIN BOLUS VIA INFUSION
1500.0000 [IU] | Freq: Once | INTRAVENOUS | Status: AC
  Administered 2019-01-27: 1500 [IU] via INTRAVENOUS
  Filled 2019-01-27: qty 1500

## 2019-01-27 NOTE — Progress Notes (Signed)
[place] at [place] NAME: Anivea Velasques    MR#:  [id number]  DATE OF BIRTH:  July 14, 1941  SUBJECTIVE:  him in with chest pain dizziness and feeling shaky. Denies any chest pain having a better day today. She is on IV heparin drip. Plan for cardiac cath tomorrow.  REVIEW OF SYSTEMS:   Review of Systems  Constitutional: Negative for chills, fever and weight loss.  HENT: Negative for ear discharge, ear pain and nosebleeds.   Eyes: Negative for blurred vision, pain and discharge.  Respiratory: Negative for sputum production, shortness of breath, wheezing and stridor.   Cardiovascular: Negative for chest pain, palpitations, orthopnea and PND.  Gastrointestinal: Negative for abdominal pain, diarrhea, nausea and vomiting.  Genitourinary: Negative for frequency and urgency.  Musculoskeletal: Negative for back pain and joint pain.  Neurological: Positive for weakness. Negative for sensory change, speech change and focal weakness.  Psychiatric/Behavioral: Negative for depression and hallucinations. The patient is not nervous/anxious.    Tolerating Diet: yes Tolerating PT:   DRUG ALLERGIES:   Allergies  Allergen Reactions  . Amitriptyline Other (See Comments)    Disorientation   . Cortisone Other (See Comments)    Makes eyes hurt and sugar goes up  . Lipitor [Atorvastatin]     Fatigue and weakness and high and low doses   . Macrodantin [Nitrofurantoin] Itching and Swelling  . Motrin [Ibuprofen] Other (See Comments)    Causes fluid retention    VITALS:  Blood pressure 113/66, pulse (!) 58, temperature (!) 97.5 F (36.4 C), temperature source Oral, resp. rate 16, height 5\' 4"  (1.626 m), weight 61 kg, SpO2 98 %.  PHYSICAL EXAMINATION:   Physical Exam  GENERAL:  78 y.o.-year-old patient lying in the bed with no acute distress.  EYES: Pupils equal, round, reactive to light and accommodation. No scleral icterus. Extraocular muscles  intact.  HEENT: Head atraumatic, normocephalic. Oropharynx and nasopharynx clear.  NECK:  Supple, no jugular venous distention. No thyroid enlargement, no tenderness.  LUNGS: Normal breath sounds bilaterally, no wheezing, rales, rhonchi. No use of accessory muscles of respiration.  CARDIOVASCULAR: S1, S2 normal. No murmurs, rubs, or gallops.  ABDOMEN: Soft, nontender, nondistended. Bowel sounds present. No organomegaly or mass.  EXTREMITIES: No cyanosis, clubbing or edema b/l.    NEUROLOGIC: Cranial nerves II through XII are intact. No focal Motor or sensory deficits b/l.   PSYCHIATRIC:  patient is alert and oriented x 3.  SKIN: No obvious rash, lesion, or ulcer.   LABORATORY PANEL:  CBC Recent Labs  Lab 01/27/19 0103  WBC 7.3  HGB 10.0*  HCT 29.9*  PLT 244    Chemistries  Recent Labs  Lab 01/26/19 1406  NA 139  K 5.1  CL 106  CO2 24  GLUCOSE 239*  BUN 18  CREATININE 1.13*  CALCIUM 8.9  AST 20  ALT 19  ALKPHOS 47  BILITOT 0.5   Cardiac Enzymes No results for input(s): TROPONINI in the last 168 hours. RADIOLOGY:  Dg Chest Port 1 View  Result Date: 01/26/2019 CLINICAL DATA:  Weakness since this morning, vomiting since this morning, history coronary artery disease post MI and CABG, hypertension EXAM: PORTABLE CHEST 1 VIEW COMPARISON:  Portable exam 1427 hours compared to 08/21/2014 FINDINGS: Upper normal size of cardiac silhouette post CABG. Mediastinal contours and pulmonary vascularity normal. Atherosclerotic calcification aorta. Minimal scarring at LEFT costophrenic angle. Lungs otherwise clear. No infiltrate, pleural effusion, or pneumothorax. Mild elevation of RIGHT diaphragm.  IMPRESSION: Post CABG. No acute abnormalities. Electronically Signed   By: Lavonia Dana M.D.   On: 01/26/2019 15:07   ASSESSMENT AND PLAN:   Daelyn Pettaway  is a 78 y.o. female with a known history per below which includes diabetes, coronary artery disease status post MI, hypertension,  hypothyroidism, presenting to the emergency room with generalized weakness, fatigue, lightheadedness, associated with multiple episodes of emesis, in the emergency room patient was found to have troponin greater than 240.  *Elevated high-sensitivity troponin in a patient with history of CAD status post PCI and CABG consistent with non-QWMI -patient on IV heparin drip -cardiac cath planned for tomorrow -appreciate cardiology input by Dr. Harrell Gave -continue aspirin, Coreg, Ranexa, Crestor  *Dizziness/shakiness. No further episodes. -She has known history of vertigo. -I recommended her outpatient ENT evaluation  *Hypertension continue blood pressure meds  *Hyperlipidemia on Crestor   CODE STATUS: full  DVT Prophylaxis: heparin drip  TOTAL TIME TAKING CARE OF THIS PATIENT: *30 minutes.  >50% time spent on counselling and coordination of care  POSSIBLE D/C IN *1-2* DAYS, DEPENDING ON CLINICAL CONDITION.  Note: This dictation was prepared with Dragon dictation along with smaller phrase technology. Any transcriptional errors that result from this process are unintentional.  Fritzi Mandes M.D on 01/27/2019 at 3:42 PM  Between 7am to 6pm - Pager - [phone]  After 6pm go to [email or web address] - password [email or web address] [facility]  Office  [phone]  CC: Primary care physician; Burnard Bunting, MDPatient ID: [name], female   DOB: 1940/09/24, 78 y.o.   MRN: [id number]

## 2019-01-27 NOTE — Consult Note (Signed)
[place] for Heparin dosing  Indication: ACS / STEMI  Allergies  Allergen Reactions  . Amitriptyline Other (See Comments)    Disorientation   . Cortisone Other (See Comments)    Makes eyes hurt and sugar goes up  . Lipitor [Atorvastatin]     Fatigue and weakness and high and low doses   . Macrodantin [Nitrofurantoin] Itching and Swelling  . Motrin [Ibuprofen] Other (See Comments)    Causes fluid retention    Patient Measurements: Height: 5\' 4"  (162.6 cm) Weight: 136 lb (61.7 kg) IBW/kg (Calculated) : 54.7 Heparin Dosing Weight: 59 kg   Vital Signs: Temp: 97.4 F (36.3 C) (06/24 1956) Temp Source: Oral (06/24 1956) BP: 146/75 (06/24 1956) Pulse Rate: 71 (06/24 1956)  Labs: Recent Labs    01/26/19 1406 01/26/19 1534 01/27/19 0103  HGB 10.8*  --  10.0*  HCT 32.5*  --  29.9*  PLT 274  --  244  APTT  --  30  --   LABPROT  --  13.5  --   INR  --  1.0  --   HEPARINUNFRC  --   --  0.18*  CREATININE 1.13*  --   --    Troponin 245 >>  Estimated Creatinine Clearance: 36 mL/min (A) (by C-G formula based on SCr of 1.13 mg/dL (H)).   Medications:  No PTA anticoagulants - confirmed with patient.   Assessment: Ms. Nieblas is 31 YOF who presented with emesis this morning that was unresolved. Pharmacy was consulted for ACS/STEMI. Patient's troponin level is elevated and EKG is pending at this time. Heparin DW:  59 kg   6/24 heparin infusion started @ 700 units/hr 6/24 @ 0100 HL 0.18. Level is subtherapeutic.   Goal of Therapy:  Heparin level 0.3-0.7 units/ml Monitor platelets by anticoagulation protocol: Yes   Plan:  6/24 @ 0100 HL 0.18. Level is subtherapeutic.  Will order heparin 1500 unit bolus Increase heparin infusion to 850 units/hr Check anti-Xa level in 8 hours and daily while on heparin, per protocol Continue to monitor H&H and platelets  Pernell Dupre, PharmD, BCPS Clinical Pharmacist 01/27/2019 1:52 AM

## 2019-01-27 NOTE — Progress Notes (Signed)
Progress Note  Patient Name: Angela Barrett Date of Encounter: 01/27/2019  Primary Cardiologist: Fletcher Anon  Subjective   No chest pain, SOB, dizziness, jitteriness, or palpitations. Feels back to baseline this morning.   Hs-Tn has trended to 836 as of last evening. She remains on heparin gtt. Echo is pending. BP improved.   Inpatient Medications    Scheduled Meds: . aspirin EC  81 mg Oral Daily  . carvedilol  3.125 mg Oral BID WC  . insulin aspart  0-20 Units Subcutaneous TID WC  . insulin aspart  0-5 Units Subcutaneous QHS  . insulin detemir  20 Units Subcutaneous QHS  . latanoprost  1 drop Both Eyes QHS  . levothyroxine  88 mcg Oral Q0600  . lisinopril  5 mg Oral Q breakfast  . LORazepam  2.5 mg Oral QHS  . pantoprazole  40 mg Oral QAC breakfast  . ranolazine  1,000 mg Oral BID  . rosuvastatin  5 mg Oral Q breakfast  . sertraline  25 mg Oral Daily  . sodium chloride flush  3 mL Intravenous Q12H   Continuous Infusions: . sodium chloride    . heparin 850 Units/hr (01/27/19 0159)   PRN Meds: sodium chloride, acetaminophen, ALPRAZolam, meclizine, morphine injection, nitroGLYCERIN, ondansetron (ZOFRAN) IV, ondansetron, polyvinyl alcohol, sodium chloride flush   Vital Signs    Vitals:   01/26/19 1838 01/26/19 1956 01/27/19 0427 01/27/19 0744  BP:  (!) 146/75 (!) 100/59 113/66  Pulse:  71 63 (!) 58  Resp:  16 16   Temp:  (!) 97.4 F (36.3 C) 98.4 F (36.9 C) (!) 97.5 F (36.4 C)  TempSrc:  Oral Oral Oral  SpO2:  98% 95% 98%  Weight: 61.7 kg  61 kg   Height: 5\' 4"  (1.626 m)       Intake/Output Summary (Last 24 hours) at 01/27/2019 0839 Last data filed at 01/27/2019 0300 Gross per 24 hour  Intake 608.9 ml  Output 0 ml  Net 608.9 ml   Filed Weights   01/26/19 1309 01/26/19 1838 01/27/19 0427  Weight: 59 kg 61.7 kg 61 kg    Telemetry    SR - Personally Reviewed  ECG    n/a - Personally Reviewed  Physical Exam   GEN: No acute distress.   Neck: No JVD.  Cardiac: RRR, no murmurs, rubs, or gallops.  Respiratory: Clear to auscultation bilaterally.  GI: Soft, nontender, non-distended.   MS: No edema; No deformity. Neuro:  Alert and oriented x 3; Nonfocal.  Psych: Normal affect.  Labs    Chemistry Recent Labs  Lab 01/26/19 1406  NA 139  K 5.1  CL 106  CO2 24  GLUCOSE 239*  BUN 18  CREATININE 1.13*  CALCIUM 8.9  PROT 7.0  ALBUMIN 4.1  AST 20  ALT 19  ALKPHOS 47  BILITOT 0.5  GFRNONAA 47*  GFRAA 54*  ANIONGAP 9     Hematology Recent Labs  Lab 01/26/19 1406 01/27/19 0103  WBC 10.7* 7.3  RBC 3.29* 3.04*  HGB 10.8* 10.0*  HCT 32.5* 29.9*  MCV 98.8 98.4  MCH 32.8 32.9  MCHC 33.2 33.4  RDW 12.3 12.2  PLT 274 244    Cardiac EnzymesNo results for input(s): TROPONINI in the last 168 hours. No results for input(s): TROPIPOC in the last 168 hours.   BNPNo results for input(s): BNP, PROBNP in the last 168 hours.   DDimer No results for input(s): DDIMER in the last 168 hours.  Radiology    Dg Chest Port 1 View  Result Date: 01/26/2019 IMPRESSION: Post CABG. No acute abnormalities. Electronically Signed   By: Lavonia Dana M.D.   On: 01/26/2019 15:07    Cardiac Studies   2D Echo pending  Patient Profile     78 y.o. female with history of CAD status post four-vessel CABG in 01/2013 status post PCI to the native proximal and mid RCA in 05/2013, diabetes, hypertension, hyperlipidemia, hypothyroidism, IBS, anxiety, dizziness/gait disturbance, vertigo depression, and GERD who is being seen today for the evaluation of elevated troponin at the request of Dr. Jerelyn Charles.  Assessment & Plan    1. Elevated high-sensitivity troponin/CAD status post CABG status post PCI: -Elevated high-sensitivity troponin has trended to 836 -Never with chest pain or SOB -Check echo today -Plan for LHC on 6/26, given lack of angina and currently back to baseline -Continue heparin gtt -Cannot exclude run of SVT as this was previously noted on  Zio playing a role in her jitteriness/dizziness as well as her elevated troponin -ASA, Coreg, Ranexa, Crestor -NPO at midnight  -Risks and benefits of cardiac catheterization have been discussed with the patient including risks of bleeding, bruising, infection, kidney damage, stroke, heart attack, and death. The patient understands these risks and is willing to proceed with the procedure. All questions have been answered and concerns listened to  2.  Dizziness/reported syncope: -No further issues -Patient had a reported syncopal episode while wearing cardiac monitoring in 10/2018 which did not show any significant arrhythmia or pauses.  Patient was noted to be in sinus rhythm with a heart rate of 60 bpm at the time of the reported syncopal event -She has known history of vertigo and has reported PT to be somewhat beneficial -Tele unrevealing to date  3. Paroxysmal SVT: -No ectopy noted on tele, continue to monitor  -Possibly playing a role in the above -Consider repeating outpatient cardiac monitoring   4.  Nausea/vomiting/diarrhea: -Per IM  5.  Hypertension: -Blood pressure reasonably controlled -Continue prior to admission medications  6.  Hyperlipidemia: -Tolerating low-dose Crestor with prior intolerance to statins  For questions or updates, please contact [place] Please consult [email or web address] for contact info under Cardiology/STEMI.    Signed, Christell Faith, PA-C [place] Pager: [phone] 01/27/2019, 8:39 AM

## 2019-01-27 NOTE — Progress Notes (Signed)
ANTICOAGULATION CONSULT NOTE - Initial Consult  Pharmacy Consult for Heparin  Indication: chest pain/ACS  Allergies  Allergen Reactions  . Amitriptyline Other (See Comments)    Disorientation   . Cortisone Other (See Comments)    Makes eyes hurt and sugar goes up  . Lipitor [Atorvastatin]     Fatigue and weakness and high and low doses   . Macrodantin [Nitrofurantoin] Itching and Swelling  . Motrin [Ibuprofen] Other (See Comments)    Causes fluid retention    Patient Measurements: Height: 5\' 4"  (162.6 cm) Weight: 134 lb 8 oz (61 kg) IBW/kg (Calculated) : 54.7 Heparin Dosing Weight: 59 kg   Vital Signs: Temp: 98.3 F (36.8 C) (06/25 1919) Temp Source: Oral (06/25 1919) BP: 132/70 (06/25 1919) Pulse Rate: 62 (06/25 1919)  Labs: Recent Labs    01/26/19 1406 01/26/19 1534 01/27/19 0103 01/27/19 0955 01/27/19 2023  HGB 10.8*  --  10.0*  --   --   HCT 32.5*  --  29.9*  --   --   PLT 274  --  244  --   --   APTT  --  30  --   --   --   LABPROT  --  13.5  --   --   --   INR  --  1.0  --   --   --   HEPARINUNFRC  --   --  0.18* 0.89* 0.56  CREATININE 1.13*  --   --   --   --     Estimated Creatinine Clearance: 36 mL/min (A) (by C-G formula based on SCr of 1.13 mg/dL (H)).   Medical History: Past Medical History:  Diagnosis Date  . Anemia    "after OHS in 01/2013" (05/25/2013)  . Anginal pain ([place])   . Anxiety   . Arthritis    "joints" (05/25/2013)  . At risk for falls   . Coronary artery disease    a. nl myoview ~ 2006;  b. 01/2013 s/p MI and CABG x 4 (VG->D2, VG->RPDA->OM3, LIMA->LAD;  c. 05/2013 Cath/PCI: LM nl, LAD 60-70p/m, D1 70p, LCX 179m, OM2 60p, RCA 50-60p/53m (3.0x33 Xience DES), VG->D2 60, VG->RPDA/OM3 100, LIMA->LAD nl w 70 dLAD, EF 50%.  . Depression   . Diabetes mellitus, type 2 ([place])    "dx'd 1992" (05/25/2013)  . Dyspnea    DOE  . Gait disturbance    uses walker some  . GERD (gastroesophageal reflux disease)   . Glaucoma    bilateral  .  Headache(784.0)    "monthly" (05/25/2013)  . History of hiatal hernia   . History of pneumonia 1985  . Hyperlipidemia   . Hypertension   . Hypothyroidism   . IBS (irritable bowel syndrome)   . Lymphocytosis 03/24/2012  . Myocardial infarction ([place])    2014  . OA (osteoarthritis)   . PONV (postoperative nausea and vomiting)   . Vertigo     Medications:  No PTA anticoagulants - confirmed with patient.   Assessment: Angela Barrett is 35 YOF who presented with emesis this morning that was unresolved. Pharmacy was consulted for ACS/STEMI. Patient's troponin level is elevated and EKG is pending at this time. Heparin DW:  59 kg   6/24 heparin infusion started @ 700 units/hr 6/24 @ 0100 HL 0.18. Level is subtherapeutic. 1500 unit bolus Increase heparin infusion to 850 units/hr 6/25 @ 0955 HL 0.89 Level is supratherapeutic   Goal of Therapy:  Heparin level 0.3-0.7 units/ml Monitor platelets by anticoagulation  protocol: Yes   Plan:  Heparin level is supratherapeutic.  Will decrease heparin infusion to 750 units/hr Check anti-Xa level in8 hours and daily while on heparin, per protocol Continue to monitor H&H and platelets  6/25 :  HL @ 2023 = 0.56  Will continue pt on current rate and draw confirmation level on 6/26 @ 0400.   Angela Barrett D 01/27/2019,9:18 PM

## 2019-01-27 NOTE — H&P (View-Only) (Signed)
Progress Note  Patient Name: Angela Barrett Date of Encounter: 01/27/2019  Primary Cardiologist: Fletcher Anon  Subjective   No chest pain, SOB, dizziness, jitteriness, or palpitations. Feels back to baseline this morning.   Hs-Tn has trended to 836 as of last evening. She remains on heparin gtt. Echo is pending. BP improved.   Inpatient Medications    Scheduled Meds: . aspirin EC  81 mg Oral Daily  . carvedilol  3.125 mg Oral BID WC  . insulin aspart  0-20 Units Subcutaneous TID WC  . insulin aspart  0-5 Units Subcutaneous QHS  . insulin detemir  20 Units Subcutaneous QHS  . latanoprost  1 drop Both Eyes QHS  . levothyroxine  88 mcg Oral Q0600  . lisinopril  5 mg Oral Q breakfast  . LORazepam  2.5 mg Oral QHS  . pantoprazole  40 mg Oral QAC breakfast  . ranolazine  1,000 mg Oral BID  . rosuvastatin  5 mg Oral Q breakfast  . sertraline  25 mg Oral Daily  . sodium chloride flush  3 mL Intravenous Q12H   Continuous Infusions: . sodium chloride    . heparin 850 Units/hr (01/27/19 0159)   PRN Meds: sodium chloride, acetaminophen, ALPRAZolam, meclizine, morphine injection, nitroGLYCERIN, ondansetron (ZOFRAN) IV, ondansetron, polyvinyl alcohol, sodium chloride flush   Vital Signs    Vitals:   01/26/19 1838 01/26/19 1956 01/27/19 0427 01/27/19 0744  BP:  (!) 146/75 (!) 100/59 113/66  Pulse:  71 63 (!) 58  Resp:  16 16   Temp:  (!) 97.4 F (36.3 C) 98.4 F (36.9 C) (!) 97.5 F (36.4 C)  TempSrc:  Oral Oral Oral  SpO2:  98% 95% 98%  Weight: 61.7 kg  61 kg   Height: 5\' 4"  (1.626 m)       Intake/Output Summary (Last 24 hours) at 01/27/2019 0839 Last data filed at 01/27/2019 0300 Gross per 24 hour  Intake 608.9 ml  Output 0 ml  Net 608.9 ml   Filed Weights   01/26/19 1309 01/26/19 1838 01/27/19 0427  Weight: 59 kg 61.7 kg 61 kg    Telemetry    SR - Personally Reviewed  ECG    n/a - Personally Reviewed  Physical Exam   GEN: No acute distress.   Neck: No JVD.  Cardiac: RRR, no murmurs, rubs, or gallops.  Respiratory: Clear to auscultation bilaterally.  GI: Soft, nontender, non-distended.   MS: No edema; No deformity. Neuro:  Alert and oriented x 3; Nonfocal.  Psych: Normal affect.  Labs    Chemistry Recent Labs  Lab 01/26/19 1406  NA 139  K 5.1  CL 106  CO2 24  GLUCOSE 239*  BUN 18  CREATININE 1.13*  CALCIUM 8.9  PROT 7.0  ALBUMIN 4.1  AST 20  ALT 19  ALKPHOS 47  BILITOT 0.5  GFRNONAA 47*  GFRAA 54*  ANIONGAP 9     Hematology Recent Labs  Lab 01/26/19 1406 01/27/19 0103  WBC 10.7* 7.3  RBC 3.29* 3.04*  HGB 10.8* 10.0*  HCT 32.5* 29.9*  MCV 98.8 98.4  MCH 32.8 32.9  MCHC 33.2 33.4  RDW 12.3 12.2  PLT 274 244    Cardiac EnzymesNo results for input(s): TROPONINI in the last 168 hours. No results for input(s): TROPIPOC in the last 168 hours.   BNPNo results for input(s): BNP, PROBNP in the last 168 hours.   DDimer No results for input(s): DDIMER in the last 168 hours.  Radiology    Dg Chest Port 1 View  Result Date: 01/26/2019 IMPRESSION: Post CABG. No acute abnormalities. Electronically Signed   By: Lavonia Dana M.D.   On: 01/26/2019 15:07    Cardiac Studies   2D Echo pending  Patient Profile     78 y.o. female with history of CAD status post four-vessel CABG in 01/2013 status post PCI to the native proximal and mid RCA in 05/2013, diabetes, hypertension, hyperlipidemia, hypothyroidism, IBS, anxiety, dizziness/gait disturbance, vertigo depression, and GERD who is being seen today for the evaluation of elevated troponin at the request of Dr. Jerelyn Charles.  Assessment & Plan    1. Elevated high-sensitivity troponin/CAD status post CABG status post PCI: -Elevated high-sensitivity troponin has trended to 836 -Never with chest pain or SOB -Check echo today -Plan for LHC on 6/26, given lack of angina and currently back to baseline -Continue heparin gtt -Cannot exclude run of SVT as this was previously noted on  Zio playing a role in her jitteriness/dizziness as well as her elevated troponin -ASA, Coreg, Ranexa, Crestor -NPO at midnight  -Risks and benefits of cardiac catheterization have been discussed with the patient including risks of bleeding, bruising, infection, kidney damage, stroke, heart attack, and death. The patient understands these risks and is willing to proceed with the procedure. All questions have been answered and concerns listened to  2.  Dizziness/reported syncope: -No further issues -Patient had a reported syncopal episode while wearing cardiac monitoring in 10/2018 which did not show any significant arrhythmia or pauses.  Patient was noted to be in sinus rhythm with a heart rate of 60 bpm at the time of the reported syncopal event -She has known history of vertigo and has reported PT to be somewhat beneficial -Tele unrevealing to date  3. Paroxysmal SVT: -No ectopy noted on tele, continue to monitor  -Possibly playing a role in the above -Consider repeating outpatient cardiac monitoring   4.  Nausea/vomiting/diarrhea: -Per IM  5.  Hypertension: -Blood pressure reasonably controlled -Continue prior to admission medications  6.  Hyperlipidemia: -Tolerating low-dose Crestor with prior intolerance to statins  For questions or updates, please contact [place] Please consult [email or web address] for contact info under Cardiology/STEMI.    Signed, Christell Faith, PA-C [place] Pager: [phone] 01/27/2019, 8:39 AM

## 2019-01-27 NOTE — Consult Note (Signed)
Angela Barrett for Heparin dosing  Indication: ACS / STEMI  Allergies  Allergen Reactions  . Amitriptyline Other (See Comments)    Disorientation   . Cortisone Other (See Comments)    Makes eyes hurt and sugar goes up  . Lipitor [Atorvastatin]     Fatigue and weakness and high and low doses   . Macrodantin [Nitrofurantoin] Itching and Swelling  . Motrin [Ibuprofen] Other (See Comments)    Causes fluid retention    Patient Measurements: Height: 5\' 4"  (162.6 cm) Weight: 134 lb 8 oz (61 kg) IBW/kg (Calculated) : 54.7 Heparin Dosing Weight: 59 kg   Vital Signs: Temp: 97.5 F (36.4 C) (06/25 0744) Temp Source: Oral (06/25 0744) BP: 113/66 (06/25 0744) Pulse Rate: 58 (06/25 0744)  Labs: Recent Labs    01/26/19 1406 01/26/19 1534 01/27/19 0103 01/27/19 0955  HGB 10.8*  --  10.0*  --   HCT 32.5*  --  29.9*  --   PLT 274  --  244  --   APTT  --  30  --   --   LABPROT  --  13.5  --   --   INR  --  1.0  --   --   HEPARINUNFRC  --   --  0.18* 0.89*  CREATININE 1.13*  --   --   --    Troponin 245 >>  Estimated Creatinine Clearance: 36 mL/min (A) (by C-G formula based on SCr of 1.13 mg/dL (H)).   Medications:  No PTA anticoagulants - confirmed with patient.   Assessment: Angela Barrett is 41 YOF who presented with emesis this morning that was unresolved. Pharmacy was consulted for ACS/STEMI. Patient's troponin level is elevated and EKG is pending at this time. Heparin DW:  59 kg   6/24 heparin infusion started @ 700 units/hr 6/24 @ 0100 HL 0.18. Level is subtherapeutic. 1500 unit bolus Increase heparin infusion to 850 units/hr 6/25 @ 0955 HL 0.89 Level is supratherapeutic   Goal of Therapy:  Heparin level 0.3-0.7 units/ml Monitor platelets by anticoagulation protocol: Yes   Plan:  Heparin level is supratherapeutic.  Will decrease heparin infusion to 750 units/hr Check anti-Xa level in 8 hours and daily while on heparin, per  protocol Continue to monitor H&H and platelets  Oswald Hillock, PharmD, BCPS Clinical Pharmacist 01/27/2019 11:07 AM

## 2019-01-27 NOTE — Plan of Care (Signed)
Pt alert and oriented x 4. Compliant with medications. Denied any pain or discomfort when asked. Pt educated about being NPO after midnight and voiced understanding. Heparin gtt infusing per order. VSS. Will continue to monitor.   Problem: Education: Goal: Knowledge of General Education information will improve Description: Including pain rating scale, medication(s)/side effects and non-pharmacologic comfort measures Outcome: Progressing   Problem: Clinical Measurements: Goal: Ability to maintain clinical measurements within normal limits will improve Outcome: Progressing Goal: Will remain free from infection Outcome: Progressing Goal: Cardiovascular complication will be avoided Outcome: Progressing   Problem: Coping: Goal: Level of anxiety will decrease Outcome: Progressing   Problem: Elimination: Goal: Will not experience complications related to urinary retention Outcome: Progressing   Problem: Pain Managment: Goal: General experience of comfort will improve Outcome: Progressing   Problem: Skin Integrity: Goal: Risk for impaired skin integrity will decrease Outcome: Progressing

## 2019-01-27 NOTE — Progress Notes (Signed)
*  PRELIMINARY RESULTS* Echocardiogram 2D Echocardiogram has been performed.  Sherrie Sport 01/27/2019, 1:35 PM

## 2019-01-28 ENCOUNTER — Encounter
Admission: EM | Disposition: A | Discharge status: Home Health | Payer: Self-pay | Source: Home / Self Care | Attending: Internal Medicine

## 2019-01-28 DIAGNOSIS — I471 Supraventricular tachycardia: Secondary | ICD-10-CM

## 2019-01-28 DIAGNOSIS — I429 Cardiomyopathy, unspecified: Secondary | ICD-10-CM

## 2019-01-28 DIAGNOSIS — I25110 Atherosclerotic heart disease of native coronary artery with unstable angina pectoris: Secondary | ICD-10-CM

## 2019-01-28 DIAGNOSIS — E785 Hyperlipidemia, unspecified: Secondary | ICD-10-CM

## 2019-01-28 HISTORY — PX: LEFT HEART CATH AND CORS/GRAFTS ANGIOGRAPHY: CATH118250

## 2019-01-28 LAB — CBC
HCT: 33 % — ABNORMAL LOW (ref 36.0–46.0)
Hemoglobin: 11.2 g/dL — ABNORMAL LOW (ref 12.0–15.0)
MCH: 32.6 pg (ref 26.0–34.0)
MCHC: 33.9 g/dL (ref 30.0–36.0)
MCV: 95.9 fL (ref 80.0–100.0)
Platelets: 299 10*3/uL (ref 150–400)
RBC: 3.44 MIL/uL — ABNORMAL LOW (ref 3.87–5.11)
RDW: 12.3 % (ref 11.5–15.5)
WBC: 8.3 10*3/uL (ref 4.0–10.5)
nRBC: 0 % (ref 0.0–0.2)

## 2019-01-28 LAB — BASIC METABOLIC PANEL
Anion gap: 11 (ref 5–15)
BUN: 21 mg/dL (ref 8–23)
CO2: 23 mmol/L (ref 22–32)
Calcium: 9.2 mg/dL (ref 8.9–10.3)
Chloride: 105 mmol/L (ref 98–111)
Creatinine, Ser: 1.33 mg/dL — ABNORMAL HIGH (ref 0.44–1.00)
GFR calc Af Amer: 45 mL/min — ABNORMAL LOW (ref >60)
GFR calc non Af Amer: 38 mL/min — ABNORMAL LOW (ref >60)
Glucose, Bld: 104 mg/dL — ABNORMAL HIGH (ref 70–99)
Potassium: 4 mmol/L (ref 3.5–5.1)
Sodium: 139 mmol/L (ref 135–145)

## 2019-01-28 LAB — GLUCOSE, CAPILLARY
Glucose-Capillary: 93 mg/dL (ref 70–99)
Glucose-Capillary: 121 mg/dL — ABNORMAL HIGH (ref 70–99)
Glucose-Capillary: 186 mg/dL — ABNORMAL HIGH (ref 70–99)

## 2019-01-28 LAB — HEPARIN LEVEL (UNFRACTIONATED): Heparin Unfractionated: 0.62 IU/mL (ref 0.30–0.70)

## 2019-01-28 SURGERY — LEFT HEART CATH AND CORS/GRAFTS ANGIOGRAPHY
Anesthesia: Moderate Sedation

## 2019-01-28 MED ORDER — SODIUM CHLORIDE 0.9% FLUSH
3.0000 mL | Freq: Two times a day (BID) | INTRAVENOUS | Status: DC
  Administered 2019-01-28 – 2019-01-29 (×2): 3 mL via INTRAVENOUS

## 2019-01-28 MED ORDER — SODIUM CHLORIDE 0.9 % IV SOLN: INTRAVENOUS | Status: AC

## 2019-01-28 MED ORDER — SODIUM CHLORIDE 0.9% FLUSH
3.0000 mL | Freq: Two times a day (BID) | INTRAVENOUS | Status: DC
  Administered 2019-01-28: 3 mL via INTRAVENOUS

## 2019-01-28 MED ORDER — HYDRALAZINE HCL 20 MG/ML IJ SOLN: 10.0000 mg | INTRAMUSCULAR | Status: AC | PRN

## 2019-01-28 MED ORDER — CLOPIDOGREL BISULFATE 75 MG PO TABS
300.0000 mg | ORAL_TABLET | Freq: Once | ORAL | Status: AC
  Administered 2019-01-28: 12:00:00 300 mg via ORAL

## 2019-01-28 MED ORDER — FENTANYL CITRATE (PF) 100 MCG/2ML IJ SOLN
INTRAMUSCULAR | Status: AC
  Filled 2019-01-28: qty 2

## 2019-01-28 MED ORDER — SODIUM CHLORIDE 0.9% FLUSH: 3.0000 mL | INTRAVENOUS | Status: DC | PRN

## 2019-01-28 MED ORDER — MIDAZOLAM HCL 2 MG/2ML IJ SOLN
INTRAMUSCULAR | Status: DC | PRN
  Administered 2019-01-28: 1 mg via INTRAVENOUS

## 2019-01-28 MED ORDER — IOHEXOL 300 MG/ML  SOLN
INTRAMUSCULAR | Status: DC | PRN
  Administered 2019-01-28: 80 mL via INTRA_ARTERIAL

## 2019-01-28 MED ORDER — HEPARIN (PORCINE) IN NACL 1000-0.9 UT/500ML-% IV SOLN
INTRAVENOUS | Status: AC
  Filled 2019-01-28: qty 1000

## 2019-01-28 MED ORDER — INSULIN DETEMIR 100 UNIT/ML ~~LOC~~ SOLN
20.0000 [IU] | Freq: Every day | SUBCUTANEOUS | Status: DC
  Filled 2019-01-28: qty 0.2

## 2019-01-28 MED ORDER — CLOPIDOGREL BISULFATE 75 MG PO TABS
ORAL_TABLET | ORAL | Status: AC
  Filled 2019-01-28: qty 4

## 2019-01-28 MED ORDER — FENTANYL CITRATE (PF) 100 MCG/2ML IJ SOLN
INTRAMUSCULAR | Status: DC | PRN
  Administered 2019-01-28: 25 ug via INTRAVENOUS

## 2019-01-28 MED ORDER — HEPARIN (PORCINE) IN NACL 1000-0.9 UT/500ML-% IV SOLN
INTRAVENOUS | Status: DC | PRN
  Administered 2019-01-28: 500 mL

## 2019-01-28 MED ORDER — SODIUM CHLORIDE 0.9 % WEIGHT BASED INFUSION: 1.0000 mL/kg/h | INTRAVENOUS | Status: DC

## 2019-01-28 MED ORDER — SODIUM CHLORIDE 0.9 % IV SOLN: 250.0000 mL | INTRAVENOUS | Status: DC | PRN

## 2019-01-28 MED ORDER — MIDAZOLAM HCL 2 MG/2ML IJ SOLN
INTRAMUSCULAR | Status: AC
  Filled 2019-01-28: qty 2

## 2019-01-28 MED ORDER — SODIUM CHLORIDE 0.9 % WEIGHT BASED INFUSION: 3.0000 mL/kg/h | INTRAVENOUS | Status: DC

## 2019-01-28 MED ORDER — VERAPAMIL HCL 2.5 MG/ML IV SOLN
INTRAVENOUS | Status: DC | PRN
  Administered 2019-01-28: 2.5 mg via INTRA_ARTERIAL

## 2019-01-28 MED ORDER — RANOLAZINE ER 500 MG PO TB12
500.0000 mg | ORAL_TABLET | Freq: Two times a day (BID) | ORAL | Status: DC
  Administered 2019-01-28 – 2019-01-29 (×2): 500 mg via ORAL
  Filled 2019-01-28 (×3): qty 1

## 2019-01-28 MED ORDER — HEPARIN SODIUM (PORCINE) 5000 UNIT/ML IJ SOLN
5000.0000 [IU] | Freq: Three times a day (TID) | INTRAMUSCULAR | Status: DC
  Administered 2019-01-28 – 2019-01-29 (×2): 5000 [IU] via SUBCUTANEOUS
  Filled 2019-01-28 (×2): qty 1

## 2019-01-28 MED ORDER — LABETALOL HCL 5 MG/ML IV SOLN: 10.0000 mg | INTRAVENOUS | Status: AC | PRN

## 2019-01-28 MED ORDER — ROSUVASTATIN CALCIUM 20 MG PO TABS
20.0000 mg | ORAL_TABLET | Freq: Every day | ORAL | Status: DC
  Administered 2019-01-29: 20 mg via ORAL
  Filled 2019-01-28: qty 2
  Filled 2019-01-28: qty 1

## 2019-01-28 MED ORDER — HEPARIN SODIUM (PORCINE) 1000 UNIT/ML IJ SOLN
INTRAMUSCULAR | Status: AC
  Filled 2019-01-28: qty 1

## 2019-01-28 MED ORDER — CLOPIDOGREL BISULFATE 75 MG PO TABS
75.0000 mg | ORAL_TABLET | Freq: Every day | ORAL | Status: DC
  Administered 2019-01-29: 75 mg via ORAL
  Filled 2019-01-28: qty 1

## 2019-01-28 MED ORDER — ACETAMINOPHEN 325 MG PO TABS
ORAL_TABLET | ORAL | Status: AC
  Filled 2019-01-28: qty 2

## 2019-01-28 MED ORDER — HEPARIN SODIUM (PORCINE) 1000 UNIT/ML IJ SOLN
INTRAMUSCULAR | Status: DC | PRN
  Administered 2019-01-28: 3000 [IU] via INTRAVENOUS

## 2019-01-28 MED ORDER — VERAPAMIL HCL 2.5 MG/ML IV SOLN
INTRAVENOUS | Status: AC
  Filled 2019-01-28: qty 2

## 2019-01-28 SURGICAL SUPPLY — 10 items
CATH INFINITI 5 FR IM (CATHETERS) ×2 IMPLANT
CATH INFINITI 5 FR JL3.5 (CATHETERS) ×2 IMPLANT
CATH INFINITI 5FR ANG PIGTAIL (CATHETERS) ×2 IMPLANT
CATH INFINITI JR4 5F (CATHETERS) ×2 IMPLANT
DEVICE RAD TR BAND REGULAR (VASCULAR PRODUCTS) ×2 IMPLANT
GLIDESHEATH SLEND SS 6F .021 (SHEATH) ×2 IMPLANT
KIT MANI 3VAL PERCEP (MISCELLANEOUS) ×3 IMPLANT
PACK CARDIAC CATH (CUSTOM PROCEDURE TRAY) ×3 IMPLANT
WIRE HITORQ VERSACORE ST 145CM (WIRE) ×2 IMPLANT
WIRE ROSEN-J .035X260CM (WIRE) ×2 IMPLANT

## 2019-01-28 NOTE — Plan of Care (Signed)
Pt compliant with medications. Denied any pain or discomfort when asked. Educated about being NPO after midnight. Voice understanding. Heparin gtt infusing per order. Will continue to monitor.   Problem: Clinical Measurements: Goal: Ability to maintain clinical measurements within normal limits will improve 01/28/2019 0518 by Gray Bernhardt, RN Outcome: Progressing 01/28/2019 0212 by Gray Bernhardt, RN Outcome: Progressing Goal: Will remain free from infection 01/28/2019 0518 by Gray Bernhardt, RN Outcome: Progressing 01/28/2019 0212 by Gray Bernhardt, RN Outcome: Progressing Goal: Diagnostic test results will improve Outcome: Progressing Goal: Cardiovascular complication will be avoided 01/28/2019 0518 by Gray Bernhardt, RN Outcome: Progressing 01/28/2019 0212 by Gray Bernhardt, RN Outcome: Progressing   Problem: Activity: Goal: Risk for activity intolerance will decrease Outcome: Progressing   Problem: Elimination: Goal: Will not experience complications related to bowel motility Outcome: Progressing Goal: Will not experience complications related to urinary retention Outcome: Progressing   Problem: Pain Managment: Goal: General experience of comfort will improve 01/28/2019 0518 by Gray Bernhardt, RN Outcome: Progressing 01/28/2019 0212 by Gray Bernhardt, RN Outcome: Progressing   Problem: Skin Integrity: Goal: Risk for impaired skin integrity will decrease Outcome: Progressing

## 2019-01-28 NOTE — Progress Notes (Signed)
Patient ID: Angela Barrett, female   DOB: [birth year], 78 y.o.   MRN: [id number]  Patient's cardiac cath per Dr End showed overall appearance of CAD similar to previous cardiac cath in October 2014 other than moderate progression of off still left circumflex disease. Medical management recommended. Patient will follow-up with Dr. Fletcher Anon remains stable she can discharge tomorrow 6/27

## 2019-01-28 NOTE — Brief Op Note (Signed)
BRIEF CARDIAC CATHETERIZATION NOTE  01/28/2019  11:10 AM  PATIENT:  Angela Barrett  78 y.o. female  PRE-OPERATIVE DIAGNOSIS:  NSTEMI  POST-OPERATIVE DIAGNOSIS:  NSTEMI  PROCEDURE:  Procedure(s): LEFT HEART CATH AND CORS/GRAFTS ANGIOGRAPHY (N/A)  SURGEON:  Surgeon(s) and Role:    * Corleen Otwell, Harrell Gave, MD - Primary  FINDINGS: 1. Significant native CAD, including CTO of mid LAD, 89-90% distal LAD stenosis, 60% ostial LCx stenosis, 80% OM2 stenosis, and CTO of mid LCx. 2. Widely patent LIMA-LAD. 3. Patent SVG->diagonal with 50% stenosis at distal anastomosis. 4. Chronically occluded SVG->PDA->OM3. 5. Mildly reduced LVEF.  RECOMMENDATIONS: 1. Overall appearance of coronary arteries is similar to prior catheterization in 05/2013 other than moderate progression of ostial LCx disease.  Given atypical symptoms, I favor medical therapy and consideration of PCI if symptoms are refractory to medical therapy. 2. Continue current doses of carvedilol and ranolazine. 3. Load with clopidogrel 300 mg x 1, followed by 75 mg daily for at least 1 year. 4. If patient feels well, consider discharge home tomorrow and outpatient f/u in 1-2 weeks with Dr. Fletcher Anon.  Nelva Bush, MD [facility] Pager: [phone]

## 2019-01-28 NOTE — Interval H&P Note (Signed)
History and Physical Interval Note:  01/28/2019 10:05 AM  Angela Barrett  has presented today for cardiac catheterization, with the diagnosis of unstable angina.  The various methods of treatment have been discussed with the patient and family. After consideration of risks, benefits and other options for treatment, the patient has consented to  Procedure(s): LEFT HEART CATH AND CORS/GRAFTS ANGIOGRAPHY (N/A) as a surgical intervention.  The patient's history has been reviewed, patient examined, no change in status, stable for surgery.  I have reviewed the patient's chart and labs.  Questions were answered to the patient's satisfaction.    Cath Lab Visit (complete for each Cath Lab visit)  Clinical Evaluation Leading to the Procedure:   ACS: Yes.    Non-ACS:  N/A  Angela Barrett

## 2019-01-28 NOTE — Care Management Important Message (Signed)
Important Message  Patient Details  Name: Angela Barrett MRN: [id number] Date of Birth: Jul 11, 1941   Medicare Important Message Given:  Yes  Initial Medicare IM given by Patient Access Associate on 01/27/2019 at 10:58am.  Still valid.   Dannette Barbara 01/28/2019, 8:20 AM

## 2019-01-28 NOTE — Progress Notes (Signed)
Progress Note  Patient Name: Angela Barrett Date of Encounter: 01/28/2019  Primary Cardiologist: Fletcher Anon  Subjective   Feels better today with less dizziness.  No further nausea.  Notes occasional mild chest pain, consistent with her chronic stable angina.  Inpatient Medications    Scheduled Meds: . acetaminophen      . [MAR Hold] aspirin EC  81 mg Oral Daily  . [MAR Hold] carvedilol  3.125 mg Oral BID WC  . clopidogrel      . [START ON 01/29/2019] clopidogrel  75 mg Oral Q breakfast  . heparin  5,000 Units Subcutaneous Q8H  . [MAR Hold] insulin aspart  0-20 Units Subcutaneous TID WC  . [MAR Hold] insulin aspart  0-5 Units Subcutaneous QHS  . [START ON 01/29/2019] insulin detemir  20 Units Subcutaneous QHS  . [MAR Hold] latanoprost  1 drop Both Eyes QHS  . [MAR Hold] levothyroxine  88 mcg Oral Q0600  . [MAR Hold] lisinopril  5 mg Oral Q breakfast  . [MAR Hold] LORazepam  2.5 mg Oral QHS  . [MAR Hold] pantoprazole  40 mg Oral QAC breakfast  . [MAR Hold] ranolazine  1,000 mg Oral BID  . [START ON 01/29/2019] rosuvastatin  20 mg Oral Q breakfast  . [MAR Hold] sertraline  25 mg Oral Daily  . [MAR Hold] sodium chloride flush  3 mL Intravenous Q12H  . sodium chloride flush  3 mL Intravenous Q12H   Continuous Infusions: . [MAR Hold] sodium chloride    . sodium chloride    . sodium chloride     PRN Meds: [MAR Hold] sodium chloride, sodium chloride, [MAR Hold] acetaminophen, [MAR Hold] ALPRAZolam, hydrALAZINE, labetalol, [MAR Hold] meclizine, [MAR Hold]  morphine injection, [MAR Hold] nitroGLYCERIN, [MAR Hold] ondansetron (ZOFRAN) IV, [MAR Hold] ondansetron, [MAR Hold] polyvinyl alcohol, [MAR Hold] sodium chloride flush, sodium chloride flush   Vital Signs    Vitals:   01/28/19 1115 01/28/19 1130 01/28/19 1145 01/28/19 1200  BP: (!) 143/74 126/69 130/90 133/64  Pulse: 63 62 62 62  Resp: 14 15 12 19   Temp:      TempSrc:      SpO2: 99% 95% 97% 97%  Weight:      Height:        Intake/Output Summary (Last 24 hours) at 01/28/2019 1216 Last data filed at 01/28/2019 0855 Gross per 24 hour  Intake 435.23 ml  Output 0 ml  Net 435.23 ml   Last 3 Weights 01/28/2019 01/28/2019 01/27/2019  Weight (lbs) 134 lb 7.7 oz 134 lb 8 oz 134 lb 8 oz  Weight (kg) 61 kg 61.009 kg 61.009 kg      Telemetry    Sinus rhythm- Personally Reviewed  ECG    Normal sinus rhythm with left axis deviation and marked diffuse T wave inversions, new from prior tracing.  QT also prolonged. - Personally Reviewed  Physical Exam   GEN: No acute distress.   Neck: No JVD Cardiac: RRR, no murmurs, rubs, or gallops.  Respiratory: Clear to auscultation bilaterally. GI: Soft, nontender, non-distended  MS: No edema; No deformity. Neuro:  Nonfocal  Psych: Normal affect   Labs    High Sensitivity Troponin:   Recent Labs  Lab 01/26/19 1406 01/26/19 1534 01/26/19 1857 01/26/19 2047  TROPONINIHS 245* 562* 769* 836*      Cardiac EnzymesNo results for input(s): TROPONINI in the last 168 hours. No results for input(s): TROPIPOC in the last 168 hours.   Chemistry Recent Labs  Lab  01/26/19 1406 01/28/19 0414  NA 139 139  K 5.1 4.0  CL 106 105  CO2 24 23  GLUCOSE 239* 104*  BUN 18 21  CREATININE 1.13* 1.33*  CALCIUM 8.9 9.2  PROT 7.0  --   ALBUMIN 4.1  --   AST 20  --   ALT 19  --   ALKPHOS 47  --   BILITOT 0.5  --   GFRNONAA 47* 38*  GFRAA 54* 45*  ANIONGAP 9 11     Hematology Recent Labs  Lab 01/26/19 1406 01/27/19 0103 01/28/19 0415  WBC 10.7* 7.3 8.3  RBC 3.29* 3.04* 3.44*  HGB 10.8* 10.0* 11.2*  HCT 32.5* 29.9* 33.0*  MCV 98.8 98.4 95.9  MCH 32.8 32.9 32.6  MCHC 33.2 33.4 33.9  RDW 12.3 12.2 12.3  PLT 274 244 299    BNPNo results for input(s): BNP, PROBNP in the last 168 hours.   DDimer No results for input(s): DDIMER in the last 168 hours.   Radiology    Dg Chest Port 1 View  Result Date: 01/26/2019 CLINICAL DATA:  Weakness since this morning, vomiting  since this morning, history coronary artery disease post MI and CABG, hypertension EXAM: PORTABLE CHEST 1 VIEW COMPARISON:  Portable exam 1427 hours compared to 08/21/2014 FINDINGS: Upper normal size of cardiac silhouette post CABG. Mediastinal contours and pulmonary vascularity normal. Atherosclerotic calcification aorta. Minimal scarring at LEFT costophrenic angle. Lungs otherwise clear. No infiltrate, pleural effusion, or pneumothorax. Mild elevation of RIGHT diaphragm. IMPRESSION: Post CABG. No acute abnormalities. Electronically Signed   By: Lavonia Dana M.D.   On: 01/26/2019 15:07    Cardiac Studies   LHC (01/28/2019, prelim): 1. Significant native CAD, including CTO of mid LAD, 89-90% distal LAD stenosis, 60% ostial LCx stenosis, 80% OM2 stenosis, and CTO of mid LCx. 2. Widely patent LIMA-LAD. 3. Patent SVG->diagonal with 50% stenosis at distal anastomosis. 4. Chronically occluded SVG->PDA->OM3. 5. Mildly reduced LVEF.   Echo (01/27/2019):  1. Moderate hypokinesis of the left ventricular, mid-apical anteroseptal wall, inferoseptal wall and inferior segment.  2. The left ventricle has mildly reduced systolic function, with an ejection fraction of 45-50%. The cavity size was normal. There is mild concentric left ventricular hypertrophy. Left ventricular diastolic Doppler parameters are consistent with  impaired relaxation. The E/e' is 17.4.  3. The right ventricle has normal systolic function. The cavity was normal. There is no increase in right ventricular wall thickness. Right ventricular systolic pressure borderline elevated.  4. Left atrial size was mildly dilated.  5. No evidence of mitral valve stenosis.  6. The aortic valve is tricuspid. No stenosis of the aortic valve.  Patient Profile     78 y.o. female with history of CAD status post four-vessel CABG in6/2014 status post PCI to the native proximal and mid RCA in 05/2013, diabetes, hypertension, hyperlipidemia, hypothyroidism,  IBS, anxiety, dizziness/gait disturbance, vertigo depression, and GERD, admitted with dizziness, nausea/vomiting, and elevated troponin.  Assessment & Plan    Elevated troponin: Symptoms remain atypical, though today Ms. Bauers endorses some intermittent chest pain that is consistent with her chronic stable angina.  EKG today shows marked diffuse T wave inversions with QT prolongation concerning for ischemia.  However, catheterization shows relatively stable CAD with progression noted only at the ostial circumflex (not critical).  I wonder if her presentation may reflect a degree of stress-induced cardiomyopathy.  Medical therapy of CAD.  If patient has refractory symptoms, PCI to the LCx/OM3 could be considered in the  future, though this supplies a relatively small territory  Continue current dose of carvedilol.  I will decrease ranolazine to 500 mg twice daily in the setting of QT prolongation and borderline renal function.  Load with clopidogrel 300mg  x 1 followed by 75 mg daily.  Cardiomyopathy: LVEF noted to be mildly reduced by echocardiogram and catheterization.  While underlying ischemic cardiomyopathy is likely contributing to this, her presentation and EKG could also be seen with stress-induced cardiomyopathy.  Continue current doses of carvedilol and lisinopril.  Consider escalation of lisinopril prior to discharge, if blood pressure allows.  QT prolongation: New since admission.  Avoid QT prolonging medications.  In particular, will discontinue ondansetron.  Repeat EKG in the morning.  Will decrease ranolazine to 500 mg twice daily.  Discontinuation may have to be considered in the future if QT prolongation persists.  Hyperlipidemia: Patient on rosuvastatin 5 mg daily.  Check lipid panel with morning labs.  May need to escalate rosuvastatin if LDL above goal of 70.  Paroxysmal SVT: No significant arrhythmia noted on telemetry.  Continue metoprolol.  Dizziness, nausea,  and vomiting:  Improved since discharge.  Further management per internal medicine.  Disposition: If symptoms continue to improve, discharge tomorrow with follow-up in the office with Dr. Fletcher Anon or an APP in 1 to 2 weeks could be considered.  For questions or updates, please contact [place] Please consult [email or web address] for contact info under [facility].  Signed, Nelva Bush, MD  01/28/2019, 12:16 PM

## 2019-01-28 NOTE — Progress Notes (Signed)
Cardiac Rehab Navigator/ Exercise Physiology Note  "Heart Attack Bouncing Back" booklet given and reviewed with patient. Discussed the definition of CAD.  Discussed modifiable risk factors including controlling blood pressure, cholesterol, and blood sugar; following heart healthy diet; maintaining healthy weight; exercise; and smoking cessation, if applicable.  Discussed cardiac medications including rationale for taking, mechanisms of action, and side effects. Stressed the importance of taking medications as prescribed.  Discussed emergency plan for heart attack symptoms. Patient verbalized understanding of need to call 911 and not to drive himself to ER if having cardiac symptoms / chest pain. If patient going home on NTG or if you know patient is being discharged on NTG, then you may discuss and document that here.  Diet of low sodium, low fat, low cholesterol heart healthy / carb modified diet discussed. Information on diet provided.  Smoking Cessation - Patient is a NEVER smoker. Patient has been advised to quit by his / her cardiologist.  Exercise - Benefits of exercised discussed. Patient is sedentary. Patient has a history of falls (8), vertido, vision problems after cataract surgery, and dizziness. Patient uses a walker. Informed patient her cardiologist has referred her to outpatient Cardiac Rehab. An overview of the program was provided. Brochure and informational letter given to patient. Patient is not interested in participating. Patient reports that her husband has dementia and Hospice is taking care of him at her home. Patient wants to get back home to him as soon as possible. Patient does not want to spend any time away from him right now. Patient will think about participating in Cardiac Rehab in the future. Patient could benefit from PT and/or [place] first but denies that at this time, due to wanting to focus on her husband. Patient was informed that the department is closed and that we  will reach out to her when the department reopens. Patient was given contact information incase she changes her mind and wants to participate sooner.   Patient appreciative of the information.  Jasper Loser, [facility]  Exercise Physiologist Department Phone #: [phone] Fax: [phone]  Direct Line [phone] Email Address: [email or web address]

## 2019-01-28 NOTE — Progress Notes (Signed)
[place] [facility] [place] NAME: Angela Barrett    MR#:  [id number]  DATE OF BIRTH:  [birth year]  SUBJECTIVE:  No new complaints Uneventful day y'day REVIEW OF SYSTEMS:   Review of Systems  Constitutional: Negative for chills, fever and weight loss.  HENT: Negative for ear discharge, ear pain and nosebleeds.   Eyes: Negative for blurred vision, pain and discharge.  Respiratory: Negative for sputum production, shortness of breath, wheezing and stridor.   Cardiovascular: Negative for chest pain, palpitations, orthopnea and PND.  Gastrointestinal: Negative for abdominal pain, diarrhea, nausea and vomiting.  Genitourinary: Negative for frequency and urgency.  Musculoskeletal: Negative for back pain and joint pain.  Neurological: Positive for weakness. Negative for sensory change, speech change and focal weakness.  Psychiatric/Behavioral: Negative for depression and hallucinations. The patient is not nervous/anxious.    Tolerating Diet: yes Tolerating PT: ambulatory  DRUG ALLERGIES:   Allergies  Allergen Reactions  . Amitriptyline Other (See Comments)    Disorientation   . Cortisone Other (See Comments)    Makes eyes hurt and sugar goes up  . Lipitor [Atorvastatin]     Fatigue and weakness and high and low doses   . Macrodantin [Nitrofurantoin] Itching and Swelling  . Motrin [Ibuprofen] Other (See Comments)    Causes fluid retention    VITALS:  Blood pressure 123/61, pulse 60, temperature 97.7 F (36.5 C), temperature source Oral, resp. rate 20, height 5\' 4"  (1.626 m), weight 61 kg, SpO2 98 %.  PHYSICAL EXAMINATION:   Physical Exam  GENERAL:  78 y.o.-year-old patient lying in the bed with no acute distress.  EYES: Pupils equal, round, reactive to light and accommodation. No scleral icterus. Extraocular muscles intact.  HEENT: Head atraumatic, normocephalic. Oropharynx and nasopharynx clear.  NECK:  Supple, no jugular venous  distention. No thyroid enlargement, no tenderness.  LUNGS: Normal breath sounds bilaterally, no wheezing, rales, rhonchi. No use of accessory muscles of respiration.  CARDIOVASCULAR: S1, S2 normal. No murmurs, rubs, or gallops.  ABDOMEN: Soft, nontender, nondistended. Bowel sounds present. No organomegaly or mass.  EXTREMITIES: No cyanosis, clubbing or edema b/l.    NEUROLOGIC: Cranial nerves II through XII are intact. No focal Motor or sensory deficits b/l.   PSYCHIATRIC:  patient is alert and oriented x 3.  SKIN: No obvious rash, lesion, or ulcer.   LABORATORY PANEL:  CBC Recent Labs  Lab 01/28/19 0415  WBC 8.3  HGB 11.2*  HCT 33.0*  PLT 299    Chemistries  Recent Labs  Lab 01/26/19 1406 01/28/19 0414  NA 139 139  K 5.1 4.0  CL 106 105  CO2 24 23  GLUCOSE 239* 104*  BUN 18 21  CREATININE 1.13* 1.33*  CALCIUM 8.9 9.2  AST 20  --   ALT 19  --   ALKPHOS 47  --   BILITOT 0.5  --    Cardiac Enzymes No results for input(s): TROPONINI in the last 168 hours. RADIOLOGY:  Dg Chest Port 1 View  Result Date: 01/26/2019 CLINICAL DATA:  Weakness since this morning, vomiting since this morning, history coronary artery disease post MI and CABG, hypertension EXAM: PORTABLE CHEST 1 VIEW COMPARISON:  Portable exam 1427 hours compared to 08/21/2014 FINDINGS: Upper normal size of cardiac silhouette post CABG. Mediastinal contours and pulmonary vascularity normal. Atherosclerotic calcification aorta. Minimal scarring at LEFT costophrenic angle. Lungs otherwise clear. No infiltrate, pleural effusion, or pneumothorax. Mild elevation of RIGHT diaphragm. IMPRESSION: Post CABG. No  acute abnormalities. Electronically Signed   By: Lavonia Dana M.D.   On: 01/26/2019 15:07   ASSESSMENT AND PLAN:   Burlene Montecalvo  is a 78 y.o. female with a known history per below which includes diabetes, coronary artery disease status post MI, hypertension, hypothyroidism, presenting to the emergency room with  generalized weakness, fatigue, lightheadedness, associated with multiple episodes of emesis, in the emergency room patient was found to have troponin greater than 240.  *Elevated high-sensitivity troponin in a patient with history of CAD status post PCI and CABG consistent with non-QWMI -patient on IV heparin drip -cardiac cath planned for today -appreciate cardiology input by Dr. Nelva Bush -continue aspirin, Coreg, Ranexa, Crestor -Echo:  Focal wall motion abnormality in mid to apical anterosepal, inferoseptal, and inferior walls. This is different than prior,concerning for ischemia. Calculated EF higher than actual given that this is measured from the base. Visual EF appears to be around 45%.  *Dizziness/shakiness. No further episodes. -She has known history of vertigo. -I recommended her outpatient ENT evaluation  *Hypertension continue blood pressure meds  *Hyperlipidemia on Crestor   CODE STATUS: full  DVT Prophylaxis: heparin drip  TOTAL TIME TAKING CARE OF THIS PATIENT: *30 minutes.  >50% time spent on counselling and coordination of care  POSSIBLE D/C IN *1-2* DAYS, DEPENDING ON CLINICAL CONDITION.  Note: This dictation was prepared with Dragon dictation along with smaller phrase technology. Any transcriptional errors that result from this process are unintentional.  Fritzi Mandes M.D on 01/28/2019 at 8:30 AM  Between 7am to 6pm - Pager - [phone]  After 6pm go to [email or web address] - password [email or web address] [facility]  Office  [phone]  CC: Primary care physician; Burnard Bunting, MDPatient ID: Angela Barrett, female   DOB: [birth year], 78 y.o.   MRN: [id number]

## 2019-01-28 NOTE — Progress Notes (Signed)
ANTICOAGULATION CONSULT NOTE - Initial Consult  Pharmacy Consult for Heparin  Indication: chest pain/ACS  Allergies  Allergen Reactions  . Amitriptyline Other (See Comments)    Disorientation   . Cortisone Other (See Comments)    Makes eyes hurt and sugar goes up  . Lipitor [Atorvastatin]     Fatigue and weakness and high and low doses   . Macrodantin [Nitrofurantoin] Itching and Swelling  . Motrin [Ibuprofen] Other (See Comments)    Causes fluid retention    Patient Measurements: Height: 5\' 4"  (162.6 cm) Weight: 134 lb 8 oz (61 kg) IBW/kg (Calculated) : 54.7 Heparin Dosing Weight: 59 kg   Vital Signs: Temp: 97.9 F (36.6 C) (06/26 0343) Temp Source: Oral (06/26 0343) BP: 110/59 (06/26 0343) Pulse Rate: 61 (06/26 0343)  Labs: Recent Labs    01/26/19 1406 01/26/19 1534  01/27/19 0103 01/27/19 0955 01/27/19 2023 01/28/19 0414 01/28/19 0415  HGB 10.8*  --   --  10.0*  --   --   --  11.2*  HCT 32.5*  --   --  29.9*  --   --   --  33.0*  PLT 274  --   --  244  --   --   --  299  APTT  --  30  --   --   --   --   --   --   LABPROT  --  13.5  --   --   --   --   --   --   INR  --  1.0  --   --   --   --   --   --   HEPARINUNFRC  --   --    < > 0.18* 0.89* 0.56 0.62  --   CREATININE 1.13*  --   --   --   --   --   --   --    < > = values in this interval not displayed.    Estimated Creatinine Clearance: 36 mL/min (A) (by C-G formula based on SCr of 1.13 mg/dL (H)).   Medical History: Past Medical History:  Diagnosis Date  . Anemia    "after OHS in 01/2013" (05/25/2013)  . Anginal pain ([place])   . Anxiety   . Arthritis    "joints" (05/25/2013)  . At risk for falls   . Coronary artery disease    a. nl myoview ~ 2006;  b. 01/2013 s/p MI and CABG x 4 (VG->D2, VG->RPDA->OM3, LIMA->LAD;  c. 05/2013 Cath/PCI: LM nl, LAD 60-70p/m, D1 70p, LCX 157m, OM2 60p, RCA 50-60p/11m (3.0x33 Xience DES), VG->D2 60, VG->RPDA/OM3 100, LIMA->LAD nl w 70 dLAD, EF 50%.  . Depression    . Diabetes mellitus, type 2 ([place])    "dx'd 1992" (05/25/2013)  . Dyspnea    DOE  . Gait disturbance    uses walker some  . GERD (gastroesophageal reflux disease)   . Glaucoma    bilateral  . Headache(784.0)    "monthly" (05/25/2013)  . History of hiatal hernia   . History of pneumonia 1985  . Hyperlipidemia   . Hypertension   . Hypothyroidism   . IBS (irritable bowel syndrome)   . Lymphocytosis 03/24/2012  . Myocardial infarction ([place])    2014  . OA (osteoarthritis)   . PONV (postoperative nausea and vomiting)   . Vertigo     Medications:  No PTA anticoagulants - confirmed with patient.   Assessment: Angela Barrett is  34 YOF who presented with emesis this morning that was unresolved. Pharmacy was consulted for ACS/STEMI. Patient's troponin level is elevated and EKG is pending at this time. Heparin DW:  59 kg   6/24 heparin infusion started @ 700 units/hr 6/24 @ 0100 HL 0.18. Level is subtherapeutic. 1500 unit bolus Increase heparin infusion to 850 units/hr 6/25 @ 0955 HL 0.89 Level is supratherapeutic. Rate reduced to 750 units/hr  6/25 :  HL @ 2023 = 0.56 - level therapeutic x 1 6/26 @ 0414 HL: 0.62- level therapeutic x 2   Goal of Therapy:  Heparin level 0.3-0.7 units/ml Monitor platelets by anticoagulation protocol: Yes   Plan:  6/26 @ 0414 HL: 0.62- level therapeutic x 2 Continue current heparin infusion rate of 750 units/hr Continue to check anti-Xa level and CBC daily while on heparin, per protocol Continue to monitor H&H and platelets   Pernell Dupre, PharmD, BCPS Clinical Pharmacist 01/28/2019 5:05 AM

## 2019-01-29 ENCOUNTER — Other Ambulatory Visit: Payer: Self-pay | Admitting: Cardiovascular Disease

## 2019-01-29 DIAGNOSIS — I255 Ischemic cardiomyopathy: Secondary | ICD-10-CM

## 2019-01-29 DIAGNOSIS — R9431 Abnormal electrocardiogram [ECG] [EKG]: Secondary | ICD-10-CM

## 2019-01-29 LAB — CBC
HCT: 31.2 % — ABNORMAL LOW (ref 36.0–46.0)
Hemoglobin: 10.3 g/dL — ABNORMAL LOW (ref 12.0–15.0)
MCH: 32.8 pg (ref 26.0–34.0)
MCHC: 33 g/dL (ref 30.0–36.0)
MCV: 99.4 fL (ref 80.0–100.0)
Platelets: 248 10*3/uL (ref 150–400)
RBC: 3.14 MIL/uL — ABNORMAL LOW (ref 3.87–5.11)
RDW: 12.6 % (ref 11.5–15.5)
WBC: 5.5 10*3/uL (ref 4.0–10.5)
nRBC: 0 % (ref 0.0–0.2)

## 2019-01-29 LAB — BASIC METABOLIC PANEL
Anion gap: 9 (ref 5–15)
BUN: 18 mg/dL (ref 8–23)
CO2: 23 mmol/L (ref 22–32)
Calcium: 8.7 mg/dL — ABNORMAL LOW (ref 8.9–10.3)
Chloride: 109 mmol/L (ref 98–111)
Creatinine, Ser: 1.27 mg/dL — ABNORMAL HIGH (ref 0.44–1.00)
GFR calc Af Amer: 47 mL/min — ABNORMAL LOW (ref >60)
GFR calc non Af Amer: 41 mL/min — ABNORMAL LOW (ref >60)
Glucose, Bld: 156 mg/dL — ABNORMAL HIGH (ref 70–99)
Potassium: 4.2 mmol/L (ref 3.5–5.1)
Sodium: 141 mmol/L (ref 135–145)

## 2019-01-29 LAB — GLUCOSE, CAPILLARY
Glucose-Capillary: 154 mg/dL — ABNORMAL HIGH (ref 70–99)
Glucose-Capillary: 195 mg/dL — ABNORMAL HIGH (ref 70–99)

## 2019-01-29 MED ORDER — CLOPIDOGREL BISULFATE 75 MG PO TABS: 75.0000 mg | ORAL_TABLET | Freq: Every day | ORAL | 0 refills | Status: DC

## 2019-01-29 MED ORDER — CARVEDILOL 3.125 MG PO TABS: 3.1250 mg | ORAL_TABLET | Freq: Two times a day (BID) | ORAL | 0 refills | Status: DC

## 2019-01-29 NOTE — TOC Transition Note (Signed)
Transition of Care [facility]) - CM/SW Discharge Note   Patient Details  Name: Angela Barrett MRN: [id number] Date of Birth: 10/09/1940  Transition [facility] Care [facility] CM/SW Contact:  Latanya Maudlin, RN Phone Number: 01/29/2019, 3:11 PM   Clinical Narrative:  Patient to be discharged per MD order. Orders in place for home health services. CMS Medicare.gov Compare Post Acute Care list reviewed with patient and since she recently closed with [facility] she prefers to use them again. Referral placed with [facility] No DME needs. Daughter to transport.      Final next level of care: Home w Home Health Services Barriers to Discharge: No Barriers Identified   Patient Goals and CMS Choice     Choice offered to / list presented to : Patient  Discharge Placement                       Discharge Plan and Services                          HH Arranged: RN, PT [facility] Agency: [place] ([facility]) Date [facility] Agency Contacted: 01/29/19 Time [place]: 1200    Social Determinants of Health (SDOH) Interventions     Readmission Risk Interventions No flowsheet data found.

## 2019-01-29 NOTE — Progress Notes (Signed)
Patient given discharge instructions. IV's taken out and tele monitor off. Patient verbalized understanding with no questions or concerns. Asked patient if she would like me to call someone to go over discharge instructions, she declined. Patient's daughter will be picking her up.

## 2019-01-29 NOTE — Discharge Summary (Signed)
[facility] [place] at [facility], 78 y.o., DOB 10/26/1940, MRN [id number]. Admission date: 01/26/2019 Discharge Date 01/29/2019 Primary MD Burnard Bunting, MD Admitting Physician Gorden Harms, MD  Admission Diagnosis  Weakness [R53.1] Troponin I above reference range [R79.89]  Discharge Diagnosis   Active Problems: Elevated troponin due to underlying coronary artery disease Dizziness according to cardiology he will place event monitor Prolonged QT Essential hypertension Hyperlipidemia Coronary artery disease Antihypertensive Glaucoma GERD Diabetes type 2 Angela Barrett  is a 78 y.o. female with a known history per below which includes diabetes, coronary artery disease status post MI, hypertension, hypothyroidism, presenting to the emergency room with generalized weakness, fatigue, lightheadedness, associated with multiple episodes of emesis, in the emergency room patient was found to have troponin greater than 240.  Patient was admitted and was seen by cardiology underwent cardiac cath which showed chronic occlusive disease.  Patient was recommended to be treated medically.  She was also noticed to have prolonged QT her Ranexa was discontinued.  Patient will need a repeat EKG.  Cardiology will also do a event monitor for home.            Consults  cardiology  Significant Tests:  See full reports for all details     Dg Chest Port 1 View  Result Date: 01/26/2019 CLINICAL DATA:  Weakness since this morning, vomiting since this morning, history coronary artery disease post MI and CABG, hypertension EXAM: PORTABLE CHEST 1 VIEW COMPARISON:  Portable exam 1427 hours compared to 08/21/2014 FINDINGS: Upper normal size of cardiac silhouette post CABG. Mediastinal contours and pulmonary vascularity normal. Atherosclerotic calcification aorta. Minimal scarring at LEFT costophrenic angle. Lungs otherwise clear. No  infiltrate, pleural effusion, or pneumothorax. Mild elevation of RIGHT diaphragm. IMPRESSION: Post CABG. No acute abnormalities. Electronically Signed   By: Lavonia Dana M.D.   On: 01/26/2019 15:07       Today   Subjective:   Angela Barrett patient denies any chest pain Objective:   Blood pressure 131/63, pulse 63, temperature 97.6 F (36.4 C), temperature source Oral, resp. rate 20, height 5\' 4"  (1.626 m), weight 59.1 kg, SpO2 97 %.  .  Intake/Output Summary (Last 24 hours) at 01/29/2019 1658 Last data filed at 01/29/2019 1310 Gross per 24 hour  Intake 3 ml  Output 1100 ml  Net -1097 ml    Exam VITAL SIGNS: Blood pressure 131/63, pulse 63, temperature 97.6 F (36.4 C), temperature source Oral, resp. rate 20, height 5\' 4"  (1.626 m), weight 59.1 kg, SpO2 97 %.  GENERAL:  78 y.o.-year-old patient lying in the bed with no acute distress.  EYES: Pupils equal, round, reactive to light and accommodation. No scleral icterus. Extraocular muscles intact.  HEENT: Head atraumatic, normocephalic. Oropharynx and nasopharynx clear.  NECK:  Supple, no jugular venous distention. No thyroid enlargement, no tenderness.  LUNGS: Normal breath sounds bilaterally, no wheezing, rales,rhonchi or crepitation. No use of accessory muscles of respiration.  CARDIOVASCULAR: S1, S2 normal. No murmurs, rubs, or gallops.  ABDOMEN: Soft, nontender, nondistended. Bowel sounds present. No organomegaly or mass.  EXTREMITIES: No pedal edema, cyanosis, or clubbing.  NEUROLOGIC: Cranial nerves II through XII are intact. Muscle strength 5/5 in all extremities. Sensation intact. Gait not checked.  PSYCHIATRIC: The patient is alert and oriented x 3.  SKIN: No obvious rash, lesion, or ulcer.   Data Review     CBC w Diff:  Lab Results  Component  Value Date   WBC 5.5 01/29/2019   HGB 10.3 (L) 01/29/2019   HGB 11.7 (L) 08/21/2014   HCT 31.2 (L) 01/29/2019   HCT 34.8 (L) 08/21/2014   PLT 248 01/29/2019   PLT 257  08/21/2014   LYMPHOPCT 18 01/26/2019   MONOPCT 6 01/26/2019   EOSPCT 1 01/26/2019   BASOPCT 0 01/26/2019   CMP:  Lab Results  Component Value Date   NA 141 01/29/2019   NA 141 08/21/2014   K 4.2 01/29/2019   K 4.5 08/21/2014   CL 109 01/29/2019   CL 109 (H) 08/21/2014   CO2 23 01/29/2019   CO2 25 08/21/2014   BUN 18 01/29/2019   BUN 17 08/21/2014   CREATININE 1.27 (H) 01/29/2019   CREATININE 1.02 08/21/2014   PROT 7.0 01/26/2019   PROT 7.1 08/21/2014   ALBUMIN 4.1 01/26/2019   ALBUMIN 3.8 08/21/2014   BILITOT 0.5 01/26/2019   BILITOT 0.5 08/21/2014   ALKPHOS 47 01/26/2019   ALKPHOS 49 08/21/2014   AST 20 01/26/2019   AST 23 08/21/2014   ALT 19 01/26/2019   ALT 23 08/21/2014  .  Micro Results Recent Results (from the past 240 hour(s))  Urine culture     Status: Abnormal   Collection Time: 01/26/19  2:10 PM   Specimen: Urine, Random  Result Value Ref Range Status   Specimen Description   Final    URINE, RANDOM Performed at [facility], [address], [place], [place] [address]    Special Requests   Final    NONE Performed at [facility] [address] [place],  [address]    Culture MULTIPLE SPECIES PRESENT, SUGGEST RECOLLECTION (A)  Final   Report Status 01/27/2019 FINAL  Final  SARS Coronavirus 2 (CEPHEID - Performed in [facility] hospital lab), Hosp Order     Status: None   Collection Time: 01/26/19  3:34 PM   Specimen: Nasopharyngeal Swab  Result Value Ref Range Status   SARS Coronavirus 2 NEGATIVE NEGATIVE Final    Comment: (NOTE) If result is NEGATIVE SARS-CoV-2 target nucleic acids are NOT DETECTED. The SARS-CoV-2 RNA is generally detectable in upper and lower  respiratory specimens during the acute phase of infection. The lowest  concentration of SARS-CoV-2 viral copies this assay can detect is 250  copies / mL. A negative result does not preclude SARS-CoV-2 infection  and should not be used as the sole basis for  treatment or other  patient management decisions.  A negative result may occur with  improper specimen collection / handling, submission of specimen other  than nasopharyngeal swab, presence of viral mutation(s) within the  areas targeted by this assay, and inadequate number of viral copies  (<250 copies / mL). A negative result must be combined with clinical  observations, patient history, and epidemiological information. If result is POSITIVE SARS-CoV-2 target nucleic acids are DETECTED. The SARS-CoV-2 RNA is generally detectable in upper and lower  respiratory specimens dur ing the acute phase of infection.  Positive  results are indicative of active infection with SARS-CoV-2.  Clinical  correlation with patient history and other diagnostic information is  necessary to determine patient infection status.  Positive results do  not rule out bacterial infection or co-infection with other viruses. If result is PRESUMPTIVE POSTIVE SARS-CoV-2 nucleic acids MAY BE PRESENT.   A presumptive positive result was obtained on the submitted specimen  and confirmed on repeat testing.  While 2019 novel coronavirus  (SARS-CoV-2) nucleic  acids may be present in the submitted sample  additional confirmatory testing may be necessary for epidemiological  and / or clinical management purposes  to differentiate between  SARS-CoV-2 and other Sarbecovirus currently known to infect humans.  If clinically indicated additional testing with an alternate test  methodology [phone]) is advised. The SARS-CoV-2 RNA is generally  detectable in upper and lower respiratory sp ecimens during the acute  phase of infection. The expected result is Negative. Fact Sheet for Patients:  StrictlyIdeas.no Fact Sheet for Healthcare Providers: BankingDealers.co.za This test is not yet approved or cleared by the [place] FDA and has been authorized for detection and/or  diagnosis of SARS-CoV-2 by FDA under an Emergency Use Authorization (EUA).  This EUA will remain in effect (meaning this test can be used) for the duration of the COVID-19 declaration under Section 564(b)(1) of the Act, 21 U.S.C. section 360bbb-3(b)(1), unless the authorization is terminated or revoked sooner. Performed at [facility], [address] [place], [place] [address]      Code Status History    Date Active Date Inactive Code Status Order ID Comments User Context   01/26/2019 [place] 01/29/2019 1634 Full Code [id number]  Gorden Harms, MD Inpatient   01/07/2013 1408 01/12/2013 1601 Full Code [id number]  John Giovanni, PA-C Inpatient   Advance Care Planning Activity    Advance Directive Documentation     Most Recent Value  Type of Advance Directive  Healthcare Power of Attorney, Living will  Pre-existing out of facility DNR order (yellow form or pink MOST form)  -  "MOST" Form in Place?  -          Follow-up Information    [facility] Follow up.   Specialty: Cardiac Rehabilitation Why: Your Cardiologist has referred you to Cardiac Rehab. The Dept. is currently closed due to COVID-19. We are offering Virtual Rehab. We will be reopening soon. The Dept. will contact you within 1-2 weeks.  Contact information: [place] [phone] ar [place], MD Follow up in 6 day(s).   Specialty: Cardiology Why: hosp f/u Contact information: [place] [address] [phone]        Burnard Bunting, MD Follow up in 10 day(s).   Specialty: Internal Medicine Contact information: [address] [place] [address] [phone]           Discharge Medications   Allergies as of 01/29/2019      Reactions   Amitriptyline Other (See Comments)   Disorientation   Cortisone Other (See Comments)   Makes eyes hurt and sugar goes up    Lipitor [atorvastatin]    Fatigue and weakness and high and low doses   Macrodantin [nitrofurantoin] Itching, Swelling   Motrin [ibuprofen] Other (See Comments)   Causes fluid retention      Medication List    STOP taking these medications   ranolazine 1000 MG SR tablet Commonly known as: RANEXA     TAKE these medications   aspirin 81 MG tablet Take 1 tablet (81 mg total) by mouth daily.   B-D INS SYRINGE 0.5CC/31GX5/16 31G X 5/16" 0.5 ML Misc Generic drug: Insulin Syringe-Needle U-100 daily. as directed   B-D ULTRAFINE III SHORT PEN 31G X 8 MM Misc Generic drug: Insulin Pen Needle daily. as directed   carvedilol 3.125 MG tablet Commonly known as: COREG Take 1 tablet (3.125 mg total)  by mouth 2 (two) times daily with a meal.   clopidogrel 75 MG tablet Commonly known as: PLAVIX Take 1 tablet (75 mg total) by mouth daily with breakfast. Start taking on: January 30, 2019   latanoprost 0.005 % ophthalmic solution Commonly known as: XALATAN Place 1 drop into both eyes at bedtime.   Levemir 100 UNIT/ML injection Generic drug: insulin detemir Inject 20 Units into the skin at bedtime. Sliding scale   levothyroxine 88 MCG tablet Commonly known as: SYNTHROID Take 88 mcg by mouth daily before breakfast.   lisinopril 5 MG tablet Commonly known as: ZESTRIL TAKE 1 TABLET BY MOUTH EVERY DAY   LORazepam 1 MG tablet Commonly known as: ATIVAN Take 2.5 mg by mouth at bedtime.   meclizine 25 MG tablet Commonly known as: ANTIVERT Take 25 mg by mouth daily.   metFORMIN 500 MG 24 hr tablet Commonly known as: GLUCOPHAGE-XR Take 1 tablet (500 mg total) by mouth daily with breakfast. What changed: when to take this   nitroGLYCERIN 0.4 MG SL tablet Commonly known as: NITROSTAT PLACE 1 TABLET (0.4 MG TOTAL) UNDER THE TONGUE EVERY 5 (FIVE) MINUTES AS NEEDED FOR CHEST PAIN.   ondansetron 4 MG disintegrating tablet Commonly known as: Zofran ODT Take 1 tablet (4 mg total) by mouth  every 8 (eight) hours as needed for nausea or vomiting.   ONE TOUCH ULTRA TEST test strip Generic drug: glucose blood USE AS DIRECTED TO TEST BLOOD SUGAR 3 TIMES A DAY DX E11.51   pantoprazole 40 MG tablet Commonly known as: PROTONIX Take 40 mg by mouth daily before breakfast.   rosuvastatin 5 MG tablet Commonly known as: CRESTOR Take 1 tablet (5 mg total) by mouth daily with breakfast.   sertraline 25 MG tablet Commonly known as: ZOLOFT Take 25 mg by mouth daily.   Theratears 0.25 % Soln Generic drug: Carboxymethylcellulose Sodium Place 1 drop into both eyes 3 (three) times daily as needed (for dry/irritated eyes.).   Tylenol Arthritis Pain 650 MG CR tablet Generic drug: acetaminophen Take 650 mg by mouth at bedtime.          Total Time in preparing paper work, data evaluation and todays exam - 53 minutes  Dustin Flock M.D on 01/29/2019 at 4:58 PM [place]  [phone]

## 2019-01-29 NOTE — Progress Notes (Addendum)
Progress Note   Subjective   Doing well today, the patient denies CP or SOB.  No new concerns  Inpatient Medications    Scheduled Meds: . aspirin EC  81 mg Oral Daily  . carvedilol  3.125 mg Oral BID WC  . clopidogrel  75 mg Oral Q breakfast  . heparin  5,000 Units Subcutaneous Q8H  . insulin aspart  0-20 Units Subcutaneous TID WC  . insulin aspart  0-5 Units Subcutaneous QHS  . insulin detemir  20 Units Subcutaneous QHS  . latanoprost  1 drop Both Eyes QHS  . levothyroxine  88 mcg Oral Q0600  . lisinopril  5 mg Oral Q breakfast  . LORazepam  2.5 mg Oral QHS  . pantoprazole  40 mg Oral QAC breakfast  . ranolazine  500 mg Oral BID  . rosuvastatin  20 mg Oral Q breakfast  . sertraline  25 mg Oral Daily  . sodium chloride flush  3 mL Intravenous Q12H  . sodium chloride flush  3 mL Intravenous Q12H   Continuous Infusions: . sodium chloride    . sodium chloride     PRN Meds: sodium chloride, sodium chloride, acetaminophen, ALPRAZolam, meclizine, nitroGLYCERIN, polyvinyl alcohol, sodium chloride flush, sodium chloride flush   Vital Signs    Vitals:   01/28/19 2005 01/29/19 0350 01/29/19 0445 01/29/19 0736  BP: 119/71 106/65  131/63  Pulse: 64 61  63  Resp: 20 20    Temp: 99.1 F (37.3 C) (!) 97.3 F (36.3 C)  97.6 F (36.4 C)  TempSrc: Oral Oral  Oral  SpO2: 96% 98%  97%  Weight:   59.1 kg   Height:        Intake/Output Summary (Last 24 hours) at 01/29/2019 1231 Last data filed at 01/29/2019 1148 Gross per 24 hour  Intake 3 ml  Output 1400 ml  Net -1397 ml   Filed Weights   01/28/19 0724 01/28/19 0946 01/29/19 0445  Weight: 61 kg 61 kg 59.1 kg    Telemetry    Sinus rhythm, no AF block or pauses, no ventricular arrhythmias - Personally Reviewed  Physical Exam   GEN- The patient is well appearing, alert and oriented x 3 today.   Head- normocephalic, atraumatic Eyes-  Sclera clear, conjunctiva pink Ears- hearing intact Oropharynx- clear Neck-  supple, Lungs- Clear to ausculation bilaterally, normal work of breathing Heart- Regular rate and rhythm  GI- soft, NT, ND, + BS Extremities- no clubbing, cyanosis, or edema    Labs    Chemistry Recent Labs  Lab 01/26/19 1406 01/28/19 0414 01/29/19 0547  NA 139 139 141  K 5.1 4.0 4.2  CL 106 105 109  CO2 24 23 23   GLUCOSE 239* 104* 156*  BUN 18 21 18   CREATININE 1.13* 1.33* 1.27*  CALCIUM 8.9 9.2 8.7*  PROT 7.0  --   --   ALBUMIN 4.1  --   --   AST 20  --   --   ALT 19  --   --   ALKPHOS 47  --   --   BILITOT 0.5  --   --   GFRNONAA 47* 38* 41*  GFRAA 54* 45* 47*  ANIONGAP 9 11 9      Hematology Recent Labs  Lab 01/27/19 0103 01/28/19 0415 01/29/19 0547  WBC 7.3 8.3 5.5  RBC 3.04* 3.44* 3.14*  HGB 10.0* 11.2* 10.3*  HCT 29.9* 33.0* 31.2*  MCV 98.4 95.9 99.4  MCH 32.9 32.6 32.8  MCHC 33.4 33.9 33.0  RDW 12.2 12.3 12.6  PLT 244 299 248    Cardiac EnzymesNo results for input(s): TROPONINI in the last 168 hours. No results for input(s): TROPIPOC in the last 168 hours.     Patient Profile     78 y.o. female with history of CAD status post four-vessel CABG in6/2014 status post PCI to the native proximal and mid RCA in 05/2013, diabetes, hypertension, hyperlipidemia, hypothyroidism, IBS, anxiety, dizziness/gait disturbance, vertigo depression, and GERD, admitted with dizziness, nausea/vomiting, and elevated troponin.  Assessment & Plan    1.  NSTEMI with profound EKG changes Ischemic CM Cath and echo reviewed Dr End has advised medical therapy No pain today Stop ranexa due to qt prolongation Follow-up closely with general cardiology as an outpatient  2. Prolonged qt New Stop ranexa Avoid QT prolonging medicines Keep K >3.9 and Mg > 1.9  3. ? Presyncope, "black out", pauses Discussed today.  She has worn telemetry here and outpatient ZIO without arrhythmias detected.  I am worried that her intermittent presyncope could be due to AV block, pauses, or  possibly torsades given her prolonged QT.  I would advise further monitoring with an implantable loop recorder.  This can be arranged through my office.  No driving in the interim.  Risks and benefits to ILR discussed with her today.  She would like to proceed.  Plans for discharge noted Follow-up with Dr Tyrell Antonio team in 1-2 weeks My office to arrange in office loop recorder placement.  Cardiology team to see as needed while here. Please call with questions.  Thompson Grayer MD, [facility] 01/29/2019 12:31 PM

## 2019-01-31 ENCOUNTER — Telehealth: Payer: Self-pay | Admitting: Cardiovascular Disease

## 2019-01-31 ENCOUNTER — Encounter: Payer: Self-pay | Admitting: Internal Medicine

## 2019-01-31 DIAGNOSIS — R42 Dizziness and giddiness: Secondary | ICD-10-CM | POA: Diagnosis not present

## 2019-01-31 DIAGNOSIS — Z7982 Long term (current) use of aspirin: Secondary | ICD-10-CM | POA: Diagnosis not present

## 2019-01-31 DIAGNOSIS — E039 Hypothyroidism, unspecified: Secondary | ICD-10-CM | POA: Diagnosis not present

## 2019-01-31 DIAGNOSIS — K219 Gastro-esophageal reflux disease without esophagitis: Secondary | ICD-10-CM | POA: Diagnosis not present

## 2019-01-31 DIAGNOSIS — I10 Essential (primary) hypertension: Secondary | ICD-10-CM | POA: Diagnosis not present

## 2019-01-31 DIAGNOSIS — I25110 Atherosclerotic heart disease of native coronary artery with unstable angina pectoris: Secondary | ICD-10-CM | POA: Diagnosis not present

## 2019-01-31 DIAGNOSIS — E119 Type 2 diabetes mellitus without complications: Secondary | ICD-10-CM | POA: Diagnosis not present

## 2019-01-31 DIAGNOSIS — E785 Hyperlipidemia, unspecified: Secondary | ICD-10-CM | POA: Diagnosis not present

## 2019-01-31 DIAGNOSIS — I4581 Long QT syndrome: Secondary | ICD-10-CM | POA: Diagnosis not present

## 2019-01-31 DIAGNOSIS — F329 Major depressive disorder, single episode, unspecified: Secondary | ICD-10-CM | POA: Diagnosis not present

## 2019-01-31 DIAGNOSIS — Z7902 Long term (current) use of antithrombotics/antiplatelets: Secondary | ICD-10-CM | POA: Diagnosis not present

## 2019-01-31 DIAGNOSIS — H409 Unspecified glaucoma: Secondary | ICD-10-CM | POA: Diagnosis not present

## 2019-01-31 DIAGNOSIS — Z794 Long term (current) use of insulin: Secondary | ICD-10-CM | POA: Diagnosis not present

## 2019-01-31 DIAGNOSIS — I252 Old myocardial infarction: Secondary | ICD-10-CM | POA: Diagnosis not present

## 2019-01-31 NOTE — Telephone Encounter (Signed)
Will route message to Dr. Fletcher Anon to review the cath and discuss with the MD that performed the procedure Dr. Saunders Revel and give his recommendation.

## 2019-01-31 NOTE — Telephone Encounter (Signed)
You can add her on Thursday of this week.

## 2019-01-31 NOTE — Telephone Encounter (Signed)
Patient daughter calling Patient has been in the hospital recently due to enzymes rising - had a cath and provider noticed that vessel has narrowed Wants Dr Tyrell Antonio input on whether she should have a stent Wants to schedule an appointment to see him ASAP but no current availability - requesting Dr Fletcher Anon only Please call to discuss

## 2019-02-01 ENCOUNTER — Telehealth: Payer: Self-pay

## 2019-02-01 NOTE — Telephone Encounter (Signed)

## 2019-02-01 NOTE — Telephone Encounter (Signed)
Spoke with the pt daughter Hilda Blades who is the pt POA. [name] aware that Dr. Fletcher Anon can see the pt for a work in appt on 02/03/19 @ 12pm. Hilda Blades made aware that they will need to enter through the medical mall entrance and wear a face covering. [name] voices appreciation for the assistance.

## 2019-02-02 DIAGNOSIS — E119 Type 2 diabetes mellitus without complications: Secondary | ICD-10-CM | POA: Diagnosis not present

## 2019-02-02 DIAGNOSIS — I25110 Atherosclerotic heart disease of native coronary artery with unstable angina pectoris: Secondary | ICD-10-CM | POA: Diagnosis not present

## 2019-02-02 DIAGNOSIS — R42 Dizziness and giddiness: Secondary | ICD-10-CM | POA: Diagnosis not present

## 2019-02-02 DIAGNOSIS — I10 Essential (primary) hypertension: Secondary | ICD-10-CM | POA: Diagnosis not present

## 2019-02-02 DIAGNOSIS — I252 Old myocardial infarction: Secondary | ICD-10-CM | POA: Diagnosis not present

## 2019-02-02 DIAGNOSIS — I4581 Long QT syndrome: Secondary | ICD-10-CM | POA: Diagnosis not present

## 2019-02-03 ENCOUNTER — Ambulatory Visit (INDEPENDENT_AMBULATORY_CARE_PROVIDER_SITE_OTHER): Payer: Medicare Other | Admitting: Cardiovascular Disease

## 2019-02-03 ENCOUNTER — Encounter: Payer: Self-pay | Admitting: Cardiovascular Disease

## 2019-02-03 ENCOUNTER — Other Ambulatory Visit: Payer: Self-pay

## 2019-02-03 VITALS — BP 130/60 | HR 62 | Temp 97.9°F | Ht 63.0 in | Wt 135.8 lb

## 2019-02-03 DIAGNOSIS — E785 Hyperlipidemia, unspecified: Secondary | ICD-10-CM

## 2019-02-03 DIAGNOSIS — I25118 Atherosclerotic heart disease of native coronary artery with other forms of angina pectoris: Secondary | ICD-10-CM

## 2019-02-03 DIAGNOSIS — I10 Essential (primary) hypertension: Secondary | ICD-10-CM

## 2019-02-03 NOTE — Patient Instructions (Addendum)
Medication Instructions:  Your physician recommends that you continue on your current medications as directed. Please refer to the Current Medication list given to you today.  If you need a refill on your cardiac medications before your next appointment, please call your pharmacy.   Lab work: None ordered If you have labs (blood work) drawn today and your tests are completely normal, you will receive your results only by: Marland Kitchen MyChart Message (if you have MyChart) OR . A paper copy in the mail If you have any lab test that is abnormal or we need to change your treatment, we will call you to review the results.  Testing/Procedures: None ordered  Follow-Up: At [facility], you and your health needs are our priority.  As part of our continuing mission to provide you with exceptional heart care, we have created designated Provider Care Teams.  These Care Teams include your primary Cardiologist (physician) and Advanced Practice Providers (APPs -  Physician Assistants and Nurse Practitioners) who all work together to provide you with the care you need, when you need it. You will need a follow up appointment in 4 weeks.    You may see  Dr.Arida or one of the following Advanced Practice Providers on your designated Care Team:   Murray Hodgkins, NP Christell Faith, PA-C . Marrianne Mood, PA-C

## 2019-02-03 NOTE — Progress Notes (Signed)
Cardiology Office Note   Date:  02/03/2019   ID:  Nivea, Wojdyla 28-Dec-1940, MRN [id number]  PCP:  Burnard Bunting, MD  Cardiologist:  Kathlyn Sacramento, MD   Chief Complaint  Patient presents with   other    Pt. would like to discuss recent cardiac cath. Meds reviewed by the pt. verbally.       History of Present Illness: JENNESSY SANDRIDGE is a 78 y.o. female who presents for a followup regarding coronary artery disease status post CABG and PCI.  She has known history of diabetes and hypertension. She had CABG in June, 2014 after NSTEMI.  She had recurrent angina post CABG. Cardiac cath in 05/2013 showed significant 3 vessel CAD with diffuse diabetic vessels. SVG to RPDA/OM3 was found to be occluded. OM3 got collaterals from RCA. EF was low normal. I performed Successful PCI and DES placement to proximal and mid right coronary artery. Left circumflex occlusion was left to be treated medically. Most recent nuclear stress test in January 2016 was normal. Most recent echocardiogram in August 2016 showed normal LV systolic function, mild to moderate mitral regurgitation and mild pulmonary hypertension.   She was seen in March for dizziness and syncope after cataract surgery.  This was felt to be noncardiac.  We did do a 2-week monitor that showed no evidence of arrhythmia.  She had syncope while she was wearing the monitor and she was noted to be in normal sinus rhythm at that time. The patient was hospitalized recently at [facility] after she presented with weakness thought to be due to hypoglycemia.  She also had some vomiting.  She did not have chest pain.  She came to the ED and was found to have elevated high-sensitivity troponin at 245 with subsequently increased to 769.  Echocardiogram showed an EF of 45 to 50%.  Cardiac catheterization was done via the left radial artery which showed significant underlying three-vessel coronary artery disease with patent LIMA to LAD, patent SVG to diagonal  with 50% stenosis in the distal anastomosis, chronically occluded SVG to the right PDA and OM3.  The only difference from prior catheterization was progression of ostial left circumflex stenosis. She reports decline in her functional capacity over the last 6 months with fatigue and poor balance.  She has no energy to do anything.  She did report improvement previously with physical therapy at home.  Her husband's Lewy body dementia is getting worse and that has been very stressful. She has occasional chest pain but not very frequently.  She has not had to use sublingual nitroglycerin.  Past Medical History:  Diagnosis Date   Anemia    "after OHS in 01/2013" (05/25/2013)   Anginal pain ([place])    Anxiety    Arthritis    "joints" (05/25/2013)   At risk for falls    Coronary artery disease    a. nl myoview ~ 2006;  b. 01/2013 s/p MI and CABG x 4 (VG->D2, VG->RPDA->OM3, LIMA->LAD;  c. 05/2013 Cath/PCI: LM nl, LAD 60-70p/m, D1 70p, LCX 177m, OM2 60p, RCA 50-60p/70m (3.0x33 Xience DES), VG->D2 60, VG->RPDA/OM3 100, LIMA->LAD nl w 70 dLAD, EF 50%.   Depression    Diabetes mellitus, type 2 ([place])    "dx'd 1992" (05/25/2013)   Dyspnea    DOE   Gait disturbance    uses walker some   GERD (gastroesophageal reflux disease)    Glaucoma    bilateral   Headache(784.0)    "monthly" (05/25/2013)  History of hiatal hernia    History of pneumonia 1985   Hyperlipidemia    Hypertension    Hypothyroidism    IBS (irritable bowel syndrome)    Lymphocytosis 03/24/2012   Myocardial infarction ([place])    2014   OA (osteoarthritis)    PONV (postoperative nausea and vomiting)    Vertigo     Past Surgical History:  Procedure Laterality Date   BLADDER SUSPENSION  2005   CARDIAC CATHETERIZATION  01/2013   CARPAL TUNNEL RELEASE Bilateral 1990-1992   CATARACT EXTRACTION W/PHACO Left 08/17/2018   Procedure: CATARACT EXTRACTION PHACO AND INTRAOCULAR LENS PLACEMENT ([place]) LEFT, DIABETIC;   Surgeon: Birder Robson, MD;  Location: [facility] ORS;  Service: Ophthalmology;  Laterality: Left;  [place] 01:01 CDE 8.51 Fluid pack lot # [id number]   CATARACT EXTRACTION W/PHACO Right 09/14/2018   Procedure: CATARACT EXTRACTION PHACO AND INTRAOCULAR LENS PLACEMENT (I[facility]) RIGHT, DIABETIC;  Surgeon: Birder Robson, MD;  Location: [facility] OR[facility];  Service: Ophthalmology;  Laterality: Right;  [place]  01:39 CDE 15.79 Fluid pack lot # [id number]   COLONOSCOPY WITH PROPOFOL N/A 11/20/2015   Procedure: COLONOSCOPY WITH PROPOFOL;  Surgeon: Lucilla Lame, MD;  Location: [facility] ENDOSCOPY;  Service: Endoscopy;  Laterality: N/A;   CORONARY ANGIOPLASTY WITH STENT PLACEMENT  05/25/2013   "1" (05/25/2013)   CORONARY ARTERY BYPASS GRAFT N/A 01/07/2013   Procedure: CORONARY ARTERY BYPASS GRAFTING (CABG);  Surgeon: Melrose Nakayama, MD;  Location: [place];  Service: Open Heart Surgery;  Laterality: N/A;   ESOPHAGOGASTRODUODENOSCOPY (EGD) WITH PROPOFOL N/A 11/20/2015   Procedure: ESOPHAGOGASTRODUODENOSCOPY (EGD) WITH PROPOFOL;  Surgeon: Lucilla Lame, MD;  Location: [facility];  Service: Endoscopy;  Laterality: N/A;   HAMMER TOE SURGERY Right 2009   KNEE ARTHROSCOPY Bilateral 1993-1996   LEFT HEART CATH AND CORS/GRAFTS ANGIOGRAPHY N/A 01/28/2019   Procedure: LEFT HEART CATH AND CORS/GRAFTS ANGIOGRAPHY;  Surgeon: Nelva Bush, MD;  Location: [facility];  Service: Cardiovascular;  Laterality: N/A;   LEFT HEART CATHETERIZATION WITH CORONARY ANGIOGRAM N/A 01/05/2013   Procedure: LEFT HEART CATHETERIZATION WITH CORONARY ANGIOGRAM;  Surgeon: Wellington Hampshire, MD;  Location: [place] C[facility];  Service: Cardiovascular;  Laterality: N/A;   LEFT HEART CATHETERIZATION WITH CORONARY/GRAFT ANGIOGRAM N/A 05/25/2013   Procedure: LEFT HEART CATHETERIZATION WITH Beatrix Fetters;  Surgeon: Wellington Hampshire, MD;  Location: [place] CATH LAB;  Service: Cardiovascular;  Laterality: N/A;   PERCUTANEOUS CORONARY STENT INTERVENTION  (PCI-S)  05/25/2013   Procedure: PERCUTANEOUS CORONARY STENT INTERVENTION (PCI-S);  Surgeon: Wellington Hampshire, MD;  Location: [facility] CATH LAB;  Service: Cardiovascular;;   THYROIDECTOMY  2010   VAGINAL HYSTERECTOMY  1974     Current Outpatient Medications  Medication Sig Dispense Refill   acetaminophen (TYLENOL ARTHRITIS PAIN) 650 MG CR tablet Take 650 mg by mouth at bedtime.      aspirin 81 MG tablet Take 1 tablet (81 mg total) by mouth daily.     B-D INS SYRINGE 0.5CC/31GX5/16 31G X 5/16" 0.5 ML MISC daily. as directed  5   B-D ULTRAFINE III SHORT PEN 31G X 8 MM MISC daily. as directed  5   Carboxymethylcellulose Sodium (THERATEARS) 0.25 % SOLN Place 1 drop into both eyes 3 (three) times daily as needed (for dry/irritated eyes.).     carvedilol (COREG) 3.125 MG tablet Take 1 tablet (3.125 mg total) by mouth 2 (two) times daily with a meal. 60 tablet 0   clopidogrel (PLAVIX) 75 MG tablet Take 1 tablet (75 mg total) by mouth daily with  breakfast. 30 tablet 0   latanoprost (XALATAN) 0.005 % ophthalmic solution Place 1 drop into both eyes at bedtime.      LEVEMIR 100 UNIT/ML injection Inject 20 Units into the skin at bedtime. Sliding scale  3   levothyroxine (SYNTHROID, LEVOTHROID) 88 MCG tablet Take 88 mcg by mouth daily before breakfast.     lisinopril (ZESTRIL) 5 MG tablet TAKE 1 TABLET BY MOUTH EVERY DAY 90 tablet 0   LORazepam (ATIVAN) 1 MG tablet Take 2.5 mg by mouth at bedtime.      meclizine (ANTIVERT) 25 MG tablet Take 25 mg by mouth daily.     nitroGLYCERIN (NITROSTAT) 0.4 MG SL tablet PLACE 1 TABLET (0.4 MG TOTAL) UNDER THE TONGUE EVERY 5 (FIVE) MINUTES AS NEEDED FOR CHEST PAIN. 25 tablet 0   ondansetron (ZOFRAN ODT) 4 MG disintegrating tablet Take 1 tablet (4 mg total) by mouth every 8 (eight) hours as needed for nausea or vomiting. 15 tablet 0   ONE TOUCH ULTRA TEST test strip USE AS DIRECTED TO TEST BLOOD SUGAR 3 TIMES A DAY DX E11.51  3   pantoprazole (PROTONIX)  40 MG tablet Take 40 mg by mouth daily before breakfast.     rosuvastatin (CRESTOR) 5 MG tablet Take 1 tablet (5 mg total) by mouth daily with breakfast. 90 tablet 0   sertraline (ZOLOFT) 25 MG tablet Take 25 mg by mouth daily.     No current facility-administered medications for this visit.     Allergies:   Amitriptyline, Cortisone, Lipitor [atorvastatin], Macrodantin [nitrofurantoin], and Motrin [ibuprofen]    Social History:  The patient  reports that she has never smoked. She has never used smokeless tobacco. She reports that she does not drink alcohol or use drugs.   Family History:  The patient's family history includes Cancer in her mother; Diabetes in her son; Emphysema in her brother and father.    ROS:  Please see the history of present illness.   Otherwise, review of systems are positive for none.   All other systems are reviewed and negative.    PHYSICAL EXAM: VS:  BP 130/60 (BP Location: Left Arm, Patient Position: Sitting, Cuff Size: Normal)    Pulse 62    Temp 97.9 F (36.6 C)    Ht 5\' 3"  (1.6 m)    Wt 135 lb 12 oz (61.6 kg)    SpO2 98%    BMI 24.05 kg/m  , BMI Body mass index is 24.05 kg/m. GEN: Well nourished, well developed, in no acute distress  HEENT: normal  Neck: no JVD, carotid bruits, or masses Cardiac: RRR; no murmurs, rubs, or gallops,no edema  Respiratory:  clear to auscultation bilaterally, normal work of breathing GI: soft, nontender, nondistended, + BS MS: no deformity or atrophy  Skin: warm and dry, no rash Neuro:  Strength and sensation are intact Psych: euthymic mood, full affect Left radial pulses normal with no hematoma.  There is ecchymosis.  EKG:  EKG is ordered today. The ekg ordered today demonstrates normal sinus rhythm anterior T wave changes suggestive of ischemia.   Recent Labs: 01/26/2019: ALT 19; TSH 1.648 01/29/2019: BUN 18; Creatinine, Ser 1.27; Hemoglobin 10.3; Platelets 248; Potassium 4.2; Sodium 141    Lipid Panel      Component Value Date/Time   CHOL 164 06/24/2013 1031   TRIG 143 06/24/2013 1031   HDL 49 06/24/2013 1031   CHOLHDL 3.3 06/24/2013 1031   CHOLHDL 6.2 01/06/2013 0245   VLDL 28 01/06/2013 0245  LDLCALC 86 06/24/2013 1031      Wt Readings from Last 3 Encounters:  02/03/19 135 lb 12 oz (61.6 kg)  01/29/19 130 lb 3.2 oz (59.1 kg)  10/14/18 133 lb 4 oz (60.4 kg)        ASSESSMENT AND PLAN:  1.  Coronary artery disease involving bypass graft with possible recent unstable angina: I am not convinced that her current symptoms of fatigue and gradual decline in functional capacity over the last 6 months is related to her underlying coronary artery disease.  Fluctuation in her blood sugar might be contributing.  She also might be dealing with significant depression related to her husband's memory decline. I explained to her that PCI of the left circumflex is doable but I am not certain how much benefit she is going to have from that especially that the mid left circumflex is chronically occluded.  In addition, the patient is not exerting herself enough to gauge whether she is having angina or not.  For now I favor continued medical therapy.  Continue Plavix for now. I advised her to follow-up with her primary care physician to address all other noncardiac issues. Reevaluate in 1 month to see if left circumflex PCI is needed.  2. Essential hypertension: Blood pressure is controlled on current medications.    3. Hyperlipidemia with intolerance to statins. She has been able to tolerate small dose rosuvastatin .   4.  Poor balance and dizziness: Consider neurology evaluation.  Disposition:   FU with me in 1 months   Signed, Kathlyn Sacramento, MD 02/03/19 [place], [place]

## 2019-02-07 ENCOUNTER — Other Ambulatory Visit: Payer: Self-pay | Admitting: *Deleted

## 2019-02-07 NOTE — Patient Outreach (Addendum)
[facility]) Care Management  02/07/2019  Angela Barrett Feb 09, 1941 [id number]   EMMI-general discharge, after d/c from [place] Day # 4 Date: 02/03/19 1302  Red Alert Reason: Lost interest in things? Yes  Insurance: medicare  [facility] admissions x 1  ED visits x 1 in the last 6 months    Outreach attempt # 1  Patient is able to verify HIPAA [facility] Care Management RN reviewed and addressed red alert with patient   EMMI:  Angela Barrett confirms the answer to the EMMI question is correct and is related to her being "disappointed"  She reports to [facility] RN CM that she does not feel any better than she did prior to going into [facility]. She reports she was treated well and she had a good stay but is not satisfied with no resolution to her s/s She states she is still dizzy even with the use of her walker and can only sit around most of the day and walk small distances.   Her daughter, Hilda Blades is staying with her and her husband who has medical concerns also. Her husband is being followed by hospice. Her daughter is doing all iADLs (cooking, cleaning, errands, bills, etc) Angela Barrett reports this has been going on for 6 months  [facility] RN CM discussed and offered [facility] SW for possible personal care services. Angela Barrett states she would not be able to afford the out of pocket cost. She reports the hospice staff checks with her also plus she has her son to assist. He stays about 2 days out of the week. Some of the family are on vacation at this time but will assist upon their return  A cardiologist was seen after the stay at [facility] who states she has "a place in the back of my heart that is sixty percent occluded" She goes back to see the Cardiologist in a month to have 2 stents placed   Dr Jacquiline Doe office staff Dr Ria Comment is scheduled to call her tomorrow to do a telehealth/teleconference call  She is to go in to the primary care MD office on 02/23/19    She reports her BP was 130/70 last  night and her pulse was 68 She reports being frustrated when being told her vital signs are normal but she does "not feel good. I know there is something wrong"   She reports also that her cbg value is elevated She states on last night the cbg value was 425  She informed CM she is not eating much She reports eating cereal for breakfast, a sandwich for lunch and then soup for her dinner with jello Her daughter Hilda Blades is also available and confirms her appetite is fair but she is receiving a good fluid intake  CM discussed various reasons cbg elevations occur  Fell 8 times in the last 8 months She states the falls have been related to dizziness with looking up or down. She informs CM she keeps her walker with her or near at all times  [facility] RN CM discussed and offered assist for a bedside commode but Angela Barrett prefers is to get up to walk "the short distance" to her restroom.   She reports also lots of headaches that is relieved with Tylenol or Excedrin it the Tylenol does not resolved the headache "only if it does not ease off"   She denies need for [facility] SW referral   Social: Angela Barrett is married and lives with her husband  who has medical issues of his own. She has support of her family. Her daughter and son assist in her and her husband's care She is independent to assist with all her care. Her family provides transportation to medical appointments.    Conditions: NSTEMI, HTN, CAD, unstable angina, PAD, GERD, noninfectious diarrhea, DM, hypothyroidism s/p CABG x 4, leg edema, palpitations, bilateral leg pain, HLD, hypothyroidism, problems with swallowing    Falls She reports 8 falls in the last 6 months related to dizziness and not being able to "look up or down" She confirms use of her walker for ambulation to attempt to prevent falls   DME: walker, eyeglasses, cbg monitor  Medications: She denies concerns with taking medications as prescribed, affording medications, side effects of medications  and questions about medications  Appointments: 02/08/19 primary MD teleconference/telehealth call  03/24/19 Dr Fletcher Anon Cardiology  Advance Directives: She has a POA and living will   Consent: [facility] RN CM reviewed [facility] services with patient. Patient gave verbal consent for services [facility] telephonic RN CM.   Advised patient that there will be further automated EMMI- post discharge calls to assess how the patient is doing following the recent hospitalization Advised the patient that another call may be received from a nurse if any of their responses were abnormal. Patient voiced understanding and was appreciative of f/u call.   Plan: [facility] RN CM will close case at this time as patient has been assessed and no needs identified/needs resolved.   Pt encouraged to return a call to [place] CM prn  [facility] RN CM sent a successful outreach letter as discussed with [facility] brochure enclosed for review  Routed note to MD  [place]. Lavina Hamman, RN, BSN, [place] Coordinator Office number [phone] Mobile number [phone]  Main [facility] number [phone] Fax number [phone]

## 2019-02-08 ENCOUNTER — Encounter: Payer: Self-pay | Admitting: *Deleted

## 2019-02-08 DIAGNOSIS — I129 Hypertensive chronic kidney disease with stage 1 through stage 4 chronic kidney disease, or unspecified chronic kidney disease: Secondary | ICD-10-CM | POA: Diagnosis not present

## 2019-02-08 DIAGNOSIS — R42 Dizziness and giddiness: Secondary | ICD-10-CM | POA: Diagnosis not present

## 2019-02-08 DIAGNOSIS — I4581 Long QT syndrome: Secondary | ICD-10-CM | POA: Diagnosis not present

## 2019-02-08 DIAGNOSIS — F329 Major depressive disorder, single episode, unspecified: Secondary | ICD-10-CM | POA: Diagnosis not present

## 2019-02-08 DIAGNOSIS — E1151 Type 2 diabetes mellitus with diabetic peripheral angiopathy without gangrene: Secondary | ICD-10-CM | POA: Diagnosis not present

## 2019-02-08 DIAGNOSIS — H409 Unspecified glaucoma: Secondary | ICD-10-CM | POA: Diagnosis not present

## 2019-02-08 DIAGNOSIS — E785 Hyperlipidemia, unspecified: Secondary | ICD-10-CM | POA: Diagnosis not present

## 2019-02-08 DIAGNOSIS — R7989 Other specified abnormal findings of blood chemistry: Secondary | ICD-10-CM | POA: Diagnosis not present

## 2019-02-08 DIAGNOSIS — I2510 Atherosclerotic heart disease of native coronary artery without angina pectoris: Secondary | ICD-10-CM | POA: Diagnosis not present

## 2019-02-08 DIAGNOSIS — N183 Chronic kidney disease, stage 3 (moderate): Secondary | ICD-10-CM | POA: Diagnosis not present

## 2019-02-08 DIAGNOSIS — K219 Gastro-esophageal reflux disease without esophagitis: Secondary | ICD-10-CM | POA: Diagnosis not present

## 2019-02-09 ENCOUNTER — Other Ambulatory Visit: Payer: Self-pay | Admitting: Internal Medicine

## 2019-02-09 DIAGNOSIS — R55 Syncope and collapse: Secondary | ICD-10-CM

## 2019-02-10 ENCOUNTER — Telehealth: Payer: Self-pay

## 2019-02-10 NOTE — Telephone Encounter (Signed)
Call placed to Pt to schedule loop implant per Dr. Rayann Heman.  Pt states she is not interested in a loop implant at this time because she might need more stents in her heart.    She asked this nurse to call her in a month and check back.  Will call Pt in August to check back in.

## 2019-02-18 ENCOUNTER — Other Ambulatory Visit: Payer: Self-pay

## 2019-02-18 ENCOUNTER — Ambulatory Visit
Admission: RE | Admit: 2019-02-18 | Discharge: 2019-02-18 | Disposition: A | Discharge status: Home / Self Care | Payer: Medicare Other | Source: Ambulatory Visit | Attending: Internal Medicine | Admitting: Internal Medicine

## 2019-02-18 DIAGNOSIS — R55 Syncope and collapse: Secondary | ICD-10-CM | POA: Diagnosis not present

## 2019-02-18 DIAGNOSIS — I6523 Occlusion and stenosis of bilateral carotid arteries: Secondary | ICD-10-CM | POA: Diagnosis not present

## 2019-02-23 DIAGNOSIS — I2510 Atherosclerotic heart disease of native coronary artery without angina pectoris: Secondary | ICD-10-CM | POA: Diagnosis not present

## 2019-02-23 DIAGNOSIS — E1151 Type 2 diabetes mellitus with diabetic peripheral angiopathy without gangrene: Secondary | ICD-10-CM | POA: Diagnosis not present

## 2019-02-23 DIAGNOSIS — R7989 Other specified abnormal findings of blood chemistry: Secondary | ICD-10-CM | POA: Diagnosis not present

## 2019-02-23 DIAGNOSIS — I4581 Long QT syndrome: Secondary | ICD-10-CM | POA: Diagnosis not present

## 2019-02-23 DIAGNOSIS — E785 Hyperlipidemia, unspecified: Secondary | ICD-10-CM | POA: Diagnosis not present

## 2019-02-23 DIAGNOSIS — Z794 Long term (current) use of insulin: Secondary | ICD-10-CM | POA: Diagnosis not present

## 2019-02-23 DIAGNOSIS — I129 Hypertensive chronic kidney disease with stage 1 through stage 4 chronic kidney disease, or unspecified chronic kidney disease: Secondary | ICD-10-CM | POA: Diagnosis not present

## 2019-02-23 DIAGNOSIS — N183 Chronic kidney disease, stage 3 (moderate): Secondary | ICD-10-CM | POA: Diagnosis not present

## 2019-02-23 DIAGNOSIS — E1129 Type 2 diabetes mellitus with other diabetic kidney complication: Secondary | ICD-10-CM | POA: Diagnosis not present

## 2019-02-23 DIAGNOSIS — I209 Angina pectoris, unspecified: Secondary | ICD-10-CM | POA: Diagnosis not present

## 2019-02-23 DIAGNOSIS — H409 Unspecified glaucoma: Secondary | ICD-10-CM | POA: Diagnosis not present

## 2019-02-23 DIAGNOSIS — I700 Atherosclerosis of aorta: Secondary | ICD-10-CM | POA: Diagnosis not present

## 2019-02-24 ENCOUNTER — Other Ambulatory Visit: Payer: Self-pay | Admitting: Cardiovascular Disease

## 2019-02-24 MED ORDER — CLOPIDOGREL BISULFATE 75 MG PO TABS: 75.0000 mg | ORAL_TABLET | Freq: Every day | ORAL | 1 refills | Status: DC

## 2019-02-24 NOTE — Telephone Encounter (Signed)
°*  STAT* If patient is at the pharmacy, call can be transferred to refill team.   1. Which medications need to be refilled? (please list name of each medication and dose if known)   Plavix 75 mg po q d   2. Which pharmacy/location (including street and city if local pharmacy) is medication to be sent to?   [facility] [address]     3. Do they need a 30 day or 90 day supply? [place]

## 2019-02-25 ENCOUNTER — Other Ambulatory Visit: Payer: Self-pay | Admitting: Cardiovascular Disease

## 2019-03-15 DIAGNOSIS — R42 Dizziness and giddiness: Secondary | ICD-10-CM | POA: Diagnosis not present

## 2019-03-15 DIAGNOSIS — R55 Syncope and collapse: Secondary | ICD-10-CM | POA: Diagnosis not present

## 2019-03-21 ENCOUNTER — Other Ambulatory Visit: Payer: Self-pay | Admitting: Cardiovascular Disease

## 2019-03-23 ENCOUNTER — Encounter: Payer: Medicare Other | Admitting: Internal Medicine

## 2019-03-24 ENCOUNTER — Ambulatory Visit: Payer: Medicare Other | Admitting: Cardiovascular Disease

## 2019-03-24 NOTE — Progress Notes (Deleted)
Cardiology Office Note   Date:  03/24/2019   ID:  Angela Barrett, Angela Barrett 1940-08-18, MRN [id number]  PCP:  Burnard Bunting, MD  Cardiologist:  Kathlyn Sacramento, MD   No chief complaint on file.     History of Present Illness: Angela Barrett is a 78 y.o. female who presents for a followup regarding coronary artery disease status post CABG and PCI.  She has known history of diabetes and hypertension. She had CABG in June, 2014 after NSTEMI.  She had recurrent angina post CABG. Cardiac cath in 05/2013 showed significant 3 vessel CAD with diffuse diabetic vessels. SVG to RPDA/OM3 was found to be occluded. OM3 got collaterals from RCA. EF was low normal. I performed Successful PCI and DES placement to proximal and mid right coronary artery. Left circumflex occlusion was left to be treated medically. Most recent nuclear stress test in January 2016 was normal. Most recent echocardiogram in August 2016 showed normal LV systolic function, mild to moderate mitral regurgitation and mild pulmonary hypertension.   She was seen in March for dizziness and syncope after cataract surgery.  This was felt to be noncardiac.  We did do a 2-week monitor that showed no evidence of arrhythmia.  She had syncope while she was wearing the monitor and she was noted to be in normal sinus rhythm at that time. The patient was hospitalized recently at [facility] after she presented with weakness thought to be due to hypoglycemia.  She also had some vomiting.  She did not have chest pain.  She came to the ED and was found to have elevated high-sensitivity troponin at 245 with subsequently increased to 769.  Echocardiogram showed an EF of 45 to 50%.  Cardiac catheterization was done via the left radial artery which showed significant underlying three-vessel coronary artery disease with patent LIMA to LAD, patent SVG to diagonal with 50% stenosis in the distal anastomosis, chronically occluded SVG to the right PDA and OM3.  The only  difference from prior catheterization was progression of ostial left circumflex stenosis. She reports decline in her functional capacity over the last 6 months with fatigue and poor balance.  She has no energy to do anything.  She did report improvement previously with physical therapy at home.  Her husband's Lewy body dementia is getting worse and that has been very stressful. She has occasional chest pain but not very frequently.  She has not had to use sublingual nitroglycerin.  Past Medical History:  Diagnosis Date   Anemia    "after OHS in 01/2013" (05/25/2013)   Anginal pain ([place])    Anxiety    Arthritis    "joints" (05/25/2013)   At risk for falls    Coronary artery disease    a. nl myoview ~ 2006;  b. 01/2013 s/p MI and CABG x 4 (VG->D2, VG->RPDA->OM3, LIMA->LAD;  c. 05/2013 Cath/PCI: LM nl, LAD 60-70p/m, D1 70p, LCX 166m, OM2 60p, RCA 50-60p/36m (3.0x33 Xience DES), VG->D2 60, VG->RPDA/OM3 100, LIMA->LAD nl w 70 dLAD, EF 50%.   Depression    Diabetes mellitus, type 2 ([place])    "dx'd 1992" (05/25/2013)   Dyspnea    DOE   Gait disturbance    uses walker some   GERD (gastroesophageal reflux disease)    Glaucoma    bilateral   Headache(784.0)    "monthly" (05/25/2013)   History of hiatal hernia    History of pneumonia 1985   Hyperlipidemia    Hypertension    Hypothyroidism  IBS (irritable bowel syndrome)    Lymphocytosis 03/24/2012   Myocardial infarction ([place])    2014   OA (osteoarthritis)    PONV (postoperative nausea and vomiting)    Vertigo     Past Surgical History:  Procedure Laterality Date   BLADDER SUSPENSION  2005   CARDIAC CATHETERIZATION  01/2013   CARPAL TUNNEL RELEASE Bilateral 1990-1992   CATARACT EXTRACTION W/PHACO Left 08/17/2018   Procedure: CATARACT EXTRACTION PHACO AND INTRAOCULAR LENS PLACEMENT (IOC) LEFT, DIABETIC;  Surgeon: Birder Robson, MD;  Location: [facility];  Service: Ophthalmology;  Laterality: Left;  [place]  01:01 CDE 8.51 Fluid pack lot # [id number]   CATARACT EXTRACTION W/PHACO Right 09/14/2018   Procedure: CATARACT EXTRACTION PHACO AND INTRAOCULAR LENS PLACEMENT (IOC) RIGHT, DIABETIC;  Surgeon: Birder Robson, MD;  Location: [facility];  Service: Ophthalmology;  Laterality: Right;  [place]  01:39 CDE 15.79 Fluid pack lot # [id number]   COLONOSCOPY WITH PROPOFOL N/A 11/20/2015   Procedure: COLONOSCOPY WITH PROPOFOL;  Surgeon: Lucilla Lame, MD;  Location: [facility];  Service: Endoscopy;  Laterality: N/A;   CORONARY ANGIOPLASTY WITH STENT PLACEMENT  05/25/2013   "1" (05/25/2013)   CORONARY ARTERY BYPASS GRAFT N/A 01/07/2013   Procedure: CORONARY ARTERY BYPASS GRAFTING (CABG);  Surgeon: Melrose Nakayama, MD;  Location: [place];  Service: Open Heart Surgery;  Laterality: N/A;   ESOPHAGOGASTRODUODENOSCOPY (EGD) WITH PROPOFOL N/A 11/20/2015   Procedure: ESOPHAGOGASTRODUODENOSCOPY (EGD) WITH PROPOFOL;  Surgeon: Lucilla Lame, MD;  Location: [facility];  Service: Endoscopy;  Laterality: N/A;   HAMMER TOE SURGERY Right 2009   KNEE ARTHROSCOPY Bilateral 1993-1996   LEFT HEART CATH AND CORS/GRAFTS ANGIOGRAPHY N/A 01/28/2019   Procedure: LEFT HEART CATH AND CORS/GRAFTS ANGIOGRAPHY;  Surgeon: Nelva Bush, MD;  Location: [place] CV LAB;  Service: Cardiovascular;  Laterality: N/A;   LEFT HEART CATHETERIZATION WITH CORONARY ANGIOGRAM N/A 01/05/2013   Procedure: LEFT HEART CATHETERIZATION WITH CORONARY ANGIOGRAM;  Surgeon: Wellington Hampshire, MD;  Location: [place] CATH LAB;  Service: Cardiovascular;  Laterality: N/A;   LEFT HEART CATHETERIZATION WITH CORONARY/GRAFT ANGIOGRAM N/A 05/25/2013   Procedure: LEFT HEART CATHETERIZATION WITH Beatrix Fetters;  Surgeon: Wellington Hampshire, MD;  Location: [place] CATH LAB;  Service: Cardiovascular;  Laterality: N/A;   PERCUTANEOUS CORONARY STENT INTERVENTION (PCI-S)  05/25/2013   Procedure: PERCUTANEOUS CORONARY STENT INTERVENTION (PCI-S);  Surgeon: Wellington Hampshire, MD;  Location: [facility] C[facility] LAB;  Service: Cardiovascular;;   THYROIDECTOMY  2010   VAGINAL HYSTERECTOMY  1974     Current Outpatient Medications  Medication Sig Dispense Refill   acetaminophen (TYLENOL ARTHRITIS PAIN) 650 MG CR tablet Take 650 mg by mouth at bedtime.      aspirin 81 MG tablet Take 1 tablet (81 mg total) by mouth daily.     B-D INS SYRINGE 0.5CC/31GX5/16 31G X 5/16" 0.5 ML MISC daily. as directed  5   B-D ULTRAFINE III SHORT PEN 31G X 8 MM MISC daily. as directed  5   Carboxymethylcellulose Sodium (THERATEARS) 0.25 % SOLN Place 1 drop into both eyes 3 (three) times daily as needed (for dry/irritated eyes.).     carvedilol (COREG) 3.125 MG tablet Take 1 tablet (3.125 mg total) by mouth 2 (two) times daily with a meal. 60 tablet 0   clopidogrel (PLAVIX) 75 MG tablet Take 1 tablet (75 mg total) by mouth daily with breakfast. 30 tablet 1   latanoprost (XALATAN) 0.005 % ophthalmic solution Place 1 drop into both eyes at bedtime.  LEVEMIR 100 UNIT/ML injection Inject 20 Units into the skin at bedtime. Sliding scale  3   levothyroxine (SYNTHROID, LEVOTHROID) 88 MCG tablet Take 88 mcg by mouth daily before breakfast.     lisinopril (ZESTRIL) 5 MG tablet TAKE 1 TABLET BY MOUTH EVERY DAY 90 tablet 0   LORazepam (ATIVAN) 1 MG tablet Take 2.5 mg by mouth at bedtime.      meclizine (ANTIVERT) 25 MG tablet Take 25 mg by mouth daily.     nitroGLYCERIN (NITROSTAT) 0.4 MG SL tablet PLACE 1 TABLET (0.4 MG TOTAL) UNDER THE TONGUE EVERY 5 (FIVE) MINUTES AS NEEDED FOR CHEST PAIN. 25 tablet 0   ondansetron (ZOFRAN ODT) 4 MG disintegrating tablet Take 1 tablet (4 mg total) by mouth every 8 (eight) hours as needed for nausea or vomiting. 15 tablet 0   ONE TOUCH ULTRA TEST test strip USE AS DIRECTED TO TEST BLOOD SUGAR 3 TIMES A DAY DX E11.51  3   pantoprazole (PROTONIX) 40 MG tablet Take 40 mg by mouth daily before breakfast.     rosuvastatin (CRESTOR) 5 MG tablet Take 1  tablet (5 mg total) by mouth daily with breakfast. 90 tablet 0   sertraline (ZOLOFT) 25 MG tablet Take 25 mg by mouth daily.     No current facility-administered medications for this visit.     Allergies:   Amitriptyline, Cortisone, Lipitor [atorvastatin], Macrodantin [nitrofurantoin], and Motrin [ibuprofen]    Social History:  The patient  reports that she has never smoked. She has never used smokeless tobacco. She reports that she does not drink alcohol or use drugs.   Family History:  The patient's family history includes Cancer in her mother; Diabetes in her son; Emphysema in her brother and father.    ROS:  Please see the history of present illness.   Otherwise, review of systems are positive for none.   All other systems are reviewed and negative.    PHYSICAL EXAM: VS:  There were no vitals taken for this visit. , BMI There is no height or weight on file to calculate BMI. GEN: Well nourished, well developed, in no acute distress  HEENT: normal  Neck: no JVD, carotid bruits, or masses Cardiac: RRR; no murmurs, rubs, or gallops,no edema  Respiratory:  clear to auscultation bilaterally, normal work of breathing GI: soft, nontender, nondistended, + BS MS: no deformity or atrophy  Skin: warm and dry, no rash Neuro:  Strength and sensation are intact Psych: euthymic mood, full affect Left radial pulses normal with no hematoma.  There is ecchymosis.  EKG:  EKG is ordered today. The ekg ordered today demonstrates normal sinus rhythm anterior T wave changes suggestive of ischemia.   Recent Labs: 01/26/2019: ALT 19; TSH 1.648 01/29/2019: BUN 18; Creatinine, Ser 1.27; Hemoglobin 10.3; Platelets 248; Potassium 4.2; Sodium 141    Lipid Panel    Component Value Date/Time   CHOL 164 06/24/2013 1031   TRIG 143 06/24/2013 1031   HDL 49 06/24/2013 1031   CHOLHDL 3.3 06/24/2013 1031   CHOLHDL 6.2 01/06/2013 0245   VLDL 28 01/06/2013 0245   LDLCALC 86 06/24/2013 1031      Wt  Readings from Last 3 Encounters:  02/03/19 135 lb 12 oz (61.6 kg)  01/29/19 130 lb 3.2 oz (59.1 kg)  10/14/18 133 lb 4 oz (60.4 kg)        ASSESSMENT AND PLAN:  1.  Coronary artery disease involving bypass graft with possible recent unstable angina: I am not  convinced that her current symptoms of fatigue and gradual decline in functional capacity over the last 6 months is related to her underlying coronary artery disease.  Fluctuation in her blood sugar might be contributing.  She also might be dealing with significant depression related to her husband's memory decline. I explained to her that PCI of the left circumflex is doable but I am not certain how much benefit she is going to have from that especially that the mid left circumflex is chronically occluded.  In addition, the patient is not exerting herself enough to gauge whether she is having angina or not.  For now I favor continued medical therapy.  Continue Plavix for now. I advised her to follow-up with her primary care physician to address all other noncardiac issues. Reevaluate in 1 month to see if left circumflex PCI is needed.  2. Essential hypertension: Blood pressure is controlled on current medications.    3. Hyperlipidemia with intolerance to statins. She has been able to tolerate small dose rosuvastatin .   4.  Poor balance and dizziness: Consider neurology evaluation.  Disposition:   FU with me in 1 months   Signed, Kathlyn Sacramento, MD 03/24/19 [place], [place]

## 2019-03-31 ENCOUNTER — Other Ambulatory Visit: Payer: Self-pay | Admitting: *Deleted

## 2019-03-31 ENCOUNTER — Telehealth: Payer: Self-pay | Admitting: Cardiovascular Disease

## 2019-03-31 MED ORDER — CLOPIDOGREL BISULFATE 75 MG PO TABS: 75.0000 mg | ORAL_TABLET | Freq: Every day | ORAL | 0 refills | Status: DC

## 2019-03-31 NOTE — Telephone Encounter (Signed)
Requested Prescriptions   Signed Prescriptions Disp Refills  . clopidogrel (PLAVIX) 75 MG tablet 90 tablet 0    Sig: Take 1 tablet (75 mg total) by mouth daily with breakfast.    Authorizing Provider: Kathlyn Sacramento A    Ordering User: Britt Bottom

## 2019-03-31 NOTE — Telephone Encounter (Signed)
°*  STAT* If patient is at the pharmacy, call can be transferred to refill team.   1. Which medications need to be refilled? (please list name of each medication and dose if known)  Clopidogrel (PLAVIX) 75 MG - 1 tablet with breakfast   2. Which pharmacy/location (including street and city if local pharmacy) is medication to be sent to? [facility] on [address]   3. Do they need a 30 day or 90 day supply? 90 day - says it was sent in as 30 day but needs 90 day for insurance to pay

## 2019-04-05 ENCOUNTER — Other Ambulatory Visit: Payer: Self-pay

## 2019-04-05 MED ORDER — CLOPIDOGREL BISULFATE 75 MG PO TABS: 75.0000 mg | ORAL_TABLET | Freq: Every day | ORAL | 0 refills | Status: DC

## 2019-04-18 ENCOUNTER — Other Ambulatory Visit: Payer: Self-pay | Admitting: Cardiovascular Disease

## 2019-05-05 ENCOUNTER — Ambulatory Visit: Payer: Medicare Other | Admitting: Cardiovascular Disease

## 2019-05-18 DIAGNOSIS — B0052 Herpesviral keratitis: Secondary | ICD-10-CM | POA: Diagnosis not present

## 2019-05-26 DIAGNOSIS — B0052 Herpesviral keratitis: Secondary | ICD-10-CM | POA: Diagnosis not present

## 2019-06-01 DIAGNOSIS — I209 Angina pectoris, unspecified: Secondary | ICD-10-CM | POA: Diagnosis not present

## 2019-06-01 DIAGNOSIS — E1129 Type 2 diabetes mellitus with other diabetic kidney complication: Secondary | ICD-10-CM | POA: Diagnosis not present

## 2019-06-01 DIAGNOSIS — E785 Hyperlipidemia, unspecified: Secondary | ICD-10-CM | POA: Diagnosis not present

## 2019-06-01 DIAGNOSIS — E1151 Type 2 diabetes mellitus with diabetic peripheral angiopathy without gangrene: Secondary | ICD-10-CM | POA: Diagnosis not present

## 2019-06-01 DIAGNOSIS — R7989 Other specified abnormal findings of blood chemistry: Secondary | ICD-10-CM | POA: Diagnosis not present

## 2019-06-01 DIAGNOSIS — I129 Hypertensive chronic kidney disease with stage 1 through stage 4 chronic kidney disease, or unspecified chronic kidney disease: Secondary | ICD-10-CM | POA: Diagnosis not present

## 2019-06-01 DIAGNOSIS — E890 Postprocedural hypothyroidism: Secondary | ICD-10-CM | POA: Diagnosis not present

## 2019-06-01 DIAGNOSIS — I700 Atherosclerosis of aorta: Secondary | ICD-10-CM | POA: Diagnosis not present

## 2019-06-01 DIAGNOSIS — Z794 Long term (current) use of insulin: Secondary | ICD-10-CM | POA: Diagnosis not present

## 2019-06-01 DIAGNOSIS — I2510 Atherosclerotic heart disease of native coronary artery without angina pectoris: Secondary | ICD-10-CM | POA: Diagnosis not present

## 2019-06-01 DIAGNOSIS — N1832 Chronic kidney disease, stage 3b: Secondary | ICD-10-CM | POA: Diagnosis not present

## 2019-06-01 DIAGNOSIS — I4581 Long QT syndrome: Secondary | ICD-10-CM | POA: Diagnosis not present

## 2019-06-10 DIAGNOSIS — B0052 Herpesviral keratitis: Secondary | ICD-10-CM | POA: Diagnosis not present

## 2019-06-13 DIAGNOSIS — B0052 Herpesviral keratitis: Secondary | ICD-10-CM | POA: Diagnosis not present

## 2019-06-14 ENCOUNTER — Other Ambulatory Visit: Payer: Self-pay

## 2019-06-14 ENCOUNTER — Encounter: Payer: Self-pay | Admitting: Cardiovascular Disease

## 2019-06-14 ENCOUNTER — Ambulatory Visit (INDEPENDENT_AMBULATORY_CARE_PROVIDER_SITE_OTHER): Payer: Medicare Other | Admitting: Cardiovascular Disease

## 2019-06-14 VITALS — BP 140/60 | HR 69 | Ht 64.0 in | Wt 136.0 lb

## 2019-06-14 DIAGNOSIS — I2510 Atherosclerotic heart disease of native coronary artery without angina pectoris: Secondary | ICD-10-CM

## 2019-06-14 DIAGNOSIS — E785 Hyperlipidemia, unspecified: Secondary | ICD-10-CM | POA: Diagnosis not present

## 2019-06-14 DIAGNOSIS — I10 Essential (primary) hypertension: Secondary | ICD-10-CM

## 2019-06-14 NOTE — Patient Instructions (Signed)
Medication Instructions:  Your physician recommends that you continue on your current medications as directed. Please refer to the Current Medication list given to you today.  *If you need a refill on your cardiac medications before your next appointment, please call your pharmacy*  Lab Work: None ordered If you have labs (blood work) drawn today and your tests are completely normal, you will receive your results only by: . MyChart Message (if you have MyChart) OR . A paper copy in the mail If you have any lab test that is abnormal or we need to change your treatment, we will call you to review the results.  Testing/Procedures: None ordered  Follow-Up: At [facility], you and your health needs are our priority.  As part of our continuing mission to provide you with exceptional heart care, we have created designated Provider Care Teams.  These Care Teams include your primary Cardiologist (physician) and Advanced Practice Providers (APPs -  Physician Assistants and Nurse Practitioners) who all work together to provide you with the care you need, when you need it.  Your next appointment:   6 month(s)  The format for your next appointment:   In Person  Provider:    You may see Dr. Arida or one of the following Advanced Practice Providers on your designated Care Team:    Christopher Berge, NP  Ryan Dunn, PA-C  Jacquelyn Visser, PA-C   Other Instructions N/A  

## 2019-06-14 NOTE — Progress Notes (Signed)
Cardiology Office Note   Date:  06/14/2019   ID:  Angela Barrett, Angela Barrett 1940/09/14, MRN [id number]  PCP:  Burnard Bunting, MD  Cardiologist:  Kathlyn Sacramento, MD   Chief Complaint  Patient presents with  . other    1 month follow up. Meds reviewed by the pt. verbally. Pt. c/o weakness and has had eight falls in one year. The patient is feeling sad due to her husband of 11 years passed away in 2019/06/07.       History of Present Illness: Angela Barrett is a 78 y.o. female who presents for a followup regarding coronary artery disease status post CABG and PCI.  She has known history of diabetes and hypertension. She had CABG in June, 2014 after NSTEMI.  She had recurrent angina post CABG. Cardiac cath in 06/06/2013 showed significant 3 vessel CAD with diffuse diabetic vessels. SVG to RPDA/OM3 was found to be occluded. OM3 got collaterals from RCA. EF was low normal. I performed Successful PCI and DES placement to proximal and mid right coronary artery. Left circumflex occlusion was left to be treated medically. Most recent nuclear stress test in January 2016 was normal. Most recent echocardiogram in August 2016 showed normal LV systolic function, mild to moderate mitral regurgitation and mild pulmonary hypertension.   She had recurrent syncope with no evidence of arrhythmia on outpatient monitoring. She had syncope while she was wearing the monitor and she was noted to be in normal sinus rhythm at that time.  She was hospitalized in June with weakness and vomiting without chest pain.  She was found to have mildly elevated troponin. Echocardiogram showed an EF of 45 to 50%.  Cardiac catheterization showed significant underlying three-vessel coronary artery disease with patent LIMA to LAD, patent SVG to diagonal with 50% stenosis in the distal anastomosis, chronically occluded SVG to the right PDA and OM3.  The only difference from prior catheterization was progression of ostial left circumflex  stenosis to 60%.  She was treated medically.  Unfortunately, her husband died in 06-07-23 after prolonged illness with dementia.  She has been extremely stressed and tearful since then but seems to have good social support.  She continues to feel dizzy but she thinks it is coming from her vision.  Antihypertensive medications were discontinued with the hope of improving her dizziness.  She denies any chest pain.   Past Medical History:  Diagnosis Date  . Anemia    "after OHS in 01/2013" (05/25/2013)  . Anginal pain ([place])   . Anxiety   . Arthritis    "joints" (05/25/2013)  . At risk for falls   . Coronary artery disease    a. nl myoview ~ 2006;  b. 01/2013 s/p MI and CABG x 4 (VG->D2, VG->RPDA->OM3, LIMA->LAD;  c. 2013-06-06 Cath/PCI: LM nl, LAD 60-70p/m, D1 70p, LCX 133m, OM2 60p, RCA 50-60p/38m (3.0x33 Xience DES), VG->D2 60, VG->RPDA/OM3 100, LIMA->LAD nl w 70 dLAD, EF 50%.  . Depression   . Diabetes mellitus, type 2 ([place])    "dx'd 1992" (05/25/2013)  . Dyspnea    DOE  . Gait disturbance    uses walker some  . GERD (gastroesophageal reflux disease)   . Glaucoma    bilateral  . Headache(784.0)    "monthly" (05/25/2013)  . History of hiatal hernia   . History of pneumonia 1985  . Hyperlipidemia   . Hypertension   . Hypothyroidism   . IBS (irritable bowel syndrome)   . Lymphocytosis 03/24/2012  .  Myocardial infarction ([place])    2014  . OA (osteoarthritis)   . PONV (postoperative nausea and vomiting)   . Vertigo     Past Surgical History:  Procedure Laterality Date  . BLADDER SUSPENSION  2005  . CARDIAC CATHETERIZATION  01/2013  . CARPAL TUNNEL RELEASE Bilateral 1990-1992  . CATARACT EXTRACTION W/PHACO Left 08/17/2018   Procedure: CATARACT EXTRACTION PHACO AND INTRAOCULAR LENS PLACEMENT (IOC) LEFT, DIABETIC;  Surgeon: Birder Robson, MD;  Location: [facility];  Service: Ophthalmology;  Laterality: Left;  [place] 01:01 CDE 8.51 Fluid pack lot # [id number]  . CATARACT EXTRACTION  W/PHACO Right 09/14/2018   Procedure: CATARACT EXTRACTION PHACO AND INTRAOCULAR LENS PLACEMENT (IOC) RIGHT, DIABETIC;  Surgeon: Birder Robson, MD;  Location: [facility];  Service: Ophthalmology;  Laterality: Right;  [place]  01:39 CDE 15.79 Fluid pack lot # [id number]  . COLONOSCOPY WITH PROPOFOL N/A 11/20/2015   Procedure: COLONOSCOPY WITH PROPOFOL;  Surgeon: Lucilla Lame, MD;  Location: [facility];  Service: Endoscopy;  Laterality: N/A;  . CORONARY ANGIOPLASTY WITH STENT PLACEMENT  05/25/2013   "1" (05/25/2013)  . CORONARY ARTERY BYPASS GRAFT N/A 01/07/2013   Procedure: CORONARY ARTERY BYPASS GRAFTING (CABG);  Surgeon: Melrose Nakayama, MD;  Location: [place];  Service: Open Heart Surgery;  Laterality: N/A;  . ESOPHAGOGASTRODUODENOSCOPY (EGD) WITH PROPOFOL N/A 11/20/2015   Procedure: ESOPHAGOGASTRODUODENOSCOPY (EGD) WITH PROPOFOL;  Surgeon: Lucilla Lame, MD;  Location: [facility];  Service: Endoscopy;  Laterality: N/A;  . HAMMER TOE SURGERY Right 2009  . KNEE ARTHROSCOPY Bilateral 1993-1996  . LEFT HEART CATH AND CORS/GRAFTS ANGIOGRAPHY N/A 01/28/2019   Procedure: LEFT HEART CATH AND CORS/GRAFTS ANGIOGRAPHY;  Surgeon: Nelva Bush, MD;  Location: [place] CV LAB;  Service: Cardiovascular;  Laterality: N/A;  . LEFT HEART CATHETERIZATION WITH CORONARY ANGIOGRAM N/A 01/05/2013   Procedure: LEFT HEART CATHETERIZATION WITH CORONARY ANGIOGRAM;  Surgeon: Wellington Hampshire, MD;  Location: [place] CATH LAB;  Service: Cardiovascular;  Laterality: N/A;  . LEFT HEART CATHETERIZATION WITH CORONARY/GRAFT ANGIOGRAM N/A 05/25/2013   Procedure: LEFT HEART CATHETERIZATION WITH Beatrix Fetters;  Surgeon: Wellington Hampshire, MD;  Location: [place] CATH LAB;  Service: Cardiovascular;  Laterality: N/A;  . PERCUTANEOUS CORONARY STENT INTERVENTION (PCI-S)  05/25/2013   Procedure: PERCUTANEOUS CORONARY STENT INTERVENTION (PCI-S);  Surgeon: Wellington Hampshire, MD;  Location: [facility] CATH LAB;  Service: Cardiovascular;;  .  THYROIDECTOMY  2010  . VAGINAL HYSTERECTOMY  1974     Current Outpatient Medications  Medication Sig Dispense Refill  . acetaminophen (TYLENOL ARTHRITIS PAIN) 650 MG CR tablet Take 650 mg by mouth at bedtime.     Marland Kitchen aspirin 81 MG tablet Take 1 tablet (81 mg total) by mouth daily.    . B-D INS SYRINGE 0.5CC/31GX5/16 31G X 5/16" 0.5 ML MISC daily. as directed  5  . B-D ULTRAFINE III SHORT PEN 31G X 8 MM MISC daily. as directed  5  . Carboxymethylcellulose Sodium (THERATEARS) 0.25 % SOLN Place 1 drop into both eyes 3 (three) times daily as needed (for dry/irritated eyes.).    Marland Kitchen clopidogrel (PLAVIX) 75 MG tablet Take 1 tablet (75 mg total) by mouth daily with breakfast. 90 tablet 0  . latanoprost (XALATAN) 0.005 % ophthalmic solution Place 1 drop into both eyes at bedtime.     Marland Kitchen LEVEMIR 100 UNIT/ML injection Inject 20 Units into the skin at bedtime. Sliding scale  3  . levothyroxine (SYNTHROID, LEVOTHROID) 88 MCG tablet Take 88 mcg by mouth daily before breakfast.    .  LORazepam (ATIVAN) 1 MG tablet Take 2.5 mg by mouth at bedtime.     . meclizine (ANTIVERT) 25 MG tablet Take 25 mg by mouth daily.    . nitroGLYCERIN (NITROSTAT) 0.4 MG SL tablet PLACE 1 TABLET (0.4 MG TOTAL) UNDER THE TONGUE EVERY 5 (FIVE) MINUTES AS NEEDED FOR CHEST PAIN. 25 tablet 0  . ondansetron (ZOFRAN ODT) 4 MG disintegrating tablet Take 1 tablet (4 mg total) by mouth every 8 (eight) hours as needed for nausea or vomiting. 15 tablet 0  . ONE TOUCH ULTRA TEST test strip USE AS DIRECTED TO TEST BLOOD SUGAR 3 TIMES A DAY DX E11.51  3  . pantoprazole (PROTONIX) 40 MG tablet Take 40 mg by mouth daily before breakfast.    . rosuvastatin (CRESTOR) 5 MG tablet TAKE 1 TABLET BY MOUTH EVERY DAY WITH BREAKFAST 90 tablet 3  . sertraline (ZOLOFT) 25 MG tablet Take 25 mg by mouth daily.     No current facility-administered medications for this visit.     Allergies:   Amitriptyline, Atorvastatin, Cortisone, Macrodantin [nitrofurantoin],  and Motrin [ibuprofen]    Social History:  The patient  reports that she has never smoked. She has never used smokeless tobacco. She reports that she does not drink alcohol or use drugs.   Family History:  The patient's family history includes Cancer in her mother; Diabetes in her son; Emphysema in her brother and father.    ROS:  Please see the history of present illness.   Otherwise, review of systems are positive for none.   All other systems are reviewed and negative.    PHYSICAL EXAM: VS:  BP 140/60 (BP Location: Left Arm, Patient Position: Sitting, Cuff Size: Normal)   Pulse 69   Ht 5\' 4"  (1.626 m)   Wt 136 lb (61.7 kg)   BMI 23.34 kg/m  , BMI Body mass index is 23.34 kg/m. GEN: Well nourished, well developed, in no acute distress  HEENT: normal  Neck: no JVD, carotid bruits, or masses Cardiac: RRR; no murmurs, rubs, or gallops,no edema  Respiratory:  clear to auscultation bilaterally, normal work of breathing GI: soft, nontender, nondistended, + BS MS: no deformity or atrophy  Skin: warm and dry, no rash Neuro:  Strength and sensation are intact Psych: euthymic mood, full affect Left radial pulses normal with no hematoma.  There is ecchymosis.  EKG:  EKG is ordered today. The ekg ordered today demonstrates normal sinus rhythm with left axis deviation.  No significant ST or T wave changes.   Recent Labs: 01/26/2019: ALT 19; TSH 1.648 01/29/2019: BUN 18; Creatinine, Ser 1.27; Hemoglobin 10.3; Platelets 248; Potassium 4.2; Sodium 141    Lipid Panel    Component Value Date/Time   CHOL 164 06/24/2013 1031   TRIG 143 06/24/2013 1031   HDL 49 06/24/2013 1031   CHOLHDL 3.3 06/24/2013 1031   CHOLHDL 6.2 01/06/2013 0245   VLDL 28 01/06/2013 0245   LDLCALC 86 06/24/2013 1031      Wt Readings from Last 3 Encounters:  06/14/19 136 lb (61.7 kg)  02/03/19 135 lb 12 oz (61.6 kg)  01/29/19 130 lb 3.2 oz (59.1 kg)        ASSESSMENT AND PLAN:  1.  Coronary artery  disease involving bypass graft without angina: She is overall doing well with no convincing symptoms of angina.  I recommend continuing medical therapy.  She continues to be on dual antiplatelet therapy given that she had possible small non-STEMI in June.  2. Essential hypertension: Antihypertensive medications were discontinued due to concerns about dizziness.  She reports no change in symptoms since they were stopped but her blood pressure remains reasonably controlled without medications.  We could consider resuming small dose carvedilol and lisinopril down the road if needed.  3. Hyperlipidemia with intolerance to statins. She has been able to tolerate small dose rosuvastatin .   4.  Poor balance and dizziness: Being followed by neurology.    Disposition:   FU with me in 6 months   Signed, Kathlyn Sacramento, MD 06/14/19 [place], [place]

## 2019-06-20 DIAGNOSIS — B0052 Herpesviral keratitis: Secondary | ICD-10-CM | POA: Diagnosis not present

## 2019-06-27 DIAGNOSIS — B0052 Herpesviral keratitis: Secondary | ICD-10-CM | POA: Diagnosis not present

## 2019-06-27 DIAGNOSIS — M3501 Sicca syndrome with keratoconjunctivitis: Secondary | ICD-10-CM | POA: Diagnosis not present

## 2019-08-29 ENCOUNTER — Other Ambulatory Visit: Payer: Self-pay | Admitting: Cardiovascular Disease

## 2019-09-08 DIAGNOSIS — H168 Other keratitis: Secondary | ICD-10-CM | POA: Diagnosis not present

## 2019-09-12 DIAGNOSIS — H168 Other keratitis: Secondary | ICD-10-CM | POA: Diagnosis not present

## 2019-09-14 DIAGNOSIS — B0052 Herpesviral keratitis: Secondary | ICD-10-CM | POA: Diagnosis not present

## 2019-09-21 DIAGNOSIS — H168 Other keratitis: Secondary | ICD-10-CM | POA: Diagnosis not present

## 2019-09-28 DIAGNOSIS — H168 Other keratitis: Secondary | ICD-10-CM | POA: Diagnosis not present

## 2019-10-03 DIAGNOSIS — B0052 Herpesviral keratitis: Secondary | ICD-10-CM | POA: Diagnosis not present

## 2019-10-03 DIAGNOSIS — E1129 Type 2 diabetes mellitus with other diabetic kidney complication: Secondary | ICD-10-CM | POA: Diagnosis not present

## 2019-10-03 DIAGNOSIS — I700 Atherosclerosis of aorta: Secondary | ICD-10-CM | POA: Diagnosis not present

## 2019-10-03 DIAGNOSIS — Z794 Long term (current) use of insulin: Secondary | ICD-10-CM | POA: Diagnosis not present

## 2019-10-03 DIAGNOSIS — I129 Hypertensive chronic kidney disease with stage 1 through stage 4 chronic kidney disease, or unspecified chronic kidney disease: Secondary | ICD-10-CM | POA: Diagnosis not present

## 2019-10-03 DIAGNOSIS — Z1331 Encounter for screening for depression: Secondary | ICD-10-CM | POA: Diagnosis not present

## 2019-10-03 DIAGNOSIS — N1832 Chronic kidney disease, stage 3b: Secondary | ICD-10-CM | POA: Diagnosis not present

## 2019-10-03 DIAGNOSIS — I2510 Atherosclerotic heart disease of native coronary artery without angina pectoris: Secondary | ICD-10-CM | POA: Diagnosis not present

## 2019-10-03 DIAGNOSIS — E1151 Type 2 diabetes mellitus with diabetic peripheral angiopathy without gangrene: Secondary | ICD-10-CM | POA: Diagnosis not present

## 2019-10-12 DIAGNOSIS — B0052 Herpesviral keratitis: Secondary | ICD-10-CM | POA: Diagnosis not present

## 2019-10-14 ENCOUNTER — Encounter: Payer: Self-pay | Admitting: Cardiovascular Disease

## 2019-10-14 ENCOUNTER — Ambulatory Visit (INDEPENDENT_AMBULATORY_CARE_PROVIDER_SITE_OTHER): Payer: Medicare Other | Admitting: Cardiovascular Disease

## 2019-10-14 ENCOUNTER — Other Ambulatory Visit: Payer: Self-pay

## 2019-10-14 VITALS — BP 140/62 | HR 59 | Ht 63.0 in | Wt 141.5 lb

## 2019-10-14 DIAGNOSIS — I2510 Atherosclerotic heart disease of native coronary artery without angina pectoris: Secondary | ICD-10-CM | POA: Diagnosis not present

## 2019-10-14 DIAGNOSIS — E785 Hyperlipidemia, unspecified: Secondary | ICD-10-CM

## 2019-10-14 DIAGNOSIS — I10 Essential (primary) hypertension: Secondary | ICD-10-CM

## 2019-10-14 NOTE — Progress Notes (Signed)
Cardiology Office Note   Date:  10/14/2019   ID:  Angela Barrett, DOB 07/15/1941, MRN [id number]  PCP:  Burnard Bunting, MD  Cardiologist:  Kathlyn Sacramento, MD   Chief Complaint  Patient presents with  . other    Pt. c/o shortness of breath with little to no exertion and chest discomfort when lying flat as well as pounding in chest.       History of Present Illness: Angela Barrett is a 79 y.o. female who presents for a followup regarding coronary artery disease status post CABG and PCI.  She has known history of diabetes and hypertension. She had CABG in June, 2014 after NSTEMI.  She had recurrent angina post CABG. Cardiac cath in 06-12-13 showed significant 3 vessel CAD with diffuse diabetic vessels. SVG to RPDA/OM3 was found to be occluded. OM3 got collaterals from RCA. EF was low normal. I performed Successful PCI and DES placement to proximal and mid right coronary artery. Left circumflex occlusion was left to be treated medically. Most recent nuclear stress test in January 2016 was normal. Most recent echocardiogram in August 2016 showed normal LV systolic function, mild to moderate mitral regurgitation and mild pulmonary hypertension.   She had recurrent syncope with no evidence of arrhythmia on outpatient monitoring. She had syncope while she was wearing the monitor and she was noted to be in normal sinus rhythm at that time.  She was hospitalized in June of 2020 with weakness and vomiting without chest pain.  She was found to have mildly elevated troponin. Echocardiogram showed an EF of 45 to 50%.  Cardiac catheterization showed significant underlying three-vessel coronary artery disease with patent LIMA to LAD, patent SVG to diagonal with 50% stenosis in the distal anastomosis, chronically occluded SVG to the right PDA and OM3.  The only difference from prior catheterization was progression of ostial left circumflex stenosis to 60%.  She was treated medically.  Unfortunately, her  husband died in 2023-06-13 after prolonged illness with dementia.  She had significant depression after that but seems to be gradually improving.  She reports that her chest pain has been overall stable with no worsening.  She is off antihypertensive medications due to dizziness and syncope.   Past Medical History:  Diagnosis Date  . Anemia    "after OHS in 01/2013" (05/25/2013)  . Anginal pain ([place])   . Anxiety   . Arthritis    "joints" (05/25/2013)  . At risk for falls   . Coronary artery disease    a. nl myoview ~ 2006;  b. 01/2013 s/p MI and CABG x 4 (VG->D2, VG->RPDA->OM3, LIMA->LAD;  c. 06/12/13 Cath/PCI: LM nl, LAD 60-70p/m, D1 70p, LCX 163m, OM2 60p, RCA 50-60p/80m (3.0x33 Xience DES), VG->D2 60, VG->RPDA/OM3 100, LIMA->LAD nl w 70 dLAD, EF 50%.  . Depression   . Diabetes mellitus, type 2 ([place])    "dx'd 1992" (05/25/2013)  . Dyspnea    DOE  . Gait disturbance    uses walker some  . GERD (gastroesophageal reflux disease)   . Glaucoma    bilateral  . Headache(784.0)    "monthly" (05/25/2013)  . History of hiatal hernia   . History of pneumonia 1985  . Hyperlipidemia   . Hypertension   . Hypothyroidism   . IBS (irritable bowel syndrome)   . Lymphocytosis 03/24/2012  . Myocardial infarction ([place])    2014  . OA (osteoarthritis)   . PONV (postoperative nausea and vomiting)   . Vertigo  Past Surgical History:  Procedure Laterality Date  . BLADDER SUSPENSION  2005  . CARDIAC CATHETERIZATION  01/2013  . CARPAL TUNNEL RELEASE Bilateral 1990-1992  . CATARACT EXTRACTION W/PHACO Left 08/17/2018   Procedure: CATARACT EXTRACTION PHACO AND INTRAOCULAR LENS PLACEMENT (IOC) LEFT, DIABETIC;  Surgeon: Birder Robson, MD;  Location: [facility];  Service: Ophthalmology;  Laterality: Left;  [place] 01:01 CDE 8.51 Fluid pack lot # [id number]  . CATARACT EXTRACTION W/PHACO Right 09/14/2018   Procedure: CATARACT EXTRACTION PHACO AND INTRAOCULAR LENS PLACEMENT (IOC) RIGHT, DIABETIC;  Surgeon:  Birder Robson, MD;  Location: [facility];  Service: Ophthalmology;  Laterality: Right;  [place]  01:39 CDE 15.79 Fluid pack lot # [id number]  . COLONOSCOPY WITH PROPOFOL N/A 11/20/2015   Procedure: COLONOSCOPY WITH PROPOFOL;  Surgeon: Lucilla Lame, MD;  Location: [facility];  Service: Endoscopy;  Laterality: N/A;  . CORONARY ANGIOPLASTY WITH STENT PLACEMENT  05/25/2013   "1" (05/25/2013)  . CORONARY ARTERY BYPASS GRAFT N/A 01/07/2013   Procedure: CORONARY ARTERY BYPASS GRAFTING (CABG);  Surgeon: Melrose Nakayama, MD;  Location: [facility];  Service: Open Heart Surgery;  Laterality: N/A;  . ESOPHAGOGASTRODUODENOSCOPY (EGD) WITH PROPOFOL N/A 11/20/2015   Procedure: ESOPHAGOGASTRODUODENOSCOPY (EGD) WITH PROPOFOL;  Surgeon: Lucilla Lame, MD;  Location: [facility];  Service: Endoscopy;  Laterality: N/A;  . HAMMER TOE SURGERY Right 2009  . KNEE ARTHROSCOPY Bilateral 1993-1996  . LEFT HEART CATH AND CORS/GRAFTS ANGIOGRAPHY N/A 01/28/2019   Procedure: LEFT HEART CATH AND CORS/GRAFTS ANGIOGRAPHY;  Surgeon: Nelva Bush, MD;  Location: [facility];  Service: Cardiovascular;  Laterality: N/A;  . LEFT HEART CATHETERIZATION WITH CORONARY ANGIOGRAM N/A 01/05/2013   Procedure: LEFT HEART CATHETERIZATION WITH CORONARY ANGIOGRAM;  Surgeon: Wellington Hampshire, MD;  Location: [facility];  Service: Cardiovascular;  Laterality: N/A;  . LEFT HEART CATHETERIZATION WITH CORONARY/GRAFT ANGIOGRAM N/A 05/25/2013   Procedure: LEFT HEART CATHETERIZATION WITH Beatrix Fetters;  Surgeon: Wellington Hampshire, MD;  Location: [place];  Service: Cardiovascular;  Laterality: N/A;  . PERCUTANEOUS CORONARY STENT INTERVENTION (PCI-S)  05/25/2013   Procedure: PERCUTANEOUS CORONARY STENT INTERVENTION (PCI-S);  Surgeon: Wellington Hampshire, MD;  Location: [facility];  Service: Cardiovascular;;  . THYROIDECTOMY  2010  . VAGINAL HYSTERECTOMY  1974     Current Outpatient Medications  Medication Sig Dispense Refill  .  acetaminophen (TYLENOL ARTHRITIS PAIN) 650 MG CR tablet Take 650 mg by mouth at bedtime.     Marland Kitchen acyclovir (ZOVIRAX) 400 MG tablet Take 400 mg by mouth 2 (two) times daily.    Marland Kitchen aspirin 81 MG tablet Take 1 tablet (81 mg total) by mouth daily.    . B-D INS SYRINGE 0.5CC/31GX5/16 31G X 5/16" 0.5 ML MISC daily. as directed  5  . B-D ULTRAFINE III SHORT PEN 31G X 8 MM MISC daily. as directed  5  . Carboxymethylcellulose Sodium (THERATEARS) 0.25 % SOLN Place 1 drop into both eyes 3 (three) times daily as needed (for dry/irritated eyes.).    Marland Kitchen clopidogrel (PLAVIX) 75 MG tablet TAKE 1 TABLET (75 MG TOTAL) BY MOUTH DAILY WITH BREAKFAST. 90 tablet 0  . Ganciclovir (ZIRGAN) 0.15 % GEL APPLY 1 A SMALL AMOUNT IN RIGHT EYE 5 TIMES A DAY UNTIL DIRECTED    . HUMALOG KWIKPEN 100 UNIT/ML KwikPen     . latanoprost (XALATAN) 0.005 % ophthalmic solution Place 1 drop into both eyes at bedtime.     Marland Kitchen LEVEMIR 100 UNIT/ML injection Inject 20 Units into the skin at bedtime.  Sliding scale  3  . levothyroxine (SYNTHROID, LEVOTHROID) 88 MCG tablet Take 88 mcg by mouth daily before breakfast.    . LORazepam (ATIVAN) 1 MG tablet Take 2.5 mg by mouth at bedtime.     . meclizine (ANTIVERT) 25 MG tablet Take 25 mg by mouth daily.    . metFORMIN (GLUCOPHAGE) 500 MG tablet Take 1,000 mg by mouth 2 (two) times daily.    . nitroGLYCERIN (NITROSTAT) 0.4 MG SL tablet PLACE 1 TABLET (0.4 MG TOTAL) UNDER THE TONGUE EVERY 5 (FIVE) MINUTES AS NEEDED FOR CHEST PAIN. 25 tablet 0  . ondansetron (ZOFRAN ODT) 4 MG disintegrating tablet Take 1 tablet (4 mg total) by mouth every 8 (eight) hours as needed for nausea or vomiting. 15 tablet 0  . ONE TOUCH ULTRA TEST test strip USE AS DIRECTED TO TEST BLOOD SUGAR 3 TIMES A DAY DX E11.51  3  . OXERVATE 0.002 % SOLN     . pantoprazole (PROTONIX) 40 MG tablet Take 40 mg by mouth daily before breakfast.    . rosuvastatin (CRESTOR) 5 MG tablet TAKE 1 TABLET BY MOUTH EVERY DAY WITH BREAKFAST 90 tablet 3  .  sertraline (ZOLOFT) 25 MG tablet Take 25 mg by mouth daily.    . valACYclovir (VALTREX) 1000 MG tablet Take 1,000 mg by mouth 3 (three) times daily.     No current facility-administered medications for this visit.    Allergies:   Amitriptyline, Atorvastatin, Cortisone, Macrodantin [nitrofurantoin], and Motrin [ibuprofen]    Social History:  The patient  reports that she has never smoked. She has never used smokeless tobacco. She reports that she does not drink alcohol or use drugs.   Family History:  The patient's family history includes Cancer in her mother; Diabetes in her son; Emphysema in her brother and father.    ROS:  Please see the history of present illness.   Otherwise, review of systems are positive for none.   All other systems are reviewed and negative.    PHYSICAL EXAM: VS:  BP 140/62 (BP Location: Left Arm, Patient Position: Sitting, Cuff Size: Normal)   Pulse (!) 59   Ht 5\' 3"  (1.6 m)   Wt 141 lb 8 oz (64.2 kg)   SpO2 98%   BMI 25.07 kg/m  , BMI Body mass index is 25.07 kg/m. GEN: Well nourished, well developed, in no acute distress  HEENT: normal  Neck: no JVD, carotid bruits, or masses Cardiac: RRR; no murmurs, rubs, or gallops,no edema  Respiratory:  clear to auscultation bilaterally, normal work of breathing GI: soft, nontender, nondistended, + BS MS: no deformity or atrophy  Skin: warm and dry, no rash Neuro:  Strength and sensation are intact Psych: euthymic mood, full affect Left radial pulses normal with no hematoma.  There is ecchymosis.  EKG:  EKG is ordered today. The ekg ordered today demonstrates sinus bradycardia with a heart rate of 59 bpm..  No significant ST or T wave changes.   Recent Labs: 01/26/2019: ALT 19; TSH 1.648 01/29/2019: BUN 18; Creatinine, Ser 1.27; Hemoglobin 10.3; Platelets 248; Potassium 4.2; Sodium 141    Lipid Panel    Component Value Date/Time   CHOL 164 06/24/2013 1031   TRIG 143 06/24/2013 1031   HDL 49 06/24/2013  1031   CHOLHDL 3.3 06/24/2013 1031   CHOLHDL 6.2 01/06/2013 0245   VLDL 28 01/06/2013 0245   LDLCALC 86 06/24/2013 1031      Wt Readings from Last 3 Encounters:  10/14/19  141 lb 8 oz (64.2 kg)  06/14/19 136 lb (61.7 kg)  02/03/19 135 lb 12 oz (61.6 kg)        ASSESSMENT AND PLAN:  1.  Coronary artery disease involving bypass graft without angina: She is overall doing well with no convincing symptoms of angina.  I recommend continuing medical therapy.  She continues to be on dual antiplatelet therapy given that she had possible small non-STEMI in June.  2. Essential hypertension: Antihypertensive medications were discontinued due to concerns about dizziness.  She continues to be bradycardic and thus no need to resume carvedilol.  She was on an ACE inhibitor but will continue to monitor blood pressure off medications for now given improvement in her dizziness and recurrent syncope.  3. Hyperlipidemia with intolerance to statins. She has been able to tolerate small dose rosuvastatin .   4.  Poor balance and dizziness: Being followed by neurology.    Disposition:   FU with me in 6 months   Signed, Kathlyn Sacramento, MD 10/14/19 [place], [place]

## 2019-10-14 NOTE — Patient Instructions (Signed)
Medication Instructions:  Your physician recommends that you continue on your current medications as directed. Please refer to the Current Medication list given to you today.  *If you need a refill on your cardiac medications before your next appointment, please call your pharmacy*   Lab Work: None ordered If you have labs (blood work) drawn today and your tests are completely normal, you will receive your results only by: Marland Kitchen MyChart Message (if you have MyChart) OR . A paper copy in the mail If you have any lab test that is abnormal or we need to change your treatment, we will call you to review the results.   Testing/Procedures: None orderd   Follow-Up: At [facility], you and your health needs are our priority.  As part of our continuing mission to provide you with exceptional heart care, we have created designated Provider Care Teams.  These Care Teams include your primary Cardiologist (physician) and Advanced Practice Providers (APPs -  Physician Assistants and Nurse Practitioners) who all work together to provide you with the care you need, when you need it.  We recommend signing up for the patient portal called "MyChart".  Sign up information is provided on this After Visit Summary.  MyChart is used to connect with patients for Virtual Visits (Telemedicine).  Patients are able to view lab/test results, encounter notes, upcoming appointments, etc.  Non-urgent messages can be sent to your provider as well.   To learn more about what you can do with MyChart, go to [email or web address].    Your next appointment:   6 month(s)  The format for your next appointment:   In Person  Provider:    You may see Dr. Fletcher Anon or one of the following Advanced Practice Providers on your designated Care Team:    Murray Hodgkins, NP  Christell Faith, PA-C  Marrianne Mood, PA-C    Other Instructions N/A

## 2019-10-19 DIAGNOSIS — H168 Other keratitis: Secondary | ICD-10-CM | POA: Diagnosis not present

## 2019-10-24 DIAGNOSIS — B0052 Herpesviral keratitis: Secondary | ICD-10-CM | POA: Diagnosis not present

## 2019-11-02 DIAGNOSIS — B0052 Herpesviral keratitis: Secondary | ICD-10-CM | POA: Diagnosis not present

## 2019-11-09 DIAGNOSIS — H168 Other keratitis: Secondary | ICD-10-CM | POA: Diagnosis not present

## 2019-11-15 ENCOUNTER — Other Ambulatory Visit: Payer: Self-pay | Admitting: Cardiovascular Disease

## 2019-11-23 ENCOUNTER — Other Ambulatory Visit: Payer: Self-pay | Admitting: Cardiovascular Disease

## 2019-11-23 DIAGNOSIS — H168 Other keratitis: Secondary | ICD-10-CM | POA: Diagnosis not present

## 2019-12-13 ENCOUNTER — Ambulatory Visit: Payer: Medicare Other | Admitting: Cardiovascular Disease

## 2019-12-14 DIAGNOSIS — H168 Other keratitis: Secondary | ICD-10-CM | POA: Diagnosis not present

## 2019-12-28 DIAGNOSIS — E890 Postprocedural hypothyroidism: Secondary | ICD-10-CM | POA: Diagnosis not present

## 2019-12-28 DIAGNOSIS — E1129 Type 2 diabetes mellitus with other diabetic kidney complication: Secondary | ICD-10-CM | POA: Diagnosis not present

## 2019-12-28 DIAGNOSIS — E7849 Other hyperlipidemia: Secondary | ICD-10-CM | POA: Diagnosis not present

## 2020-01-04 DIAGNOSIS — E1151 Type 2 diabetes mellitus with diabetic peripheral angiopathy without gangrene: Secondary | ICD-10-CM | POA: Diagnosis not present

## 2020-01-04 DIAGNOSIS — K589 Irritable bowel syndrome without diarrhea: Secondary | ICD-10-CM | POA: Diagnosis not present

## 2020-01-04 DIAGNOSIS — Z794 Long term (current) use of insulin: Secondary | ICD-10-CM | POA: Diagnosis not present

## 2020-01-04 DIAGNOSIS — Z0000 Encounter for general adult medical examination without abnormal findings: Secondary | ICD-10-CM | POA: Diagnosis not present

## 2020-01-04 DIAGNOSIS — Z1212 Encounter for screening for malignant neoplasm of rectum: Secondary | ICD-10-CM | POA: Diagnosis not present

## 2020-01-04 DIAGNOSIS — N1832 Chronic kidney disease, stage 3b: Secondary | ICD-10-CM | POA: Diagnosis not present

## 2020-01-04 DIAGNOSIS — E890 Postprocedural hypothyroidism: Secondary | ICD-10-CM | POA: Diagnosis not present

## 2020-01-04 DIAGNOSIS — I700 Atherosclerosis of aorta: Secondary | ICD-10-CM | POA: Diagnosis not present

## 2020-01-04 DIAGNOSIS — R82998 Other abnormal findings in urine: Secondary | ICD-10-CM | POA: Diagnosis not present

## 2020-01-04 DIAGNOSIS — M1990 Unspecified osteoarthritis, unspecified site: Secondary | ICD-10-CM | POA: Diagnosis not present

## 2020-01-04 DIAGNOSIS — E7849 Other hyperlipidemia: Secondary | ICD-10-CM | POA: Diagnosis not present

## 2020-01-04 DIAGNOSIS — E1129 Type 2 diabetes mellitus with other diabetic kidney complication: Secondary | ICD-10-CM | POA: Diagnosis not present

## 2020-01-04 DIAGNOSIS — I2510 Atherosclerotic heart disease of native coronary artery without angina pectoris: Secondary | ICD-10-CM | POA: Diagnosis not present

## 2020-01-04 DIAGNOSIS — I129 Hypertensive chronic kidney disease with stage 1 through stage 4 chronic kidney disease, or unspecified chronic kidney disease: Secondary | ICD-10-CM | POA: Diagnosis not present

## 2020-01-18 DIAGNOSIS — H168 Other keratitis: Secondary | ICD-10-CM | POA: Diagnosis not present

## 2020-02-08 DIAGNOSIS — Z1212 Encounter for screening for malignant neoplasm of rectum: Secondary | ICD-10-CM | POA: Diagnosis not present

## 2020-02-19 ENCOUNTER — Other Ambulatory Visit: Payer: Self-pay | Admitting: Cardiovascular Disease

## 2020-04-10 ENCOUNTER — Other Ambulatory Visit: Payer: Self-pay | Admitting: Cardiovascular Disease

## 2020-04-10 DIAGNOSIS — U071 COVID-19: Secondary | ICD-10-CM | POA: Diagnosis not present

## 2020-04-10 DIAGNOSIS — W19XXXA Unspecified fall, initial encounter: Secondary | ICD-10-CM | POA: Diagnosis not present

## 2020-04-14 ENCOUNTER — Other Ambulatory Visit: Payer: Self-pay | Admitting: Cardiovascular Disease

## 2020-04-26 ENCOUNTER — Other Ambulatory Visit: Payer: Self-pay

## 2020-04-26 ENCOUNTER — Encounter: Payer: Self-pay | Admitting: Cardiovascular Disease

## 2020-04-26 ENCOUNTER — Ambulatory Visit (INDEPENDENT_AMBULATORY_CARE_PROVIDER_SITE_OTHER): Payer: Medicare Other | Admitting: Cardiovascular Disease

## 2020-04-26 VITALS — BP 156/74 | HR 76 | Ht 63.0 in | Wt 138.0 lb

## 2020-04-26 DIAGNOSIS — I10 Essential (primary) hypertension: Secondary | ICD-10-CM

## 2020-04-26 DIAGNOSIS — I2510 Atherosclerotic heart disease of native coronary artery without angina pectoris: Secondary | ICD-10-CM | POA: Diagnosis not present

## 2020-04-26 DIAGNOSIS — E785 Hyperlipidemia, unspecified: Secondary | ICD-10-CM

## 2020-04-26 MED ORDER — AMLODIPINE BESYLATE 2.5 MG PO TABS: 2.5000 mg | ORAL_TABLET | Freq: Every day | ORAL | 5 refills | Status: DC

## 2020-04-26 NOTE — Patient Instructions (Signed)
Medication Instructions:  Your physician has recommended you make the following change in your medication:   START Amlodipine 2.5 mg daily. An Rx has been sent to your pharmacy.  *If you need a refill on your cardiac medications before your next appointment, please call your pharmacy*   Lab Work: None ordered If you have labs (blood work) drawn today and your tests are completely normal, you will receive your results only by: Marland Kitchen MyChart Message (if you have MyChart) OR . A paper copy in the mail If you have any lab test that is abnormal or we need to change your treatment, we will call you to review the results.   Testing/Procedures: None ordered   Follow-Up: At [facility], you and your health needs are our priority.  As part of our continuing mission to provide you with exceptional heart care, we have created designated Provider Care Teams.  These Care Teams include your primary Cardiologist (physician) and Advanced Practice Providers (APPs -  Physician Assistants and Nurse Practitioners) who all work together to provide you with the care you need, when you need it.  We recommend signing up for the patient portal called "MyChart".  Sign up information is provided on this After Visit Summary.  MyChart is used to connect with patients for Virtual Visits (Telemedicine).  Patients are able to view lab/test results, encounter notes, upcoming appointments, etc.  Non-urgent messages can be sent to your provider as well.   To learn more about what you can do with MyChart, go to [email or web address].    Your next appointment:   4 month(s)  The format for your next appointment:   In Person  Provider:   You may see Dr. Fletcher Anon or one of the following Advanced Practice Providers on your designated Care Team:    Murray Hodgkins, NP  Christell Faith, PA-C  Marrianne Mood, PA-C  Cadence Kathlen Mody, [place]    Other Instructions N/A

## 2020-04-26 NOTE — Progress Notes (Signed)
Cardiology Office Note   Date:  04/26/2020   ID:  Angela, Barrett 1941-03-07, MRN [id number]  PCP:  Burnard Bunting, MD  Cardiologist:  Kathlyn Sacramento, MD   Chief Complaint  Patient presents with  . Follow-up    6 Month follow up. Medications verbally reviewed with patient.       History of Present Illness: Angela Barrett is a 79 y.o. female who presents for a followup regarding coronary artery disease status post CABG and PCI.  She has known history of diabetes and hypertension. She had CABG in June, 2014 after NSTEMI.  She had recurrent angina post CABG. Cardiac cath in 05/2013 showed significant 3 vessel CAD with diffuse diabetic vessels. SVG to RPDA/OM3 was found to be occluded. OM3 got collaterals from RCA. EF was low normal. I performed Successful PCI and DES placement to proximal and mid right coronary artery. Left circumflex occlusion was left to be treated medically. Most recent nuclear stress test in January 2016 was normal. Most recent echocardiogram in August 2016 showed normal LV systolic function, mild to moderate mitral regurgitation and mild pulmonary hypertension.   She had recurrent syncope with no evidence of arrhythmia on outpatient monitoring. She had syncope while she was wearing the monitor and she was noted to be in normal sinus rhythm at that time.  She was hospitalized in June of 2020 with weakness and vomiting without chest pain.  She was found to have mildly elevated troponin. Echocardiogram showed an EF of 45 to 50%.  Cardiac catheterization showed significant underlying three-vessel coronary artery disease with patent LIMA to LAD, patent SVG to diagonal with 50% stenosis in the distal anastomosis, chronically occluded SVG to the right PDA and OM3.  The RCA stent was patent.  The only difference from prior catheterization was progression of ostial left circumflex stenosis to 60%.  She was treated medically.  The patient had significant depression last year  after the death of her husband.  She had acquired COVID-19 infection earlier this month and was sick for more than 2 weeks but did not require hospitalization.  She is feeling better now.  She did have some episodes of chest pain lasting for few minutes during that time.    Past Medical History:  Diagnosis Date  . Anemia    "after OHS in 01/2013" (05/25/2013)  . Anginal pain ([place])   . Anxiety   . Arthritis    "joints" (05/25/2013)  . At risk for falls   . Coronary artery disease    a. nl myoview ~ 2006;  b. 01/2013 s/p MI and CABG x 4 (VG->D2, VG->RPDA->OM3, LIMA->LAD;  c. 05/2013 Cath/PCI: LM nl, LAD 60-70p/m, D1 70p, LCX 174m, OM2 60p, RCA 50-60p/46m (3.0x33 Xience DES), VG->D2 60, VG->RPDA/OM3 100, LIMA->LAD nl w 70 dLAD, EF 50%.  . Depression   . Diabetes mellitus, type 2 ([place])    "dx'd 1992" (05/25/2013)  . Dyspnea    DOE  . Gait disturbance    uses walker some  . GERD (gastroesophageal reflux disease)   . Glaucoma    bilateral  . Headache(784.0)    "monthly" (05/25/2013)  . History of hiatal hernia   . History of pneumonia 1985  . Hyperlipidemia   . Hypertension   . Hypothyroidism   . IBS (irritable bowel syndrome)   . Lymphocytosis 03/24/2012  . Myocardial infarction ([place])    2014  . OA (osteoarthritis)   . PONV (postoperative nausea and vomiting)   .  Vertigo     Past Surgical History:  Procedure Laterality Date  . BLADDER SUSPENSION  2005  . CARDIAC CATHETERIZATION  01/2013  . CARPAL TUNNEL RELEASE Bilateral 1990-1992  . CATARACT EXTRACTION W/PHACO Left 08/17/2018   Procedure: CATARACT EXTRACTION PHACO AND INTRAOCULAR LENS PLACEMENT (IOC) LEFT, DIABETIC;  Surgeon: Birder Robson, MD;  Location: [facility];  Service: Ophthalmology;  Laterality: Left;  [place] 01:01 CDE 8.51 Fluid pack lot # [id number]  . CATARACT EXTRACTION W/PHACO Right 09/14/2018   Procedure: CATARACT EXTRACTION PHACO AND INTRAOCULAR LENS PLACEMENT (IOC) RIGHT, DIABETIC;  Surgeon: Birder Robson,  MD;  Location: [facility];  Service: Ophthalmology;  Laterality: Right;  [place]  01:39 CDE 15.79 Fluid pack lot # [id number]  . COLONOSCOPY WITH PROPOFOL N/A 11/20/2015   Procedure: COLONOSCOPY WITH PROPOFOL;  Surgeon: Lucilla Lame, MD;  Location: [facility];  Service: Endoscopy;  Laterality: N/A;  . CORONARY ANGIOPLASTY WITH STENT PLACEMENT  05/25/2013   "1" (05/25/2013)  . CORONARY ARTERY BYPASS GRAFT N/A 01/07/2013   Procedure: CORONARY ARTERY BYPASS GRAFTING (CABG);  Surgeon: Melrose Nakayama, MD;  Location: [place];  Service: Open Heart Surgery;  Laterality: N/A;  . ESOPHAGOGASTRODUODENOSCOPY (EGD) WITH PROPOFOL N/A 11/20/2015   Procedure: ESOPHAGOGASTRODUODENOSCOPY (EGD) WITH PROPOFOL;  Surgeon: Lucilla Lame, MD;  Location: [facility];  Service: Endoscopy;  Laterality: N/A;  . HAMMER TOE SURGERY Right 2009  . KNEE ARTHROSCOPY Bilateral 1993-1996  . LEFT HEART CATH AND CORS/GRAFTS ANGIOGRAPHY N/A 01/28/2019   Procedure: LEFT HEART CATH AND CORS/GRAFTS ANGIOGRAPHY;  Surgeon: Nelva Bush, MD;  Location: [facility];  Service: Cardiovascular;  Laterality: N/A;  . LEFT HEART CATHETERIZATION WITH CORONARY ANGIOGRAM N/A 01/05/2013   Procedure: LEFT HEART CATHETERIZATION WITH CORONARY ANGIOGRAM;  Surgeon: Wellington Hampshire, MD;  Location: [facility];  Service: Cardiovascular;  Laterality: N/A;  . LEFT HEART CATHETERIZATION WITH CORONARY/GRAFT ANGIOGRAM N/A 05/25/2013   Procedure: LEFT HEART CATHETERIZATION WITH Beatrix Fetters;  Surgeon: Wellington Hampshire, MD;  Location: [facility];  Service: Cardiovascular;  Laterality: N/A;  . PERCUTANEOUS CORONARY STENT INTERVENTION (PCI-S)  05/25/2013   Procedure: PERCUTANEOUS CORONARY STENT INTERVENTION (PCI-S);  Surgeon: Wellington Hampshire, MD;  Location: [facility];  Service: Cardiovascular;;  . THYROIDECTOMY  2010  . VAGINAL HYSTERECTOMY  1974     Current Outpatient Medications  Medication Sig Dispense Refill  . acetaminophen (TYLENOL  ARTHRITIS PAIN) 650 MG CR tablet Take 650 mg by mouth at bedtime.     Marland Kitchen acyclovir (ZOVIRAX) 400 MG tablet Take 400 mg by mouth 2 (two) times daily.    Marland Kitchen aspirin 81 MG tablet Take 1 tablet (81 mg total) by mouth daily.    . B-D INS SYRINGE 0.5CC/31GX5/16 31G X 5/16" 0.5 ML MISC daily. as directed  5  . B-D ULTRAFINE III SHORT PEN 31G X 8 MM MISC daily. as directed  5  . Carboxymethylcellulose Sodium (THERATEARS) 0.25 % SOLN Place 1 drop into both eyes 3 (three) times daily as needed (for dry/irritated eyes.).    Marland Kitchen clopidogrel (PLAVIX) 300 MG TABS tablet Take by mouth.    . clopidogrel (PLAVIX) 75 MG tablet TAKE 1 TABLET (75 MG TOTAL) BY MOUTH DAILY WITH BREAKFAST. 90 tablet 0  . Ganciclovir (ZIRGAN) 0.15 % GEL APPLY 1 A SMALL AMOUNT IN RIGHT EYE 5 TIMES A DAY UNTIL DIRECTED    . HUMALOG KWIKPEN 100 UNIT/ML KwikPen     . insulin aspart (NOVOLOG) 100 UNIT/ML injection Inject into the skin.    Marland Kitchen  latanoprost (XALATAN) 0.005 % ophthalmic solution Place 1 drop into both eyes at bedtime.     Marland Kitchen LEVEMIR 100 UNIT/ML injection Inject 20 Units into the skin at bedtime. Sliding scale  3  . levothyroxine (SYNTHROID, LEVOTHROID) 88 MCG tablet Take 88 mcg by mouth daily before breakfast.    . LORazepam (ATIVAN) 1 MG tablet Take 2.5 mg by mouth at bedtime.     . meclizine (ANTIVERT) 25 MG tablet Take 25 mg by mouth daily.    . metFORMIN (GLUCOPHAGE) 1000 MG tablet Take by mouth.    . metFORMIN (GLUCOPHAGE) 500 MG tablet Take 1,000 mg by mouth 2 (two) times daily.    . nitroGLYCERIN (NITROSTAT) 0.4 MG SL tablet PLACE 1 TABLET (0.4 MG TOTAL) UNDER THE TONGUE EVERY 5 (FIVE) MINUTES AS NEEDED FOR CHEST PAIN. 25 tablet 3  . ondansetron (ZOFRAN ODT) 4 MG disintegrating tablet Take 1 tablet (4 mg total) by mouth every 8 (eight) hours as needed for nausea or vomiting. 15 tablet 0  . ONE TOUCH ULTRA TEST test strip USE AS DIRECTED TO TEST BLOOD SUGAR 3 TIMES A DAY DX E11.51  3  . OXERVATE 0.002 % SOLN     . pantoprazole  (PROTONIX) 40 MG tablet Take 40 mg by mouth daily before breakfast.    . rosuvastatin (CRESTOR) 5 MG tablet TAKE 1 TABLET BY MOUTH EVERY DAY WITH BREAKFAST 90 tablet 0  . sertraline (ZOLOFT) 25 MG tablet Take 25 mg by mouth daily.    . sertraline (ZOLOFT) 50 MG tablet Take by mouth.    . valACYclovir (VALTREX) 1000 MG tablet Take 1,000 mg by mouth 3 (three) times daily.     No current facility-administered medications for this visit.    Allergies:   Amitriptyline, Atorvastatin, Cortisone, Macrodantin [nitrofurantoin], and Motrin [ibuprofen]    Social History:  The patient  reports that she has never smoked. She has never used smokeless tobacco. She reports that she does not drink alcohol and does not use drugs.   Family History:  The patient's family history includes Cancer in her mother; Diabetes in her son; Emphysema in her brother and father.    ROS:  Please see the history of present illness.   Otherwise, review of systems are positive for none.   All other systems are reviewed and negative.    PHYSICAL EXAM: VS:  BP (!) 156/74 (BP Location: Left Arm, Patient Position: Sitting, Cuff Size: Normal)   Pulse 76   Ht 5\' 3"  (1.6 m)   Wt 138 lb (62.6 kg)   SpO2 96%   BMI 24.45 kg/m  , BMI Body mass index is 24.45 kg/m. GEN: Well nourished, well developed, in no acute distress  HEENT: normal  Neck: no JVD, carotid bruits, or masses Cardiac: RRR; no murmurs, rubs, or gallops,no edema  Respiratory:  clear to auscultation bilaterally, normal work of breathing GI: soft, nontender, nondistended, + BS MS: no deformity or atrophy  Skin: warm and dry, no rash Neuro:  Strength and sensation are intact Psych: euthymic mood, full affect  EKG:  EKG is ordered today. The ekg ordered today demonstrates normal sinus rhythm with left anterior fascicular block  Recent Labs: No results found for requested labs within last 8760 hours.    Lipid Panel    Component Value Date/Time   CHOL 164  06/24/2013 1031   TRIG 143 06/24/2013 1031   HDL 49 06/24/2013 1031   CHOLHDL 3.3 06/24/2013 1031   CHOLHDL 6.2 01/06/2013 0245  VLDL 28 01/06/2013 0245   LDLCALC 86 06/24/2013 1031      Wt Readings from Last 3 Encounters:  04/26/20 138 lb (62.6 kg)  10/14/19 141 lb 8 oz (64.2 kg)  06/14/19 136 lb (61.7 kg)        ASSESSMENT AND PLAN:  1.  Coronary artery disease involving bypass graft without angina: She is overall doing well with no convincing symptoms of angina.  I recommend continuing medical therapy.  She continues to be on dual antiplatelet therapy given that she had possible small non-STEMI in June of last year but I might consider stopping Plavix in the near future.  2. Essential hypertension: Antihypertensive medications were discontinued due to concerns about dizziness.  Blood pressure is more elevated today.  I elected to add amlodipine 2.5 mg once daily.  3. Hyperlipidemia with intolerance to statins. She has been able to tolerate small dose rosuvastatin .   4.  Poor balance and dizziness: Being followed by neurology.    Disposition:   FU with me in 4 months   Signed, Kathlyn Sacramento, MD 04/26/20 [place], [place]

## 2020-04-27 ENCOUNTER — Other Ambulatory Visit: Payer: Self-pay

## 2020-04-27 ENCOUNTER — Other Ambulatory Visit: Payer: Self-pay | Admitting: Cardiovascular Disease

## 2020-04-27 MED ORDER — CLOPIDOGREL BISULFATE 75 MG PO TABS: 75.0000 mg | ORAL_TABLET | Freq: Every day | ORAL | 0 refills | Status: DC

## 2020-05-02 ENCOUNTER — Telehealth: Payer: Self-pay

## 2020-05-02 MED ORDER — AMLODIPINE BESYLATE 2.5 MG PO TABS: 5.0000 mg | ORAL_TABLET | Freq: Every day | ORAL | Status: DC

## 2020-05-02 NOTE — Telephone Encounter (Signed)
Patient was given Amlodipine 2.5MG   Patient c/o chest pain.   Pt c/o of Chest Pain: STAT if CP now or developed within 24 hours  1. Are you having CP right now? One really bad chest pain lasting 1-2 mins.   2. Are you experiencing any other symptoms (ex. SOB, nausea, vomiting, sweating)? "Its just sharpe pain and can not lay to the left."   3. How long have you been experiencing CP? This is the second one today.   4. Is your CP continuous or coming and going? The pain comes and goes.  5. Have you taken Nitroglycerin? No she has not.  ?  Please call her daughter at [phone] - Hilda Blades

## 2020-05-02 NOTE — Telephone Encounter (Signed)
I spoke with the patient's daughter, Hilda Blades (ok per DPR). She states the patient saw Dr. Fletcher Anon last week and he added amlodipine 2.5 mg once daily. She did not pick up and start this medication until Monday (9/27).  The patient has reported 2 episodes of stabbing chest pain today. These have been in the left breast area. The 1st occurred this morning ~ 10:30 am The 2nd occurred ~ 2:15 pm.  These episodes lasted ~ 1.5-2 minutes. When the patient had the 2nd episode, she had just come out of the bathroom and told her daughter she needed to lay down due to her pain. She states she laid on her left side and this hurt so bad, she had to lay on her right side.   They have not been checking her BP regularly. Hilda Blades did check this right before calling us this afternoon and she was 150/82 (81).   Symptoms are not similar to her prior cardiac event.   The patient reported to her daughter that she has felt better the last few days on the amlodipine until today.  She was diagnosed with COVID on 04/06/20 and just started feeling "more like herself" 3 days ago.   The patient is specifically wanting Dr. Tyrell Antonio opinion if she needs to increase the amlodipine to 5 mg once daily. I advised I do not feel as though her symptoms are emergent and require evaluation in the ED, but will review with Dr. Fletcher Anon and call them back with further recommendations. [name] voices understanding and is agreeable.

## 2020-05-02 NOTE — Telephone Encounter (Signed)
I spoke with Angela Barrett and advised her of Dr. Tyrell Antonio recommendations to increase amlodipine to 5 mg once daily. Angela Barrett is aware the patient may take amlodipine 2.5 mg- 2 tablets (5 mg) once daily. She is aware the patient may take an additional 2.5 mg of amlodipine this afternoon to make a total of 5 mg today.   I have asked Angela Barrett to monitor the patient's blood pressure for the next few days and update [place] as to how she is doing and we can send in thd higher dose of amlodipine 5 mg tablet to the pharmacy.  Angela Barrett has been advised if the patient's symptoms worsen or persist, she will need to proceed to the ER for further evaluation and possible treatment.  [name] voices understanding and is agreeable.

## 2020-05-02 NOTE — Telephone Encounter (Signed)
Lets increase amlodipine to 5 mg once daily and monitor symptoms.  If she has recurrent and frequent episodes of chest pain she should go to the ED for evaluation.

## 2020-05-07 DIAGNOSIS — Z794 Long term (current) use of insulin: Secondary | ICD-10-CM | POA: Diagnosis not present

## 2020-05-07 DIAGNOSIS — N1832 Chronic kidney disease, stage 3b: Secondary | ICD-10-CM | POA: Diagnosis not present

## 2020-05-07 DIAGNOSIS — S81802A Unspecified open wound, left lower leg, initial encounter: Secondary | ICD-10-CM | POA: Diagnosis not present

## 2020-05-07 DIAGNOSIS — E1129 Type 2 diabetes mellitus with other diabetic kidney complication: Secondary | ICD-10-CM | POA: Diagnosis not present

## 2020-05-07 DIAGNOSIS — R2689 Other abnormalities of gait and mobility: Secondary | ICD-10-CM | POA: Diagnosis not present

## 2020-05-07 DIAGNOSIS — F329 Major depressive disorder, single episode, unspecified: Secondary | ICD-10-CM | POA: Diagnosis not present

## 2020-05-09 MED ORDER — AMLODIPINE BESYLATE 5 MG PO TABS: 5.0000 mg | ORAL_TABLET | Freq: Every day | ORAL | 1 refills | Status: DC

## 2020-05-09 NOTE — Telephone Encounter (Signed)
Patients daughter calling in to state that the 5 mg amlodipine is working and she would like a 90 Rx sent in to [facility] on [address]

## 2020-05-09 NOTE — Telephone Encounter (Signed)
Noted. Rx for Amlodipine 5 mg daily #90 R-1 sent to the patients pharmacy.

## 2020-05-09 NOTE — Addendum Note (Signed)
Addended by: Lamar Laundry on: 05/09/2020 09:28 AM   Modules accepted: Orders

## 2020-05-10 DIAGNOSIS — Z85828 Personal history of other malignant neoplasm of skin: Secondary | ICD-10-CM | POA: Diagnosis not present

## 2020-05-10 DIAGNOSIS — C44729 Squamous cell carcinoma of skin of left lower limb, including hip: Secondary | ICD-10-CM | POA: Diagnosis not present

## 2020-05-17 DIAGNOSIS — N3000 Acute cystitis without hematuria: Secondary | ICD-10-CM | POA: Diagnosis not present

## 2020-05-17 DIAGNOSIS — R3911 Hesitancy of micturition: Secondary | ICD-10-CM | POA: Diagnosis not present

## 2020-05-17 DIAGNOSIS — R300 Dysuria: Secondary | ICD-10-CM | POA: Diagnosis not present

## 2020-05-17 DIAGNOSIS — R3915 Urgency of urination: Secondary | ICD-10-CM | POA: Diagnosis not present

## 2020-05-30 ENCOUNTER — Other Ambulatory Visit: Payer: Self-pay | Admitting: Cardiovascular Disease

## 2020-06-06 DIAGNOSIS — Z85828 Personal history of other malignant neoplasm of skin: Secondary | ICD-10-CM | POA: Diagnosis not present

## 2020-06-06 DIAGNOSIS — C44729 Squamous cell carcinoma of skin of left lower limb, including hip: Secondary | ICD-10-CM | POA: Diagnosis not present

## 2020-07-06 DIAGNOSIS — H401132 Primary open-angle glaucoma, bilateral, moderate stage: Secondary | ICD-10-CM | POA: Diagnosis not present

## 2020-07-17 IMAGING — MR MRI HEAD WITHOUT AND WITH CONTRAST
12 series · 48 of 48 positions shown · IV contrast (multihance)
Comparison: Previous MRI 06/22/2015.

CLINICAL DATA: Syncope, unspecified type. Frequent falls. Tremors
in both hands.

EXAM:
MRI HEAD WITHOUT AND WITH CONTRAST
TECHNIQUE: Multiplanar, multiecho pulse sequences of the brain and surrounding
structures were obtained without and with intravenous contrast.
CONTRAST:  11mL MULTIHANCE GADOBENATE DIMEGLUMINE 529 MG/ML IV SOLN
Creatinine was obtained on site at [HOSPITAL] at [HOSPITAL].
Results: Creatinine 1.2 mg/dL.

[Series 5: T1 · sagittal · 4.0mm · 0.75mm/px · 2 of 31 slices shown (1 of 3)]
[im 1/31]
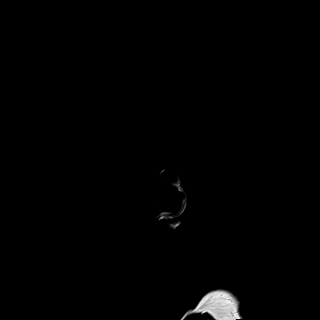
[im 31/31]
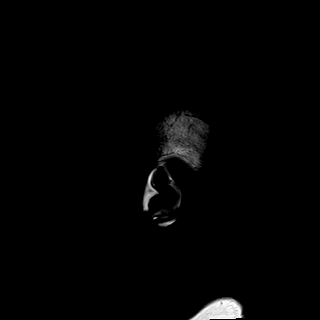

[Series 6: DWI · axial · 3.0mm · 1.44mm/px · z∈[-55,+93]mm · 5 of 92 slices shown (1 of 4)]
[im 1/92]
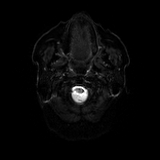
[im 23/92]
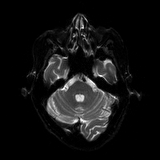
[im 46/92]
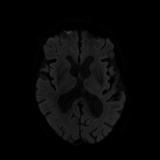
[im 69/92]
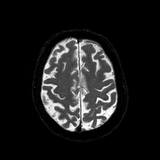
[im 92/92]
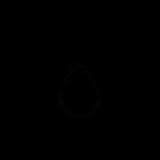

[Series 7: DWI · axial · 3.0mm · 1.44mm/px · z∈[-55,+93]mm · 3 of 46 slices shown (2 of 4)]
[im 1/46]
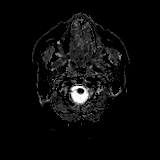
[im 23/46]
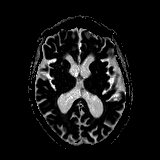
[im 46/46]
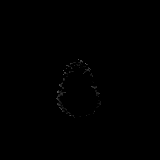

[Series 8: DWI · coronal · 5.0mm · 1.44mm/px · 4 of 66 slices shown (3 of 4)]
[im 1/66]
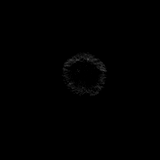
[im 22/66]
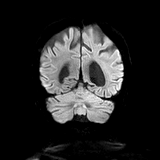
[im 44/66]
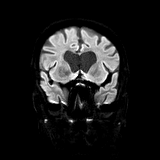
[im 66/66]
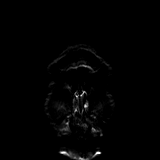

[Series 9: DWI · coronal · 5.0mm · 1.44mm/px · 2 of 32 slices shown (4 of 4)]
[im 1/32]
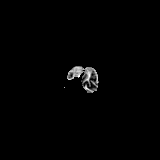
[im 32/32]
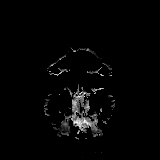

[Series 10: T2 · axial · 4.0mm · 0.36mm/px · z∈[-59,+96]mm · 2 of 31 slices shown]
[im 1/31]
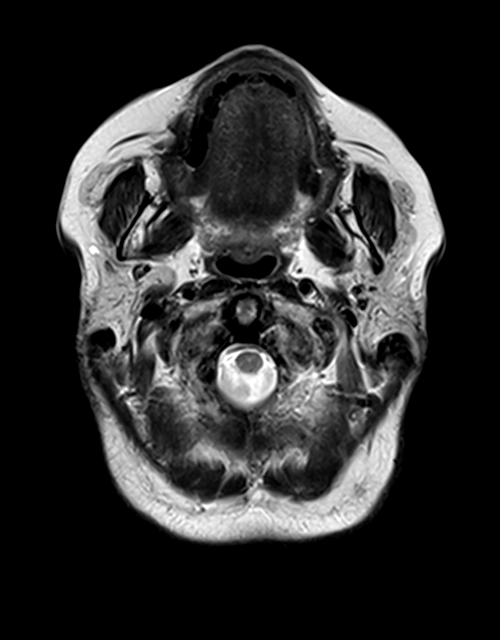
[im 31/31]
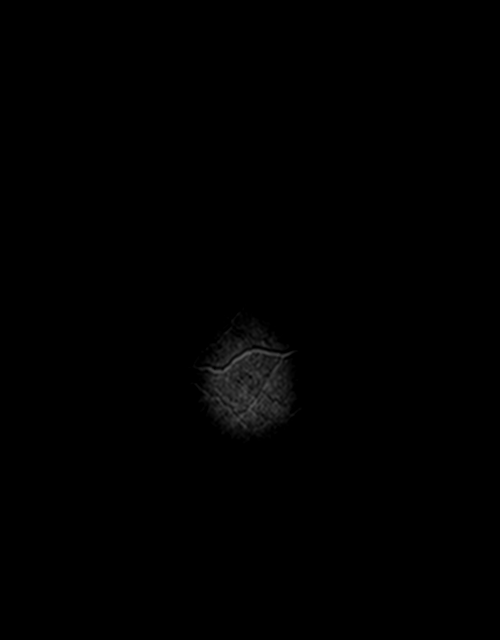

[Series 11: FLAIR · axial · 3.0mm · 0.72mm/px · z∈[-62,+99]mm · 2 of 28 slices shown]
[im 1/28]
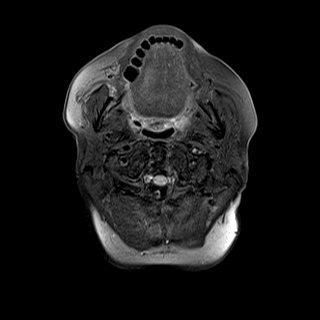
[im 28/28]
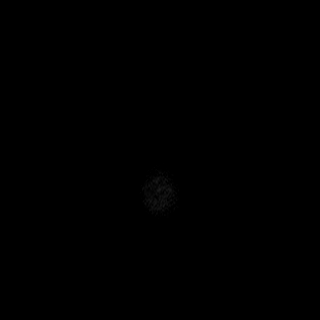

[Series 13: swi_images · axial · 1.5mm · 0.90mm/px · z∈[-58,+96]mm · 6 of 104 slices shown]
[im 1/104]
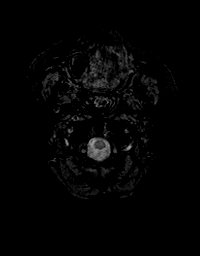
[im 21/104]
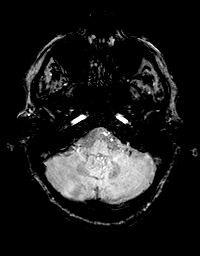
[im 42/104]
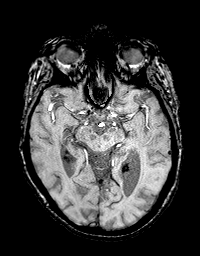
[im 62/104]
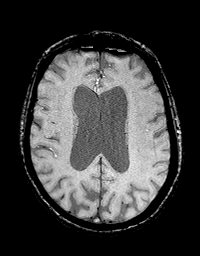
[im 83/104]
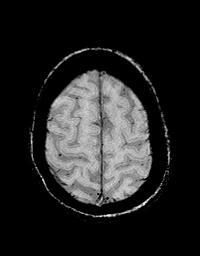
[im 104/104]
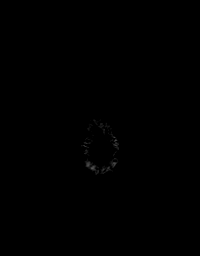

[Series 14: T1 · axial · 1.0mm · 0.90mm/px · z∈[-60,+98]mm · 9 of 160 slices shown (2 of 3)]
[im 1/160]
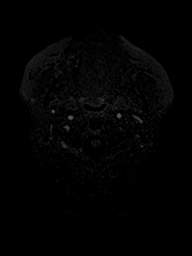
[im 20/160]
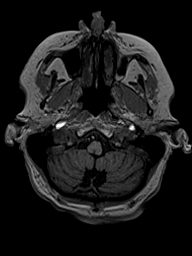
[im 40/160]
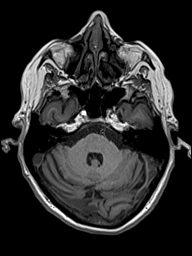
[im 60/160]
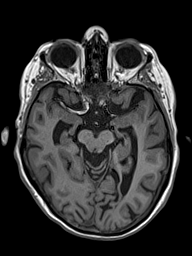
[im 80/160]
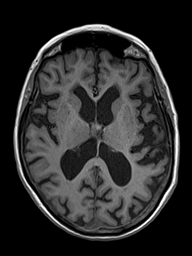
[im 100/160]
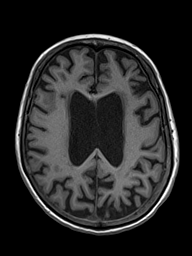
[im 120/160]
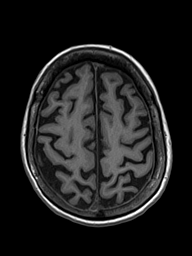
[im 140/160]
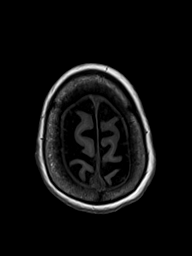
[im 160/160]
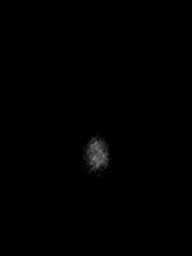

[Series 15: T2 post-contrast · coronal · 4.0mm · 0.36mm/px · 2 of 35 slices shown]
[im 1/35]
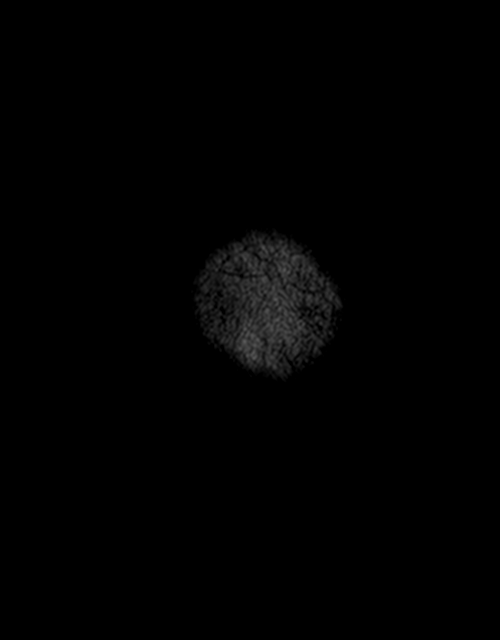
[im 35/35]
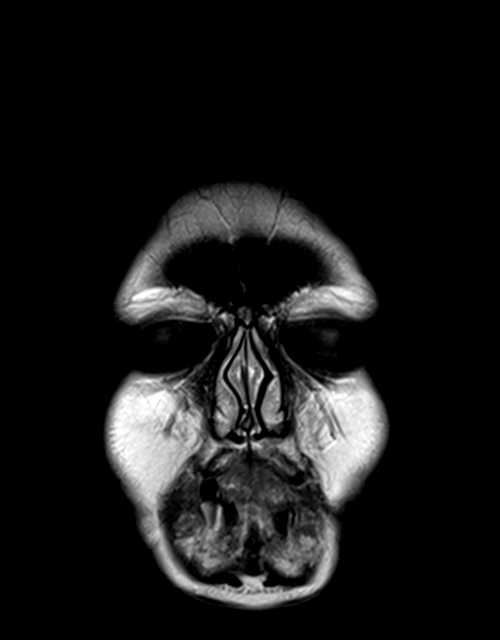

[Series 16: T1 · axial · 1.0mm · 0.90mm/px · z∈[-60,+98]mm · 9 of 160 slices shown (3 of 3)]
[im 1/160]
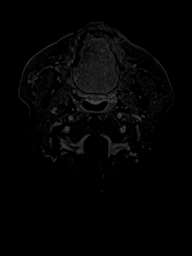
[im 20/160]
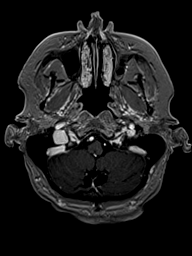
[im 40/160]
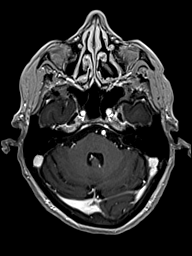
[im 60/160]
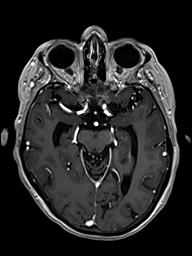
[im 80/160]
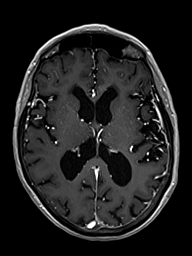
[im 100/160]
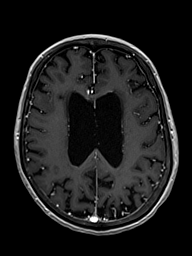
[im 120/160]
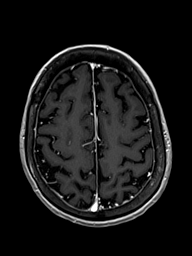
[im 140/160]
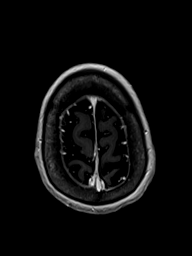
[im 160/160]
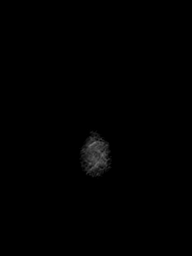

[Series 17: T1 post-contrast · coronal · 4.0mm · 0.72mm/px · 2 of 35 slices shown]
[im 1/35]
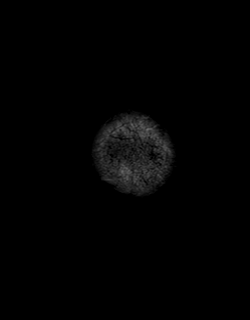
[im 35/35]
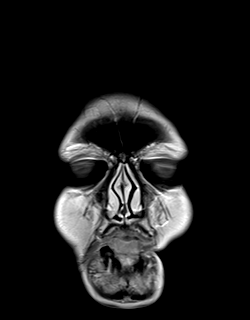

[48 of 48 positions shown; findings below may reference images not displayed]

FINDINGS: Brain: No acute infarction, hemorrhage, hydrocephalus, extra-axial
collection or mass lesion. Moderately advanced cerebral and
cerebellar atrophy. Moderate T2 and FLAIR hyperintensities in the
periventricular and subcortical white matter likely chronic
microvascular ischemic change related to diabetes and/or
hypertension.

Post infusion, no abnormal enhancement of the brain or meninges.

Vascular: Normal flow voids.  Major dural venous sinuses are patent.

Skull and upper cervical spine: Normal marrow signal.

Sinuses/Orbits: Negative.  BILATERAL cataract extraction.

Other: Compared with previous study, there is increasing atrophy and
ischemic demyelination the white matter.
IMPRESSION: Atrophy and small vessel disease, progressive from 3360. No acute
intracranial findings. No abnormal postcontrast enhancement.

## 2020-08-10 ENCOUNTER — Encounter: Payer: Self-pay | Admitting: Cardiovascular Disease

## 2020-08-10 ENCOUNTER — Telehealth (INDEPENDENT_AMBULATORY_CARE_PROVIDER_SITE_OTHER): Payer: Medicare Other | Admitting: Cardiovascular Disease

## 2020-08-10 VITALS — BP 141/74 | HR 76 | Ht 64.0 in | Wt 135.0 lb

## 2020-08-10 DIAGNOSIS — I10 Essential (primary) hypertension: Secondary | ICD-10-CM | POA: Diagnosis not present

## 2020-08-10 DIAGNOSIS — R2689 Other abnormalities of gait and mobility: Secondary | ICD-10-CM

## 2020-08-10 DIAGNOSIS — E782 Mixed hyperlipidemia: Secondary | ICD-10-CM

## 2020-08-10 DIAGNOSIS — R0600 Dyspnea, unspecified: Secondary | ICD-10-CM | POA: Diagnosis not present

## 2020-08-10 DIAGNOSIS — I25810 Atherosclerosis of coronary artery bypass graft(s) without angina pectoris: Secondary | ICD-10-CM

## 2020-08-10 DIAGNOSIS — R0609 Other forms of dyspnea: Secondary | ICD-10-CM

## 2020-08-10 NOTE — Progress Notes (Signed)
Virtual Visit via Video Note   This visit type was conducted due to national recommendations for restrictions regarding the COVID-19 Pandemic (e.g. social distancing) in an effort to limit this patient's exposure and mitigate transmission in our community.  Due to her co-morbid illnesses, this patient is at least at moderate risk for complications without adequate follow up.  This format is felt to be most appropriate for this patient at this time.  All issues noted in this document were discussed and addressed.  A limited physical exam was performed with this format.  Please refer to the patient's chart for her consent to telehealth for [facility].  Video Connection Lost Video connection was lost at > 50% of the duration of this visit, at which time the remainder of the visit was completed via audio only.      Date:  08/10/2020   ID:  Angela Barrett, DOB 08-20-1940, MRN [id number] The patient was identified using 2 identifiers.  Patient Location: Home Provider Location: Home Office  PCP:  Burnard Bunting, MD  Cardiologist:  No primary care provider on file.  Electrophysiologist:  None   Evaluation Performed:  Follow-Up Visit  Chief Complaint:  Exertional dyspnea  History of Present Illness:    Angela Barrett is a 80 y.o. female who was seen for a follow-up visit.  This started as a video visit but then had to convert to an audio visit due to poor sound quality. She has known history of diabetes and hypertension. She had CABG in June, 2014 after NSTEMI.  She had recurrent angina post CABG. Cardiac cath in 05/2013 showed significant 3 vessel CAD with diffuse diabetic vessels. SVG to RPDA/OM3 was found to be occluded. OM3 got collaterals from RCA. EF was low normal. I performed Successful PCI and DES placement to proximal and mid right coronary artery. Left circumflex occlusion was left to be treated medically.  She had recurrent syncope with no evidence of arrhythmia on outpatient  monitoring. She had syncope while she was wearing the monitor and she was noted to be in normal sinus rhythm at that time.  She was hospitalized in June of 2020 with weakness and vomiting without chest pain.  She was found to have mildly elevated troponin. Echocardiogram showed an EF of 45 to 50%.  Cardiac catheterization showed significant underlying three-vessel coronary artery disease with patent LIMA to LAD, patent SVG to diagonal with 50% stenosis in the distal anastomosis, chronically occluded SVG to the right PDA and OM3.  The RCA stent was patent.  The only difference from prior catheterization was progression of ostial left circumflex stenosis to 60%.  She was treated medically.  The patient had significant depression last year after the death of her husband.    She reports significant worsening of exertional dyspnea since her last visit.  She reports that this is happening now with any activities.  No chest pain or orthopnea.  She does report being more depressed around the anniversary of her husband's death but that seems to be improving now.  She lost another few pounds.      The patient does not have symptoms concerning for COVID-19 infection (fever, chills, cough, or new shortness of breath).    Past Medical History:  Diagnosis Date  . Anemia    "after OHS in 01/2013" (05/25/2013)  . Anginal pain ([place])   . Anxiety   . Arthritis    "joints" (05/25/2013)  . At risk for falls   . Coronary  artery disease    a. nl myoview ~ 2006;  b. 01/2013 s/p MI and CABG x 4 (VG->D2, VG->RPDA->OM3, LIMA->LAD;  c. 05/2013 Cath/PCI: LM nl, LAD 60-70p/m, D1 70p, LCX 161m, OM2 60p, RCA 50-60p/32m (3.0x33 Xience DES), VG->D2 60, VG->RPDA/OM3 100, LIMA->LAD nl w 70 dLAD, EF 50%.  . Depression   . Diabetes mellitus, type 2 ([place])    "dx'd 1992" (05/25/2013)  . Dyspnea    DOE  . Gait disturbance    uses walker some  . GERD (gastroesophageal reflux disease)   . Glaucoma    bilateral  .  Headache(784.0)    "monthly" (05/25/2013)  . History of hiatal hernia   . History of pneumonia 1985  . Hyperlipidemia   . Hypertension   . Hypothyroidism   . IBS (irritable bowel syndrome)   . Lymphocytosis 03/24/2012  . Myocardial infarction ([place])    2014  . OA (osteoarthritis)   . PONV (postoperative nausea and vomiting)   . Vertigo    Past Surgical History:  Procedure Laterality Date  . BLADDER SUSPENSION  2005  . CARDIAC CATHETERIZATION  01/2013  . CARPAL TUNNEL RELEASE Bilateral 1990-1992  . CATARACT EXTRACTION W/PHACO Left 08/17/2018   Procedure: CATARACT EXTRACTION PHACO AND INTRAOCULAR LENS PLACEMENT (IOC) LEFT, DIABETIC;  Surgeon: Birder Robson, MD;  Location: [facility];  Service: Ophthalmology;  Laterality: Left;  [place] 01:01 CDE 8.51 Fluid pack lot # [id number]  . CATARACT EXTRACTION W/PHACO Right 09/14/2018   Procedure: CATARACT EXTRACTION PHACO AND INTRAOCULAR LENS PLACEMENT (IOC) RIGHT, DIABETIC;  Surgeon: Birder Robson, MD;  Location: [facility];  Service: Ophthalmology;  Laterality: Right;  [place]  01:39 CDE 15.79 Fluid pack lot # [id number]  . COLONOSCOPY WITH PROPOFOL N/A 11/20/2015   Procedure: COLONOSCOPY WITH PROPOFOL;  Surgeon: Lucilla Lame, MD;  Location: [facility];  Service: Endoscopy;  Laterality: N/A;  . CORONARY ANGIOPLASTY WITH STENT PLACEMENT  05/25/2013   "1" (05/25/2013)  . CORONARY ARTERY BYPASS GRAFT N/A 01/07/2013   Procedure: CORONARY ARTERY BYPASS GRAFTING (CABG);  Surgeon: Melrose Nakayama, MD;  Location: [facility];  Service: Open Heart Surgery;  Laterality: N/A;  . ESOPHAGOGASTRODUODENOSCOPY (EGD) WITH PROPOFOL N/A 11/20/2015   Procedure: ESOPHAGOGASTRODUODENOSCOPY (EGD) WITH PROPOFOL;  Surgeon: Lucilla Lame, MD;  Location: [facility];  Service: Endoscopy;  Laterality: N/A;  . HAMMER TOE SURGERY Right 2009  . KNEE ARTHROSCOPY Bilateral 1993-1996  . LEFT HEART CATH AND CORS/GRAFTS ANGIOGRAPHY N/A 01/28/2019   Procedure: LEFT HEART CATH AND  CORS/GRAFTS ANGIOGRAPHY;  Surgeon: Nelva Bush, MD;  Location: [facility];  Service: Cardiovascular;  Laterality: N/A;  . LEFT HEART CATHETERIZATION WITH CORONARY ANGIOGRAM N/A 01/05/2013   Procedure: LEFT HEART CATHETERIZATION WITH CORONARY ANGIOGRAM;  Surgeon: Wellington Hampshire, MD;  Location: [facility];  Service: Cardiovascular;  Laterality: N/A;  . LEFT HEART CATHETERIZATION WITH CORONARY/GRAFT ANGIOGRAM N/A 05/25/2013   Procedure: LEFT HEART CATHETERIZATION WITH Beatrix Fetters;  Surgeon: Wellington Hampshire, MD;  Location: [facility];  Service: Cardiovascular;  Laterality: N/A;  . PERCUTANEOUS CORONARY STENT INTERVENTION (PCI-S)  05/25/2013   Procedure: PERCUTANEOUS CORONARY STENT INTERVENTION (PCI-S);  Surgeon: Wellington Hampshire, MD;  Location: [facility];  Service: Cardiovascular;;  . THYROIDECTOMY  2010  . VAGINAL HYSTERECTOMY  1974     Current Meds  Medication Sig  . acetaminophen (TYLENOL) 650 MG CR tablet Take 650 mg by mouth at bedtime.  Marland Kitchen acyclovir (ZOVIRAX) 400 MG tablet Take 400 mg by mouth 2 (two) times daily.  Marland Kitchen  amLODipine (NORVASC) 5 MG tablet Take 1 tablet (5 mg total) by mouth daily.  Marland Kitchen aspirin 81 MG tablet Take 1 tablet (81 mg total) by mouth daily.  . B-D INS SYRINGE 0.5CC/31GX5/16 31G X 5/16" 0.5 ML MISC daily. as directed  . B-D ULTRAFINE III SHORT PEN 31G X 8 MM MISC daily. as directed  . Carboxymethylcellulose Sodium 0.25 % SOLN Place 1 drop into both eyes 3 (three) times daily as needed (for dry/irritated eyes.).  Marland Kitchen clopidogrel (PLAVIX) 75 MG tablet TAKE 1 TABLET (75 MG TOTAL) BY MOUTH DAILY WITH BREAKFAST.  Marland Kitchen Ganciclovir (ZIRGAN) 0.15 % GEL APPLY 1 A SMALL AMOUNT IN RIGHT EYE 5 TIMES A DAY UNTIL DIRECTED  . HUMALOG KWIKPEN 100 UNIT/ML KwikPen   . insulin aspart (NOVOLOG) 100 UNIT/ML injection Inject into the skin.  Marland Kitchen latanoprost (XALATAN) 0.005 % ophthalmic solution Place 1 drop into both eyes at bedtime.   Marland Kitchen LEVEMIR 100 UNIT/ML injection  Inject 20 Units into the skin at bedtime. Sliding scale  . levothyroxine (SYNTHROID, LEVOTHROID) 88 MCG tablet Take 88 mcg by mouth daily before breakfast.  . LORazepam (ATIVAN) 1 MG tablet Take 2.5 mg by mouth at bedtime.   . meclizine (ANTIVERT) 25 MG tablet Take 25 mg by mouth daily as needed.  . metFORMIN (GLUCOPHAGE) 500 MG tablet Take 1,000 mg by mouth 2 (two) times daily.  . nitroGLYCERIN (NITROSTAT) 0.4 MG SL tablet PLACE 1 TABLET (0.4 MG TOTAL) UNDER THE TONGUE EVERY 5 (FIVE) MINUTES AS NEEDED FOR CHEST PAIN.  Marland Kitchen ondansetron (ZOFRAN ODT) 4 MG disintegrating tablet Take 1 tablet (4 mg total) by mouth every 8 (eight) hours as needed for nausea or vomiting.  . ONE TOUCH ULTRA TEST test strip USE AS DIRECTED TO TEST BLOOD SUGAR 3 TIMES A DAY DX E11.51  . OXERVATE 0.002 % SOLN   . pantoprazole (PROTONIX) 40 MG tablet Take 40 mg by mouth daily before breakfast.  . rosuvastatin (CRESTOR) 5 MG tablet TAKE 1 TABLET BY MOUTH EVERY DAY WITH BREAKFAST  . sertraline (ZOLOFT) 25 MG tablet Take 25 mg by mouth daily.  . sertraline (ZOLOFT) 50 MG tablet Take by mouth.  . valACYclovir (VALTREX) 1000 MG tablet Take 1,000 mg by mouth 3 (three) times daily.     Allergies:   Amitriptyline, Atorvastatin, Cortisone, Hydrocortisone, Macrodantin [nitrofurantoin], and Motrin [ibuprofen]   Social History   Tobacco Use  . Smoking status: Never Smoker  . Smokeless tobacco: Never Used  Substance Use Topics  . Alcohol use: No  . Drug use: No     Family Hx: The patient's family history includes Cancer in her mother; Diabetes in her son; Emphysema in her brother and father.  ROS:   Please see the history of present illness.     All other systems reviewed and are negative.   Prior CV studies:   The following studies were reviewed today:    Labs/Other Tests and Data Reviewed:    EKG:  No ECG reviewed.  Recent Labs: No results found for requested labs within last 8760 hours.   Recent Lipid  Panel Lab Results  Component Value Date/Time   CHOL 164 06/24/2013 10:31 AM   TRIG 143 06/24/2013 10:31 AM   HDL 49 06/24/2013 10:31 AM   CHOLHDL 3.3 06/24/2013 10:31 AM   CHOLHDL 6.2 01/06/2013 02:45 AM   LDLCALC 86 06/24/2013 10:31 AM    Wt Readings from Last 3 Encounters:  08/10/20 135 lb (61.2 kg)  04/26/20 138 lb (62.6  kg)  10/14/19 141 lb 8 oz (64.2 kg)     Risk Assessment/Calculations:      Objective:    Vital Signs:  BP (!) 141/74   Pulse 76   Ht 5\' 4"  (1.626 m)   Wt 135 lb (61.2 kg)   SpO2 96%   BMI 23.17 kg/m    VITAL SIGNS:  reviewed  ASSESSMENT & PLAN:    1.  Coronary artery disease involving bypass graft with other forms of angina: The patient reports significant worsening of exertional dyspnea currently happening with minimal exertion and she seems to be frustrated.  I think we have to exclude underlying metabolic abnormalities and thus I am going to check routine labs and check BNP as well.  She does have underlying coronary artery disease and there is the possibility of progression of her graft disease.  I requested a Lexiscan Myoview and will also obtain an echocardiogram to evaluate diastolic function and pulmonary pressure.  Follow-up after cardiac testing.    2. Essential hypertension: Blood pressure improved after resuming amlodipine.  We will avoid dropping blood pressure too much given recurrent episodes of dizziness.  3. Hyperlipidemia with intolerance to statins. She has been able to tolerate small dose rosuvastatin .  Check lipid and liver profile.  4.  Poor balance and dizziness: Being followed by neurology.   Shared Decision Making/Informed Consent The risks [chest pain, shortness of breath, cardiac arrhythmias, dizziness, blood pressure fluctuations, myocardial infarction, stroke/transient ischemic attack, nausea, vomiting, allergic reaction, radiation exposure, metallic taste sensation and life-threatening complications (estimated to be 1 in  10,000)], benefits (risk stratification, diagnosing coronary artery disease, treatment guidance) and alternatives of a nuclear stress test were discussed in detail with Angela Barrett and she agrees to proceed.       COVID-19 Education: The signs and symptoms of COVID-19 were discussed with the patient and how to seek care for testing (follow up with PCP or arrange E-visit).  The importance of social distancing was discussed today.  Time:   Today, I have spent 10 minutes with the patient with telehealth technology discussing the above problems.     Medication Adjustments/Labs and Tests Ordered: Current medicines are reviewed at length with the patient today.  Concerns regarding medicines are outlined above.   Tests Ordered: No orders of the defined types were placed in this encounter.   Medication Changes: No orders of the defined types were placed in this encounter.   Follow Up:  In Person in 1 month(s)  Signed, Kathlyn Sacramento, MD  08/10/2020 10:51 AM    [place]

## 2020-08-10 NOTE — Patient Instructions (Signed)
Medication Instructions:  Your physician recommends that you continue on your current medications as directed. Please refer to the Current Medication list given to you today.  *If you need a refill on your cardiac medications before your next appointment, please call your pharmacy*   Lab Work: Your physician recommends that you return for a FASTING lipid profile, cmp, cbc,bnp. Please have your labs drawn asap at the [facility] medical mall lab. You do not need an appointment. Lab hours are Mon-Fri 7am-6pm.  If you have labs (blood work) drawn today and your tests are completely normal, you will receive your results only by: Marland Kitchen MyChart Message (if you have MyChart) OR . A paper copy in the mail If you have any lab test that is abnormal or we need to change your treatment, we will call you to review the results.   Testing/Procedures: Your physician has requested that you have an echocardiogram. Echocardiography is a painless test that uses sound waves to create images of your heart. It provides your doctor with information about the size and shape of your heart and how well your heart's chambers and valves are working. This procedure takes approximately one hour. There are no restrictions for this procedure.  Your physician has requested that you have a lexiscan myoview. For further information please visit [email or web address]. Please follow instruction sheet, as given.     Follow-Up: At [facility], you and your health needs are our priority.  As part of our continuing mission to provide you with exceptional heart care, we have created designated Provider Care Teams.  These Care Teams include your primary Cardiologist (physician) and Advanced Practice Providers (APPs -  Physician Assistants and Nurse Practitioners) who all work together to provide you with the care you need, when you need it.  We recommend signing up for the patient portal called "MyChart".  Sign up information is provided on  this After Visit Summary.  MyChart is used to connect with patients for Virtual Visits (Telemedicine).  Patients are able to view lab/test results, encounter notes, upcoming appointments, etc.  Non-urgent messages can be sent to your provider as well.   To learn more about what you can do with MyChart, go to [email or web address].    Your next appointment:   4 week(s)  The format for your next appointment:   In Person  Provider:   You may see  Dr. Fletcher Anon or one of the following Advanced Practice Providers on your designated Care Team:    Murray Hodgkins, NP  Christell Faith, PA-C  Marrianne Mood, PA-C  Cadence [place], [place]  Laurann [place], NP    Other Instructions [place]  Your caregiver has ordered a Stress Test with nuclear imaging. The purpose of this test is to evaluate the blood supply to your heart muscle. This procedure is referred to as a "Non-Invasive Stress Test." This is because other than having an IV started in your vein, nothing is inserted or "invades" your body. Cardiac stress tests are done to find areas of poor blood flow to the heart by determining the extent of coronary artery disease (CAD). Some patients exercise on a treadmill, which naturally increases the blood flow to your heart, while others who are  unable to walk on a treadmill due to physical limitations have a pharmacologic/chemical stress agent called Lexiscan . This medicine will mimic walking on a treadmill by temporarily increasing your coronary blood flow.   Please note: these test may take anywhere between 2-4 hours to complete  PLEASE REPORT TO [facility]TR[facility]  THE VOLUNTEERS AT THE FIRST DESK WILL DIRECT YOU WHERE TO GO  Date of Procedure:_____________________________________  Arrival Time for Procedure:______________________________  Instructions regarding medication:   __X__ : Hold diabetes medication morning of procedure  PLEASE NOTIFY THE OFFICE AT LEAST 24 HOURS IN  ADVANCE IF YOU ARE UNABLE TO KEEP YOUR APPOINTMENT.  [phone] AND  PLEASE NOTIFY NUCLEAR MEDICINE AT [facility] AT LEAST 24 HOURS IN ADVANCE IF YOU ARE UNABLE TO KEEP YOUR APPOINTMENT. [phone]  How to prepare for your Myoview test:  1. Do not eat or drink after midnight 2. No caffeine for 24 hours prior to test 3. No smoking 24 hours prior to test. 4. Your medication may be taken with water.  If your doctor stopped a medication because of this test, do not take that medication. 5. Ladies, please do not wear dresses.  Skirts or pants are appropriate. Please wear a short sleeve shirt. 6. No perfume or lotion. 7. Wear comfortable walking shoes. No heels!

## 2020-08-17 ENCOUNTER — Encounter
Admission: RE | Admit: 2020-08-17 | Discharge: 2020-08-17 | Disposition: A | Discharge status: Home / Self Care | Payer: Medicare Other | Source: Ambulatory Visit | Attending: Cardiovascular Disease | Admitting: Cardiovascular Disease

## 2020-08-17 ENCOUNTER — Other Ambulatory Visit
Admission: RE | Admit: 2020-08-17 | Discharge: 2020-08-17 | Disposition: A | Discharge status: Home / Self Care | Payer: Medicare Other | Source: Home / Self Care | Attending: Cardiovascular Disease | Admitting: Cardiovascular Disease

## 2020-08-17 ENCOUNTER — Other Ambulatory Visit: Payer: Self-pay

## 2020-08-17 DIAGNOSIS — E782 Mixed hyperlipidemia: Secondary | ICD-10-CM

## 2020-08-17 DIAGNOSIS — I10 Essential (primary) hypertension: Secondary | ICD-10-CM | POA: Insufficient documentation

## 2020-08-17 DIAGNOSIS — R0600 Dyspnea, unspecified: Secondary | ICD-10-CM

## 2020-08-17 DIAGNOSIS — I25810 Atherosclerosis of coronary artery bypass graft(s) without angina pectoris: Secondary | ICD-10-CM

## 2020-08-17 DIAGNOSIS — R0609 Other forms of dyspnea: Secondary | ICD-10-CM

## 2020-08-17 LAB — CBC WITH DIFFERENTIAL/PLATELET
Abs Immature Granulocytes: 0.01 10*3/uL (ref 0.00–0.07)
Basophils Absolute: 0 10*3/uL (ref 0.0–0.1)
Basophils Relative: 0 %
Eosinophils Absolute: 0.6 10*3/uL — ABNORMAL HIGH (ref 0.0–0.5)
Eosinophils Relative: 7 %
HCT: 34.1 % — ABNORMAL LOW (ref 36.0–46.0)
Hemoglobin: 11.2 g/dL — ABNORMAL LOW (ref 12.0–15.0)
Immature Granulocytes: 0 %
Lymphocytes Relative: 37 %
Lymphs Abs: 3.1 10*3/uL (ref 0.7–4.0)
MCH: 30.6 pg (ref 26.0–34.0)
MCHC: 32.8 g/dL (ref 30.0–36.0)
MCV: 93.2 fL (ref 80.0–100.0)
Monocytes Absolute: 0.7 10*3/uL (ref 0.1–1.0)
Monocytes Relative: 8 %
Neutro Abs: 4 10*3/uL (ref 1.7–7.7)
Neutrophils Relative %: 48 %
Platelets: 350 10*3/uL (ref 150–400)
RBC: 3.66 MIL/uL — ABNORMAL LOW (ref 3.87–5.11)
RDW: 13.2 % (ref 11.5–15.5)
WBC: 8.3 10*3/uL (ref 4.0–10.5)
nRBC: 0 % (ref 0.0–0.2)

## 2020-08-17 LAB — COMPREHENSIVE METABOLIC PANEL
ALT: 14 U/L (ref 0–44)
AST: 19 U/L (ref 15–41)
Albumin: 4.5 g/dL (ref 3.5–5.0)
Alkaline Phosphatase: 51 U/L (ref 38–126)
Anion gap: 16 — ABNORMAL HIGH (ref 5–15)
BUN: 18 mg/dL (ref 8–23)
CO2: 21 mmol/L — ABNORMAL LOW (ref 22–32)
Calcium: 9.6 mg/dL (ref 8.9–10.3)
Chloride: 103 mmol/L (ref 98–111)
Creatinine, Ser: 1.13 mg/dL — ABNORMAL HIGH (ref 0.44–1.00)
GFR, Estimated: 49 mL/min — ABNORMAL LOW (ref >60)
Glucose, Bld: 199 mg/dL — ABNORMAL HIGH (ref 70–99)
Potassium: 4.8 mmol/L (ref 3.5–5.1)
Sodium: 140 mmol/L (ref 135–145)
Total Bilirubin: 0.7 mg/dL (ref 0.3–1.2)
Total Protein: 8.2 g/dL — ABNORMAL HIGH (ref 6.5–8.1)

## 2020-08-17 LAB — LIPID PANEL
Cholesterol: 138 mg/dL (ref 0–200)
HDL: 48 mg/dL (ref >40)
LDL Cholesterol: 60 mg/dL (ref 0–99)
Total CHOL/HDL Ratio: 2.9 RATIO
Triglycerides: 148 mg/dL (ref <150)
VLDL: 30 mg/dL (ref 0–40)

## 2020-08-17 LAB — NM MYOCAR MULTI W/SPECT W/WALL MOTION / EF
Estimated workload: 1 METS
Exercise duration (min): 0 min
Exercise duration (sec): 0 s
LV dias vol: 54 mL (ref 46–106)
LV sys vol: 22 mL
MPHR: 141 {beats}/min
Peak HR: 92 {beats}/min
Percent HR: 65 %
Rest HR: 71 {beats}/min
SDS: 4
SRS: 0
SSS: 1
TID: 0.9

## 2020-08-17 LAB — BRAIN NATRIURETIC PEPTIDE: B Natriuretic Peptide: 94.9 pg/mL (ref 0.0–100.0)

## 2020-08-17 MED ORDER — TECHNETIUM TC 99M TETROFOSMIN IV KIT
30.0000 | PACK | Freq: Once | INTRAVENOUS | Status: AC | PRN
  Administered 2020-08-17: 30.97 via INTRAVENOUS

## 2020-08-17 MED ORDER — TECHNETIUM TC 99M TETROFOSMIN IV KIT
10.7000 | PACK | Freq: Once | INTRAVENOUS | Status: AC | PRN
  Administered 2020-08-17: 10.7 via INTRAVENOUS

## 2020-08-17 MED ORDER — REGADENOSON 0.4 MG/5ML IV SOLN
0.4000 mg | Freq: Once | INTRAVENOUS | Status: AC
  Administered 2020-08-17: 0.4 mg via INTRAVENOUS
  Filled 2020-08-17: qty 5

## 2020-08-20 ENCOUNTER — Other Ambulatory Visit: Payer: Medicare Other

## 2020-08-30 ENCOUNTER — Encounter: Payer: Self-pay | Admitting: Cardiovascular Disease

## 2020-08-30 ENCOUNTER — Ambulatory Visit (INDEPENDENT_AMBULATORY_CARE_PROVIDER_SITE_OTHER): Payer: Medicare Other | Admitting: Cardiovascular Disease

## 2020-08-30 ENCOUNTER — Other Ambulatory Visit: Payer: Self-pay

## 2020-08-30 ENCOUNTER — Ambulatory Visit (INDEPENDENT_AMBULATORY_CARE_PROVIDER_SITE_OTHER): Payer: Medicare Other

## 2020-08-30 VITALS — BP 130/60 | HR 74 | Ht 64.0 in | Wt 140.4 lb

## 2020-08-30 DIAGNOSIS — E785 Hyperlipidemia, unspecified: Secondary | ICD-10-CM

## 2020-08-30 DIAGNOSIS — I25810 Atherosclerosis of coronary artery bypass graft(s) without angina pectoris: Secondary | ICD-10-CM

## 2020-08-30 DIAGNOSIS — R0600 Dyspnea, unspecified: Secondary | ICD-10-CM

## 2020-08-30 DIAGNOSIS — I25708 Atherosclerosis of coronary artery bypass graft(s), unspecified, with other forms of angina pectoris: Secondary | ICD-10-CM

## 2020-08-30 DIAGNOSIS — I10 Essential (primary) hypertension: Secondary | ICD-10-CM | POA: Diagnosis not present

## 2020-08-30 DIAGNOSIS — R2689 Other abnormalities of gait and mobility: Secondary | ICD-10-CM | POA: Diagnosis not present

## 2020-08-30 DIAGNOSIS — R0609 Other forms of dyspnea: Secondary | ICD-10-CM

## 2020-08-30 LAB — ECHOCARDIOGRAM COMPLETE
AR max vel: 2.1 cm2
AV Area VTI: 2.21 cm2
AV Area mean vel: 2.23 cm2
AV Mean grad: 4 mmHg
AV Peak grad: 7.2 mmHg
Ao pk vel: 1.34 m/s
Area-P 1/2: 2.94 cm2
Calc EF: 56.7 %
S' Lateral: 2.8 cm
Single Plane A2C EF: 56.8 %
Single Plane A4C EF: 55.2 %

## 2020-08-30 NOTE — Progress Notes (Signed)
Cardiology Office Note   Date:  08/30/2020   ID:  Angela Barrett, Angela Barrett Aug 25, 1940, MRN [id number]  PCP:  Burnard Bunting, MD  Cardiologist:  Kathlyn Sacramento, MD   Chief Complaint  Patient presents with  . Follow-up    Myoview 08/17/2020 and Echo 08/30/2020 results. Patient c/o shortness of breath. Medications reviewed by the patient verbally.       History of Present Illness: Angela Barrett is a 80 y.o. female who presents for a followup regarding coronary artery disease status post CABG and PCI.  She has known history of diabetes and hypertension. She had CABG in June, 2014 after NSTEMI.  She had recurrent angina post CABG. Cardiac cath in 05/2013 showed significant 3 vessel CAD with diffuse diabetic vessels. SVG to RPDA/OM3 was found to be occluded. OM3 got collaterals from RCA. EF was low normal. I performed Successful PCI and DES placement to proximal and mid right coronary artery. Left circumflex occlusion was left to be treated medically. Most recent nuclear stress test in January 2016 was normal. Most recent echocardiogram in August 2016 showed normal LV systolic function, mild to moderate mitral regurgitation and mild pulmonary hypertension.   She had recurrent syncope with no evidence of arrhythmia on outpatient monitoring. She had syncope while she was wearing the monitor and she was noted to be in normal sinus rhythm at that time.  She was hospitalized in June of 2020 with weakness and vomiting without chest pain.  She was found to have mildly elevated troponin. Echocardiogram showed an EF of 45 to 50%.  Cardiac catheterization showed significant underlying three-vessel coronary artery disease with patent LIMA to LAD, patent SVG to diagonal with 50% stenosis in the distal anastomosis, chronically occluded SVG to the right PDA and OM3.  The RCA stent was patent.  The only difference from prior catheterization was progression of ostial left circumflex stenosis to 60%.  She was treated  medically.  The patient had significant depression last year after the death of her husband.    She was seen via video visit earlier this month and reported worsening exertional dyspnea.  Thus, I schedule her for [facility] which showed no evidence of ischemia with normal ejection fraction.  I also did routine labs which showed some improvement in her anemia and stable renal function.  She had an echocardiogram done today which was personally reviewed by me.  It showed normal LV systolic function with mild mitral and tricuspid regurgitation.  No evidence of pulmonary hypertension.  She feels better after she heard the results.  She reports mostly that she is limited by exertional dyspnea but has not been doing any regular exercise.  Past Medical History:  Diagnosis Date  . Anemia    "after OHS in 01/2013" (05/25/2013)  . Anginal pain ([place])   . Anxiety   . Arthritis    "joints" (05/25/2013)  . At risk for falls   . Coronary artery disease    a. nl myoview ~ 2006;  b. 01/2013 s/p MI and CABG x 4 (VG->D2, VG->RPDA->OM3, LIMA->LAD;  c. 05/2013 Cath/PCI: LM nl, LAD 60-70p/m, D1 70p, LCX 120m, OM2 60p, RCA 50-60p/15m (3.0x33 Xience DES), VG->D2 60, VG->RPDA/OM3 100, LIMA->LAD nl w 70 dLAD, EF 50%.  . Depression   . Diabetes mellitus, type 2 ([place])    "dx'd 1992" (05/25/2013)  . Dyspnea    DOE  . Gait disturbance    uses walker some  . GERD (gastroesophageal reflux disease)   .  Glaucoma    bilateral  . Headache(784.0)    "monthly" (05/25/2013)  . History of hiatal hernia   . History of pneumonia 1985  . Hyperlipidemia   . Hypertension   . Hypothyroidism   . IBS (irritable bowel syndrome)   . Lymphocytosis 03/24/2012  . Myocardial infarction ([place])    2014  . OA (osteoarthritis)   . PONV (postoperative nausea and vomiting)   . Vertigo     Past Surgical History:  Procedure Laterality Date  . BLADDER SUSPENSION  2005  . CARDIAC CATHETERIZATION  01/2013  . CARPAL TUNNEL  RELEASE Bilateral 1990-1992  . CATARACT EXTRACTION W/PHACO Left 08/17/2018   Procedure: CATARACT EXTRACTION PHACO AND INTRAOCULAR LENS PLACEMENT (IOC) LEFT, DIABETIC;  Surgeon: Birder Robson, MD;  Location: [facility];  Service: Ophthalmology;  Laterality: Left;  [place] 01:01 CDE 8.51 Fluid pack lot # [id number]  . CATARACT EXTRACTION W/PHACO Right 09/14/2018   Procedure: CATARACT EXTRACTION PHACO AND INTRAOCULAR LENS PLACEMENT (IOC) RIGHT, DIABETIC;  Surgeon: Birder Robson, MD;  Location: [facility];  Service: Ophthalmology;  Laterality: Right;  [place]  01:39 CDE 15.79 Fluid pack lot # [id number]  . COLONOSCOPY WITH PROPOFOL N/A 11/20/2015   Procedure: COLONOSCOPY WITH PROPOFOL;  Surgeon: Lucilla Lame, MD;  Location: [facility];  Service: Endoscopy;  Laterality: N/A;  . CORONARY ANGIOPLASTY WITH STENT PLACEMENT  05/25/2013   "1" (05/25/2013)  . CORONARY ARTERY BYPASS GRAFT N/A 01/07/2013   Procedure: CORONARY ARTERY BYPASS GRAFTING (CABG);  Surgeon: Melrose Nakayama, MD;  Location: [place];  Service: Open Heart Surgery;  Laterality: N/A;  . ESOPHAGOGASTRODUODENOSCOPY (EGD) WITH PROPOFOL N/A 11/20/2015   Procedure: ESOPHAGOGASTRODUODENOSCOPY (EGD) WITH PROPOFOL;  Surgeon: Lucilla Lame, MD;  Location: [facility];  Service: Endoscopy;  Laterality: N/A;  . HAMMER TOE SURGERY Right 2009  . KNEE ARTHROSCOPY Bilateral 1993-1996  . LEFT HEART CATH AND CORS/GRAFTS ANGIOGRAPHY N/A 01/28/2019   Procedure: LEFT HEART CATH AND CORS/GRAFTS ANGIOGRAPHY;  Surgeon: Nelva Bush, MD;  Location: [place] [facility];  Service: Cardiovascular;  Laterality: N/A;  . LEFT HEART CATHETERIZATION WITH CORONARY ANGIOGRAM N/A 01/05/2013   Procedure: LEFT HEART CATHETERIZATION WITH CORONARY ANGIOGRAM;  Surgeon: Wellington Hampshire, MD;  Location: [place] CATH LAB;  Service: Cardiovascular;  Laterality: N/A;  . LEFT HEART CATHETERIZATION WITH CORONARY/GRAFT ANGIOGRAM N/A 05/25/2013   Procedure: LEFT HEART CATHETERIZATION WITH  Beatrix Fetters;  Surgeon: Wellington Hampshire, MD;  Location: [place] CATH LAB;  Service: Cardiovascular;  Laterality: N/A;  . PERCUTANEOUS CORONARY STENT INTERVENTION (PCI-S)  05/25/2013   Procedure: PERCUTANEOUS CORONARY STENT INTERVENTION (PCI-S);  Surgeon: Wellington Hampshire, MD;  Location: [facility] C[facility] LAB;  Service: Cardiovascular;;  . THYROIDECTOMY  2010  . VAGINAL HYSTERECTOMY  1974     Current Outpatient Medications  Medication Sig Dispense Refill  . acetaminophen (TYLENOL) 650 MG CR tablet Take 650 mg by mouth at bedtime.    Marland Kitchen acyclovir (ZOVIRAX) 400 MG tablet Take 400 mg by mouth 2 (two) times daily.    Marland Kitchen amLODipine (NORVASC) 5 MG tablet Take 1 tablet (5 mg total) by mouth daily. 90 tablet 1  . aspirin 81 MG tablet Take 1 tablet (81 mg total) by mouth daily.    . B-D INS SYRINGE 0.5CC/31GX5/16 31G X 5/16" 0.5 ML MISC daily. as directed  5  . B-D ULTRAFINE III SHORT PEN 31G X 8 MM MISC daily. as directed  5  . Carboxymethylcellulose Sodium 0.25 % SOLN Place 1 drop into both eyes 3 (three) times daily as  needed (for dry/irritated eyes.).    Marland Kitchen clopidogrel (PLAVIX) 75 MG tablet TAKE 1 TABLET (75 MG TOTAL) BY MOUTH DAILY WITH BREAKFAST. 90 tablet 0  . Ganciclovir (ZIRGAN) 0.15 % GEL APPLY 1 A SMALL AMOUNT IN RIGHT EYE 5 TIMES A DAY UNTIL DIRECTED    . HUMALOG KWIKPEN 100 UNIT/ML KwikPen Inject 10 Units into the skin daily.    Marland Kitchen latanoprost (XALATAN) 0.005 % ophthalmic solution Place 1 drop into both eyes at bedtime.     Marland Kitchen LEVEMIR 100 UNIT/ML injection Inject 20 Units into the skin at bedtime. Sliding scale  3  . levothyroxine (SYNTHROID, LEVOTHROID) 88 MCG tablet Take 88 mcg by mouth daily before breakfast.    . LORazepam (ATIVAN) 1 MG tablet Take 2.5 mg by mouth at bedtime.     . meclizine (ANTIVERT) 25 MG tablet Take 25 mg by mouth daily as needed.    . metFORMIN (GLUCOPHAGE) 500 MG tablet Take 1,000 mg by mouth 2 (two) times daily.    . nitroGLYCERIN (NITROSTAT) 0.4 MG SL tablet PLACE 1  TABLET (0.4 MG TOTAL) UNDER THE TONGUE EVERY 5 (FIVE) MINUTES AS NEEDED FOR CHEST PAIN. 25 tablet 3  . ondansetron (ZOFRAN ODT) 4 MG disintegrating tablet Take 1 tablet (4 mg total) by mouth every 8 (eight) hours as needed for nausea or vomiting. 15 tablet 0  . ONE TOUCH ULTRA TEST test strip USE AS DIRECTED TO TEST BLOOD SUGAR 3 TIMES A DAY DX E11.51  3  . OXERVATE 0.002 % SOLN     . pantoprazole (PROTONIX) 40 MG tablet Take 40 mg by mouth daily before breakfast.    . rosuvastatin (CRESTOR) 5 MG tablet TAKE 1 TABLET BY MOUTH EVERY DAY WITH BREAKFAST 90 tablet 0  . sertraline (ZOLOFT) 25 MG tablet Take 25 mg by mouth daily.    . sertraline (ZOLOFT) 50 MG tablet Take by mouth.    . insulin aspart (NOVOLOG) 100 UNIT/ML injection Inject into the skin. (Patient not taking: Reported on 08/30/2020)     No current facility-administered medications for this visit.    Allergies:   Amitriptyline, Atorvastatin, Cortisone, Hydrocortisone, Macrodantin [nitrofurantoin], and Motrin [ibuprofen]    Social History:  The patient  reports that she has never smoked. She has never used smokeless tobacco. She reports that she does not drink alcohol and does not use drugs.   Family History:  The patient's family history includes Cancer in her mother; Diabetes in her son; Emphysema in her brother and father.    ROS:  Please see the history of present illness.   Otherwise, review of systems are positive for none.   All other systems are reviewed and negative.    PHYSICAL EXAM: VS:  BP 130/60 (BP Location: Left Arm, Patient Position: Sitting, Cuff Size: Normal)   Pulse 74   Ht 5\' 4"  (1.626 m)   Wt 140 lb 6 oz (63.7 kg)   SpO2 98%   BMI 24.10 kg/m  , BMI Body mass index is 24.1 kg/m. GEN: Well nourished, well developed, in no acute distress  HEENT: normal  Neck: no JVD, carotid bruits, or masses Cardiac: RRR; no murmurs, rubs, or gallops,no edema  Respiratory:  clear to auscultation bilaterally, normal work  of breathing GI: soft, nontender, nondistended, + BS MS: no deformity or atrophy  Skin: warm and dry, no rash Neuro:  Strength and sensation are intact Psych: euthymic mood, full affect  EKG:  EKG is not ordered today.    Recent  Labs: 08/17/2020: ALT 14; B Natriuretic Peptide 94.9; BUN 18; Creatinine, Ser 1.13; Hemoglobin 11.2; Platelets 350; Potassium 4.8; Sodium 140    Lipid Panel    Component Value Date/Time   CHOL 138 08/17/2020 1203   CHOL 164 06/24/2013 1031   TRIG 148 08/17/2020 1203   HDL 48 08/17/2020 1203   HDL 49 06/24/2013 1031   CHOLHDL 2.9 08/17/2020 1203   VLDL 30 08/17/2020 1203   LDLCALC 60 08/17/2020 1203   LDLCALC 86 06/24/2013 1031      Wt Readings from Last 3 Encounters:  08/30/20 140 lb 6 oz (63.7 kg)  08/10/20 135 lb (61.2 kg)  04/26/20 138 lb (62.6 kg)        ASSESSMENT AND PLAN:  1.  Coronary artery disease involving bypass graft with other forms of angina: She is doing reasonably well overall recent sharp chest pain is likely musculoskeletal.  Exertional dyspnea likely due to physical deconditioning.  Recent nuclear stress test was normal.  Echocardiogram showed no evidence of pulmonary hypertension.  Continue medical therapy. I advised her to start an exercise program.  She has a stationary bike that she is going to start using.  2. Essential hypertension: Blood pressure is controlled on amlodipine.  3. Hyperlipidemia with intolerance to statins. She has been able to tolerate small dose rosuvastatin .  Recent lipid profile showed an LDL of 60 and triglyceride of 148.  4.  Poor balance and dizziness: Being followed by neurology.    Disposition:   FU with me in 6 months   Signed, Kathlyn Sacramento, MD 08/30/20 [place], [place]

## 2020-08-30 NOTE — Patient Instructions (Signed)

## 2020-09-03 DIAGNOSIS — M3501 Sicca syndrome with keratoconjunctivitis: Secondary | ICD-10-CM | POA: Diagnosis not present

## 2020-09-10 DIAGNOSIS — E042 Nontoxic multinodular goiter: Secondary | ICD-10-CM | POA: Diagnosis not present

## 2020-09-10 DIAGNOSIS — K219 Gastro-esophageal reflux disease without esophagitis: Secondary | ICD-10-CM | POA: Diagnosis not present

## 2020-09-10 DIAGNOSIS — I2510 Atherosclerotic heart disease of native coronary artery without angina pectoris: Secondary | ICD-10-CM | POA: Diagnosis not present

## 2020-09-10 DIAGNOSIS — I700 Atherosclerosis of aorta: Secondary | ICD-10-CM | POA: Diagnosis not present

## 2020-09-10 DIAGNOSIS — N1832 Chronic kidney disease, stage 3b: Secondary | ICD-10-CM | POA: Diagnosis not present

## 2020-09-10 DIAGNOSIS — E1129 Type 2 diabetes mellitus with other diabetic kidney complication: Secondary | ICD-10-CM | POA: Diagnosis not present

## 2020-09-10 DIAGNOSIS — I129 Hypertensive chronic kidney disease with stage 1 through stage 4 chronic kidney disease, or unspecified chronic kidney disease: Secondary | ICD-10-CM | POA: Diagnosis not present

## 2020-09-10 DIAGNOSIS — Z794 Long term (current) use of insulin: Secondary | ICD-10-CM | POA: Diagnosis not present

## 2020-09-10 DIAGNOSIS — E1151 Type 2 diabetes mellitus with diabetic peripheral angiopathy without gangrene: Secondary | ICD-10-CM | POA: Diagnosis not present

## 2020-09-10 DIAGNOSIS — E785 Hyperlipidemia, unspecified: Secondary | ICD-10-CM | POA: Diagnosis not present

## 2020-09-10 DIAGNOSIS — E890 Postprocedural hypothyroidism: Secondary | ICD-10-CM | POA: Diagnosis not present

## 2020-09-10 DIAGNOSIS — F329 Major depressive disorder, single episode, unspecified: Secondary | ICD-10-CM | POA: Diagnosis not present

## 2020-09-11 ENCOUNTER — Ambulatory Visit: Payer: Medicare Other | Admitting: Cardiovascular Disease

## 2020-09-12 ENCOUNTER — Other Ambulatory Visit: Payer: Self-pay | Admitting: Cardiovascular Disease

## 2020-09-19 IMAGING — DX PORTABLE CHEST - 1 VIEW
1 series · 1 of 1 positions shown · non-contrast
Comparison: Portable exam 1698 hours compared to 08/21/2014

CLINICAL DATA: Weakness since this morning, vomiting since this
morning, history coronary artery disease post MI and CABG,
hypertension

EXAM:
PORTABLE CHEST 1 VIEW

[chest ap]
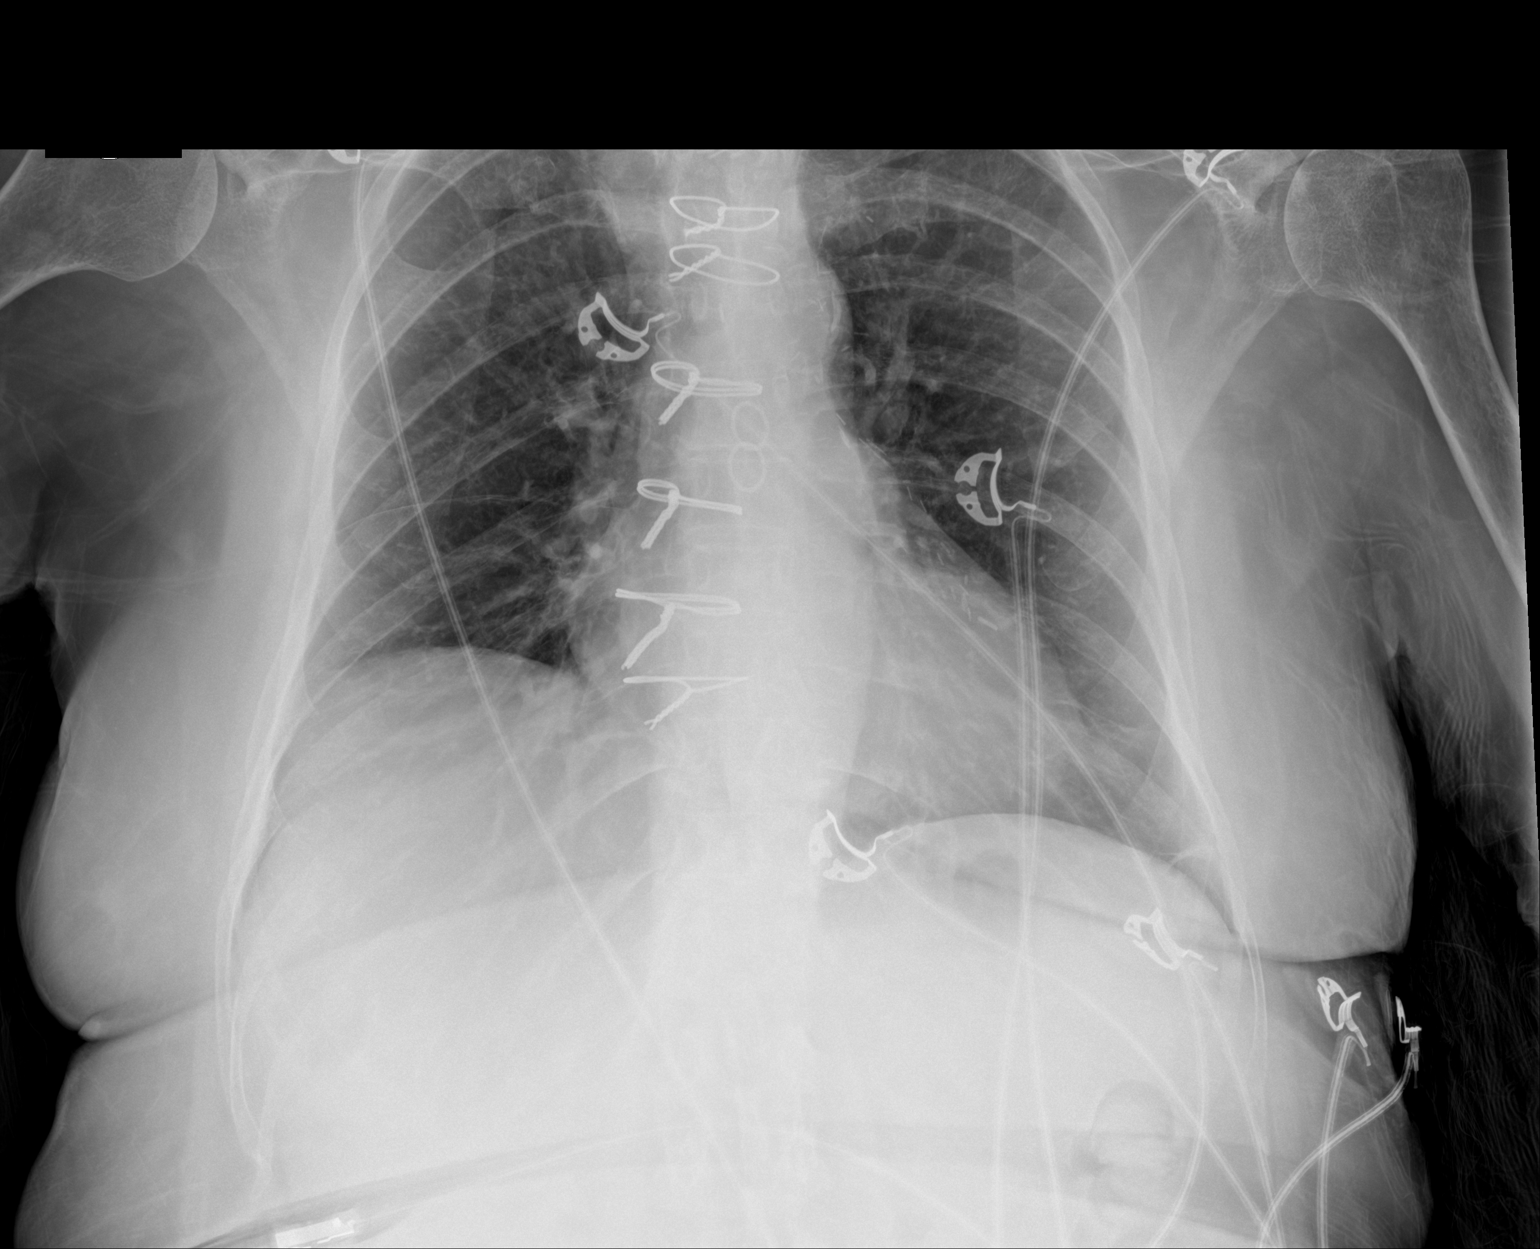

[1 of 1 positions shown; findings below may reference images not displayed]

FINDINGS: Upper normal size of cardiac silhouette post CABG.

Mediastinal contours and pulmonary vascularity normal.

Atherosclerotic calcification aorta.

Minimal scarring at LEFT costophrenic angle.

Lungs otherwise clear.

No infiltrate, pleural effusion, or pneumothorax.

Mild elevation of RIGHT diaphragm.
IMPRESSION: Post CABG.

No acute abnormalities.

## 2020-09-26 DIAGNOSIS — H168 Other keratitis: Secondary | ICD-10-CM | POA: Diagnosis not present

## 2020-12-18 DIAGNOSIS — D225 Melanocytic nevi of trunk: Secondary | ICD-10-CM | POA: Diagnosis not present

## 2020-12-18 DIAGNOSIS — L814 Other melanin hyperpigmentation: Secondary | ICD-10-CM | POA: Diagnosis not present

## 2020-12-18 DIAGNOSIS — D692 Other nonthrombocytopenic purpura: Secondary | ICD-10-CM | POA: Diagnosis not present

## 2020-12-18 DIAGNOSIS — Z85828 Personal history of other malignant neoplasm of skin: Secondary | ICD-10-CM | POA: Diagnosis not present

## 2020-12-18 DIAGNOSIS — D485 Neoplasm of uncertain behavior of skin: Secondary | ICD-10-CM | POA: Diagnosis not present

## 2020-12-18 DIAGNOSIS — L821 Other seborrheic keratosis: Secondary | ICD-10-CM | POA: Diagnosis not present

## 2021-01-03 DIAGNOSIS — H401132 Primary open-angle glaucoma, bilateral, moderate stage: Secondary | ICD-10-CM | POA: Diagnosis not present

## 2021-01-07 DIAGNOSIS — N1832 Chronic kidney disease, stage 3b: Secondary | ICD-10-CM | POA: Diagnosis not present

## 2021-01-07 DIAGNOSIS — Z1339 Encounter for screening examination for other mental health and behavioral disorders: Secondary | ICD-10-CM | POA: Diagnosis not present

## 2021-01-07 DIAGNOSIS — I700 Atherosclerosis of aorta: Secondary | ICD-10-CM | POA: Diagnosis not present

## 2021-01-07 DIAGNOSIS — E785 Hyperlipidemia, unspecified: Secondary | ICD-10-CM | POA: Diagnosis not present

## 2021-01-07 DIAGNOSIS — I129 Hypertensive chronic kidney disease with stage 1 through stage 4 chronic kidney disease, or unspecified chronic kidney disease: Secondary | ICD-10-CM | POA: Diagnosis not present

## 2021-01-07 DIAGNOSIS — Z0000 Encounter for general adult medical examination without abnormal findings: Secondary | ICD-10-CM | POA: Diagnosis not present

## 2021-01-07 DIAGNOSIS — K219 Gastro-esophageal reflux disease without esophagitis: Secondary | ICD-10-CM | POA: Diagnosis not present

## 2021-01-07 DIAGNOSIS — Z1331 Encounter for screening for depression: Secondary | ICD-10-CM | POA: Diagnosis not present

## 2021-01-07 DIAGNOSIS — E1151 Type 2 diabetes mellitus with diabetic peripheral angiopathy without gangrene: Secondary | ICD-10-CM | POA: Diagnosis not present

## 2021-01-07 DIAGNOSIS — M1990 Unspecified osteoarthritis, unspecified site: Secondary | ICD-10-CM | POA: Diagnosis not present

## 2021-01-07 DIAGNOSIS — Z794 Long term (current) use of insulin: Secondary | ICD-10-CM | POA: Diagnosis not present

## 2021-02-08 DIAGNOSIS — Z20822 Contact with and (suspected) exposure to covid-19: Secondary | ICD-10-CM | POA: Diagnosis not present

## 2021-03-07 ENCOUNTER — Ambulatory Visit: Payer: Medicare Other | Admitting: Cardiovascular Disease

## 2021-03-08 ENCOUNTER — Other Ambulatory Visit: Payer: Self-pay | Admitting: Cardiovascular Disease

## 2021-03-21 ENCOUNTER — Other Ambulatory Visit: Payer: Self-pay

## 2021-03-21 ENCOUNTER — Encounter: Payer: Self-pay | Admitting: Cardiovascular Disease

## 2021-03-21 ENCOUNTER — Ambulatory Visit (INDEPENDENT_AMBULATORY_CARE_PROVIDER_SITE_OTHER): Payer: Medicare Other | Admitting: Cardiovascular Disease

## 2021-03-21 VITALS — BP 138/60 | HR 73 | Ht 63.0 in | Wt 140.1 lb

## 2021-03-21 DIAGNOSIS — I25708 Atherosclerosis of coronary artery bypass graft(s), unspecified, with other forms of angina pectoris: Secondary | ICD-10-CM

## 2021-03-21 DIAGNOSIS — I10 Essential (primary) hypertension: Secondary | ICD-10-CM

## 2021-03-21 DIAGNOSIS — E785 Hyperlipidemia, unspecified: Secondary | ICD-10-CM | POA: Diagnosis not present

## 2021-03-21 DIAGNOSIS — I25810 Atherosclerosis of coronary artery bypass graft(s) without angina pectoris: Secondary | ICD-10-CM

## 2021-03-21 NOTE — Patient Instructions (Signed)

## 2021-03-21 NOTE — Progress Notes (Signed)
Cardiology Office Note   Date:  03/21/2021   ID:  Angela Barrett, Angela Barrett 1940-09-03, MRN [id number]  PCP:  Burnard Bunting, MD  Cardiologist:  Kathlyn Sacramento, MD   Chief Complaint  Patient presents with   Other    6 month f/u c/o cramping in the ankles and coughing up phelgm. Meds reviewed verbally with pt.       History of Present Illness: Angela Barrett is a 80 y.o. female who presents for a followup regarding coronary artery disease status post CABG and PCI.  She has known history of diabetes and hypertension. She had CABG in June, 2014 after NSTEMI.  She had recurrent angina post CABG. Cardiac cath in 05/2013 showed significant 3 vessel CAD with diffuse diabetic vessels. SVG to RPDA/OM3 was found to be occluded. OM3 got collaterals from RCA. EF was low normal. I performed Successful PCI and DES placement to proximal and mid right coronary artery. Left circumflex occlusion was left to be treated medically. Most recent nuclear stress test in January 2016 was normal.  She had recurrent syncope with no evidence of arrhythmia on outpatient monitoring. She had syncope while she was wearing the monitor and she was noted to be in normal sinus rhythm at that time.  Most recent cardiac catheterization in June 2020 showed significant underlying three-vessel coronary artery disease with patent LIMA to LAD, patent SVG to diagonal with 50% stenosis in the distal anastomosis, chronically occluded SVG to the right PDA and OM3.  The RCA stent was patent.  The only difference from prior catheterization was progression of ostial left circumflex stenosis to 60%.  She was treated medically. She had worsening exertional dyspnea early this year.  Lexiscan Myoview done in January showed no evidence of ischemia with normal ejection fraction.  Echocardiogram showed normal EF with no significant valvular abnormalities.  She has been doing well with no recent chest pain or shortness of breath.  Her dizziness  improved.  She had her 80th birthday yesterday.   Past Medical History:  Diagnosis Date   Anemia    "after OHS in 01/2013" (05/25/2013)   Anginal pain ([place])    Anxiety    Arthritis    "joints" (05/25/2013)   At risk for falls    Coronary artery disease    a. nl myoview ~ 2006;  b. 01/2013 s/p MI and CABG x 4 (VG->D2, VG->RPDA->OM3, LIMA->LAD;  c. 05/2013 Cath/PCI: LM nl, LAD 60-70p/m, D1 70p, LCX 11m OM2 60p, RCA 50-60p/812m3.0x33 Xience DES), VG->D2 60, VG->RPDA/OM3 100, LIMA->LAD nl w 70 dLAD, EF 50%.   Depression    Diabetes mellitus, type 2 ([place]   "dx'd 1992" (05/25/2013)   Dyspnea    DOE   Gait disturbance    uses walker some   GERD (gastroesophageal reflux disease)    Glaucoma    bilateral   Headache(784.0)    "monthly" (05/25/2013)   History of hiatal hernia    History of pneumonia 1985   Hyperlipidemia    Hypertension    Hypothyroidism    IBS (irritable bowel syndrome)    Lymphocytosis 03/24/2012   [place]   OA (osteoarthritis)    PONV (postoperative nausea and vomiting)    Vertigo     Past Surgical History:  Procedure Laterality Date   BLADDER SUSPENSION  2005   CARDIAC CATHETERIZATION  01/2013   CARPAL TUNNEL RELEASE Bilateral 1990-1992   CATARACT EXTRACTION W/PHACO Left 08/17/2018  Procedure: CATARACT EXTRACTION PHACO AND INTRAOCULAR LENS PLACEMENT (IOC) LEFT, DIABETIC;  Surgeon: Birder Robson, MD;  Location: [facility];  Service: Ophthalmology;  Laterality: Left;  [place] 01:01 CDE 8.51 Fluid pack lot # [id number]   CATARACT EXTRACTION W/PHACO Right 09/14/2018   Procedure: CATARACT EXTRACTION PHACO AND INTRAOCULAR LENS PLACEMENT (IOC) RIGHT, DIABETIC;  Surgeon: Birder Robson, MD;  Location: [facility];  Service: Ophthalmology;  Laterality: Right;  [place]  01:39 CDE 15.79 Fluid pack lot # [id number]   COLONOSCOPY WITH PROPOFOL N/A 11/20/2015   Procedure: COLONOSCOPY WITH PROPOFOL;  Surgeon: Lucilla Lame, MD;  Location: [facility];   Service: Endoscopy;  Laterality: N/A;   CORONARY ANGIOPLASTY WITH STENT PLACEMENT  05/25/2013   "1" (05/25/2013)   CORONARY ARTERY BYPASS GRAFT N/A 01/07/2013   Procedure: CORONARY ARTERY BYPASS GRAFTING (CABG);  Surgeon: Melrose Nakayama, MD;  Location: [facility];  Service: Open Heart Surgery;  Laterality: N/A;   ESOPHAGOGASTRODUODENOSCOPY (EGD) WITH PROPOFOL N/A 11/20/2015   Procedure: ESOPHAGOGASTRODUODENOSCOPY (EGD) WITH PROPOFOL;  Surgeon: Lucilla Lame, MD;  Location: [facility];  Service: Endoscopy;  Laterality: N/A;   HAMMER TOE SURGERY Right 2009   KNEE ARTHROSCOPY Bilateral 1993-1996   LEFT HEART CATH AND CORS/GRAFTS ANGIOGRAPHY N/A 01/28/2019   Procedure: LEFT HEART CATH AND CORS/GRAFTS ANGIOGRAPHY;  Surgeon: Nelva Bush, MD;  Location: [place] [facility];  Service: Cardiovascular;  Laterality: N/A;   LEFT HEART CATHETERIZATION WITH CORONARY ANGIOGRAM N/A 01/05/2013   Procedure: LEFT HEART CATHETERIZATION WITH CORONARY ANGIOGRAM;  Surgeon: Wellington Hampshire, MD;  Location: [facility] CATH LAB;  Service: Cardiovascular;  Laterality: N/A;   LEFT HEART CATHETERIZATION WITH CORONARY/GRAFT ANGIOGRAM N/A 05/25/2013   Procedure: LEFT HEART CATHETERIZATION WITH Beatrix Fetters;  Surgeon: Wellington Hampshire, MD;  Location: [place] CATH LAB;  Service: Cardiovascular;  Laterality: N/A;   PERCUTANEOUS CORONARY STENT INTERVENTION (PCI-S)  05/25/2013   Procedure: PERCUTANEOUS CORONARY STENT INTERVENTION (PCI-S);  Surgeon: Wellington Hampshire, MD;  Location: [facility] C[facility] LAB;  Service: Cardiovascular;;   THYROIDECTOMY  2010   VAGINAL HYSTERECTOMY  1974     Current Outpatient Medications  Medication Sig Dispense Refill   acetaminophen (TYLENOL) 650 MG CR tablet Take 650 mg by mouth at bedtime.     acyclovir (ZOVIRAX) 400 MG tablet Take 400 mg by mouth 2 (two) times daily.     amLODipine (NORVASC) 5 MG tablet TAKE 1 TABLET BY MOUTH EVERY DAY 90 tablet 1   aspirin 81 MG tablet Take 1 tablet (81 mg total) by  mouth daily.     B-D INS SYRINGE 0.5CC/31GX5/16 31G X 5/16" 0.5 ML MISC daily. as directed  5   B-D ULTRAFINE III SHORT PEN 31G X 8 MM MISC daily. as directed  5   Carboxymethylcellulose Sodium 0.25 % SOLN Place 1 drop into both eyes 3 (three) times daily as needed (for dry/irritated eyes.).     clopidogrel (PLAVIX) 75 MG tablet TAKE 1 TABLET BY MOUTH EVERY DAY WITH BREAKFAST 90 tablet 0   Ganciclovir (ZIRGAN) 0.15 % GEL APPLY 1 A SMALL AMOUNT IN RIGHT EYE 5 TIMES A DAY UNTIL DIRECTED     HUMALOG KWIKPEN 100 UNIT/ML KwikPen Inject 10 Units into the skin daily.     insulin aspart (NOVOLOG) 100 UNIT/ML injection Inject into the skin. (Patient not taking: Reported on 08/30/2020)     latanoprost (XALATAN) 0.005 % ophthalmic solution Place 1 drop into both eyes at bedtime.      LEVEMIR 100 UNIT/ML injection Inject 20 Units into the skin  at bedtime. Sliding scale  3   levothyroxine (SYNTHROID, LEVOTHROID) 88 MCG tablet Take 88 mcg by mouth daily before breakfast.     LORazepam (ATIVAN) 1 MG tablet Take 2.5 mg by mouth at bedtime.      meclizine (ANTIVERT) 25 MG tablet Take 25 mg by mouth daily as needed.     metFORMIN (GLUCOPHAGE) 500 MG tablet Take 1,000 mg by mouth 2 (two) times daily.     nitroGLYCERIN (NITROSTAT) 0.4 MG SL tablet PLACE 1 TABLET (0.4 MG TOTAL) UNDER THE TONGUE EVERY 5 (FIVE) MINUTES AS NEEDED FOR CHEST PAIN. 25 tablet 3   ondansetron (ZOFRAN ODT) 4 MG disintegrating tablet Take 1 tablet (4 mg total) by mouth every 8 (eight) hours as needed for nausea or vomiting. 15 tablet 0   ONE TOUCH ULTRA TEST test strip USE AS DIRECTED TO TEST BLOOD SUGAR 3 TIMES A DAY DX E11.51  3   OXERVATE 0.002 % SOLN      pantoprazole (PROTONIX) 40 MG tablet Take 40 mg by mouth daily before breakfast.     rosuvastatin (CRESTOR) 5 MG tablet TAKE 1 TABLET BY MOUTH EVERY DAY WITH BREAKFAST 90 tablet 0   sertraline (ZOLOFT) 25 MG tablet Take 25 mg by mouth daily.     sertraline (ZOLOFT) 50 MG tablet Take by  mouth.     No current facility-administered medications for this visit.    Allergies:   Amitriptyline, Atorvastatin, Cortisone, Hydrocortisone, Macrodantin [nitrofurantoin], and Motrin [ibuprofen]    Social History:  The patient  reports that she has never smoked. She has never used smokeless tobacco. She reports that she does not drink alcohol and does not use drugs.   Family History:  The patient's family history includes Cancer in her mother; Diabetes in her son; Emphysema in her brother and father.    ROS:  Please see the history of present illness.   Otherwise, review of systems are positive for none.   All other systems are reviewed and negative.    PHYSICAL EXAM: VS:  BP 138/60 (BP Location: Left Arm, Patient Position: Sitting, Cuff Size: Normal)   Pulse 73   Ht '5\' 3"'$  (1.6 m)   Wt 140 lb 2 oz (63.6 kg)   SpO2 98%   BMI 24.82 kg/m  , BMI Body mass index is 24.82 kg/m. GEN: Well nourished, well developed, in no acute distress  HEENT: normal  Neck: no JVD, carotid bruits, or masses Cardiac: RRR; no murmurs, rubs, or gallops,no edema  Respiratory:  clear to auscultation bilaterally, normal work of breathing GI: soft, nontender, nondistended, + BS MS: no deformity or atrophy  Skin: warm and dry, no rash Neuro:  Strength and sensation are intact Psych: euthymic mood, full affect  EKG:  EKG is ordered today. EKG showed normal sinus rhythm with left anterior fascicular block.   Recent Labs: 08/17/2020: ALT 14; B Natriuretic Peptide 94.9; BUN 18; Creatinine, Ser 1.13; Hemoglobin 11.2; Platelets 350; Potassium 4.8; Sodium 140    Lipid Panel    Component Value Date/Time   CHOL 138 08/17/2020 1203   CHOL 164 06/24/2013 1031   TRIG 148 08/17/2020 1203   HDL 48 08/17/2020 1203   HDL 49 06/24/2013 1031   CHOLHDL 2.9 08/17/2020 1203   VLDL 30 08/17/2020 1203   LDLCALC 60 08/17/2020 1203   LDLCALC 86 06/24/2013 1031      Wt Readings from Last 3 Encounters:  03/21/21  140 lb 2 oz (63.6 kg)  08/30/20 140 lb  6 oz (63.7 kg)  08/10/20 135 lb (61.2 kg)        ASSESSMENT AND PLAN:  1.  Coronary artery disease involving bypass graft with other forms of angina: She is doing extremely well at this time with no anginal symptoms.  Cardiac studies earlier this year were unremarkable.  Continue dual antiplatelet therapy as tolerated.  2. Essential hypertension: Blood pressure is controlled on amlodipine.  3. Hyperlipidemia with intolerance to statins.  I reviewed most recent lipid profile done in January which showed an LDL of 60 and triglyceride of 148.  She is able to tolerate rosuvastatin 5 mg daily which will be continued.  4.  Stage III chronic kidney disease: Stable with most recent GFR 49.   Disposition:   FU with me in 6 months   Signed, Kathlyn Sacramento, MD 03/21/21 [place], [place]

## 2021-04-11 ENCOUNTER — Other Ambulatory Visit: Payer: Self-pay | Admitting: Cardiovascular Disease

## 2021-04-12 ENCOUNTER — Telehealth: Payer: Self-pay | Admitting: Cardiovascular Disease

## 2021-04-12 NOTE — Telephone Encounter (Signed)
Patient dropped off Handicap parking form to be filled out & signed, put in box

## 2021-04-12 NOTE — Telephone Encounter (Signed)
Handicap card application completed and placed on Dr. Tyrell Antonio desk to be signed.

## 2021-04-18 NOTE — Telephone Encounter (Signed)
Handicap card application signed by Dr. Fletcher Anon. When the patient dropped of the application she provided a self addressed stamped envelope for it to be returned to her. Application placed in today's outgoing mail.

## 2021-05-13 DIAGNOSIS — I129 Hypertensive chronic kidney disease with stage 1 through stage 4 chronic kidney disease, or unspecified chronic kidney disease: Secondary | ICD-10-CM | POA: Diagnosis not present

## 2021-05-13 DIAGNOSIS — E1151 Type 2 diabetes mellitus with diabetic peripheral angiopathy without gangrene: Secondary | ICD-10-CM | POA: Diagnosis not present

## 2021-05-13 DIAGNOSIS — N1832 Chronic kidney disease, stage 3b: Secondary | ICD-10-CM | POA: Diagnosis not present

## 2021-05-13 DIAGNOSIS — E1129 Type 2 diabetes mellitus with other diabetic kidney complication: Secondary | ICD-10-CM | POA: Diagnosis not present

## 2021-05-13 DIAGNOSIS — Z794 Long term (current) use of insulin: Secondary | ICD-10-CM | POA: Diagnosis not present

## 2021-05-13 DIAGNOSIS — N390 Urinary tract infection, site not specified: Secondary | ICD-10-CM | POA: Diagnosis not present

## 2021-06-06 ENCOUNTER — Other Ambulatory Visit: Payer: Self-pay | Admitting: Cardiovascular Disease

## 2021-07-24 DIAGNOSIS — H26492 Other secondary cataract, left eye: Secondary | ICD-10-CM | POA: Diagnosis not present

## 2021-08-12 DIAGNOSIS — Z20822 Contact with and (suspected) exposure to covid-19: Secondary | ICD-10-CM | POA: Diagnosis not present

## 2021-09-01 ENCOUNTER — Other Ambulatory Visit: Payer: Self-pay | Admitting: Cardiovascular Disease

## 2021-09-12 ENCOUNTER — Ambulatory Visit (INDEPENDENT_AMBULATORY_CARE_PROVIDER_SITE_OTHER): Payer: Medicare Other | Admitting: Cardiovascular Disease

## 2021-09-12 ENCOUNTER — Encounter: Payer: Self-pay | Admitting: Cardiovascular Disease

## 2021-09-12 ENCOUNTER — Other Ambulatory Visit: Payer: Self-pay

## 2021-09-12 VITALS — BP 120/60 | HR 72 | Ht 63.0 in | Wt 141.4 lb

## 2021-09-12 DIAGNOSIS — I2510 Atherosclerotic heart disease of native coronary artery without angina pectoris: Secondary | ICD-10-CM | POA: Diagnosis not present

## 2021-09-12 DIAGNOSIS — I10 Essential (primary) hypertension: Secondary | ICD-10-CM | POA: Diagnosis not present

## 2021-09-12 DIAGNOSIS — E785 Hyperlipidemia, unspecified: Secondary | ICD-10-CM | POA: Diagnosis not present

## 2021-09-12 NOTE — Patient Instructions (Signed)
Medication Instructions:  STOP taking clopidogrel (Plavix).  *If you need a refill on your cardiac medications before your next appointment, please call your pharmacy*   Lab Work: None ordered  If you have labs (blood work) drawn today and your tests are completely normal, you will receive your results only by: [place] (if you have MyChart) OR A paper copy in the mail If you have any lab test that is abnormal or we need to change your treatment, we will call you to review the results.   Testing/Procedures: None ordered   Follow-Up: At [facility], you and your health needs are our priority.  As part of our continuing mission to provide you with exceptional heart care, we have created designated Provider Care Teams.  These Care Teams include your primary Cardiologist (physician) and Advanced Practice Providers (APPs -  Physician Assistants and Nurse Practitioners) who all work together to provide you with the care you need, when you need it.  We recommend signing up for the patient portal called "MyChart".  Sign up information is provided on this After Visit Summary.  MyChart is used to connect with patients for Virtual Visits (Telemedicine).  Patients are able to view lab/test results, encounter notes, upcoming appointments, etc.  Non-urgent messages can be sent to your provider as well.   To learn more about what you can do with MyChart, go to [email or web address].    Your next appointment:   6 month(s)  The format for your next appointment:   In Person  Provider:   You may see Dr. Fletcher Anon or one of the following Advanced Practice Providers on your designated Care Team:   Murray Hodgkins, NP Christell Faith, PA-C Cadence Kathlen Mody, [place]

## 2021-09-12 NOTE — Progress Notes (Signed)
Cardiology Office Note   Date:  09/12/2021   ID:  Angela Barrett, Angela Barrett 1940-08-17, MRN [id number]  PCP:  Burnard Bunting, MD  Cardiologist:  Kathlyn Sacramento, MD   Chief Complaint  Patient presents with   Other    6 Month f/u c/o occasional chest pain pt used nitro 07/2021. Meds reviewed verbally with pt.       History of Present Illness: Angela Barrett is a 81 y.o. female who presents for a followup regarding coronary artery disease status post CABG and PCI.  She has known history of diabetes and hypertension. She had CABG in June, 2014 after NSTEMI.  She had recurrent angina post CABG. Cardiac cath in 05/2013 showed significant 3 vessel CAD with diffuse diabetic vessels. SVG to RPDA/OM3 was found to be occluded. OM3 got collaterals from RCA. EF was low normal. I performed Successful PCI and DES placement to proximal and mid right coronary artery. Left circumflex occlusion was left to be treated medically. Most recent nuclear stress test in January 2016 was normal.  She had recurrent syncope with no evidence of arrhythmia on outpatient monitoring. She had syncope while she was wearing the monitor and she was noted to be in normal sinus rhythm at that time.  Most recent cardiac catheterization in June 2020 showed significant underlying three-vessel coronary artery disease with patent LIMA to LAD, patent SVG to diagonal with 50% stenosis in the distal anastomosis, chronically occluded SVG to the right PDA and OM3.  The RCA stent was patent.  The only difference from prior catheterization was progression of ostial left circumflex stenosis to 60%.  She was treated medically. Most recent stress testing was in January 2022 with a [employer].  There was no evidence of ischemia with normal ejection fraction.  Echocardiogram showed normal EF with no significant valvular abnormalities.  She has been doing well overall.  She reports occasional sharp chest discomfort lasting few seconds.  No  prolonged tightness or heavy feeling.  No shortness of breath.  She continues to have intermittent dizziness.   Past Medical History:  Diagnosis Date   Anemia    "after OHS in 01/2013" (05/25/2013)   Anginal pain ([place])    Anxiety    Arthritis    "joints" (05/25/2013)   At risk for falls    Coronary artery disease    a. nl myoview ~ 2006;  b. 01/2013 s/p MI and CABG x 4 (VG->D2, VG->RPDA->OM3, LIMA->LAD;  c. 05/2013 Cath/PCI: LM nl, LAD 60-70p/m, D1 70p, LCX 152m, OM2 60p, RCA 50-60p/71m (3.0x33 Xience DES), VG->D2 60, VG->RPDA/OM3 100, LIMA->LAD nl w 70 dLAD, EF 50%.   Depression    Diabetes mellitus, type 2 ([place])    "dx'd 1992" (05/25/2013)   Dyspnea    DOE   Gait disturbance    uses walker some   GERD (gastroesophageal reflux disease)    Glaucoma    bilateral   Headache(784.0)    "monthly" (05/25/2013)   History of hiatal hernia    History of pneumonia 1985   Hyperlipidemia    Hypertension    Hypothyroidism    IBS (irritable bowel syndrome)    Lymphocytosis 03/24/2012   Myocardial infarction ([place])    2014   OA (osteoarthritis)    PONV (postoperative nausea and vomiting)    Vertigo     Past Surgical History:  Procedure Laterality Date   BLADDER SUSPENSION  2005   CARDIAC CATHETERIZATION  01/2013   CARPAL TUNNEL RELEASE Bilateral 1990-1992  CATARACT EXTRACTION W/PHACO Left 08/17/2018   Procedure: CATARACT EXTRACTION PHACO AND INTRAOCULAR LENS PLACEMENT (IOC) LEFT, DIABETIC;  Surgeon: Birder Robson, MD;  Location: [facility];  Service: Ophthalmology;  Laterality: Left;  [place] 01:01 CDE 8.51 Fluid pack lot # [id number]   CATARACT EXTRACTION W/PHACO Right 09/14/2018   Procedure: CATARACT EXTRACTION PHACO AND INTRAOCULAR LENS PLACEMENT (IOC) RIGHT, DIABETIC;  Surgeon: Birder Robson, MD;  Location: [facility];  Service: Ophthalmology;  Laterality: Right;  [place]  01:39 CDE 15.79 Fluid pack lot # [id number]   COLONOSCOPY WITH PROPOFOL N/A 11/20/2015   Procedure: COLONOSCOPY WITH  PROPOFOL;  Surgeon: Lucilla Lame, MD;  Location: [facility];  Service: Endoscopy;  Laterality: N/A;   CORONARY ANGIOPLASTY WITH STENT PLACEMENT  05/25/2013   "1" (05/25/2013)   CORONARY ARTERY BYPASS GRAFT N/A 01/07/2013   Procedure: CORONARY ARTERY BYPASS GRAFTING (CABG);  Surgeon: Melrose Nakayama, MD;  Location: [place];  Service: Open Heart Surgery;  Laterality: N/A;   ESOPHAGOGASTRODUODENOSCOPY (EGD) WITH PROPOFOL N/A 11/20/2015   Procedure: ESOPHAGOGASTRODUODENOSCOPY (EGD) WITH PROPOFOL;  Surgeon: Lucilla Lame, MD;  Location: [facility];  Service: Endoscopy;  Laterality: N/A;   HAMMER TOE SURGERY Right 2009   KNEE ARTHROSCOPY Bilateral 1993-1996   LEFT HEART CATH AND CORS/GRAFTS ANGIOGRAPHY N/A 01/28/2019   Procedure: LEFT HEART CATH AND CORS/GRAFTS ANGIOGRAPHY;  Surgeon: Nelva Bush, MD;  Location: [place] CV [facility];  Service: Cardiovascular;  Laterality: N/A;   LEFT HEART CATHETERIZATION WITH CORONARY ANGIOGRAM N/A 01/05/2013   Procedure: LEFT HEART CATHETERIZATION WITH CORONARY ANGIOGRAM;  Surgeon: Wellington Hampshire, MD;  Location: [place] CATH LAB;  Service: Cardiovascular;  Laterality: N/A;   LEFT HEART CATHETERIZATION WITH CORONARY/GRAFT ANGIOGRAM N/A 05/25/2013   Procedure: LEFT HEART CATHETERIZATION WITH Beatrix Fetters;  Surgeon: Wellington Hampshire, MD;  Location: [place] CATH LAB;  Service: Cardiovascular;  Laterality: N/A;   PERCUTANEOUS CORONARY STENT INTERVENTION (PCI-S)  05/25/2013   Procedure: PERCUTANEOUS CORONARY STENT INTERVENTION (PCI-S);  Surgeon: Wellington Hampshire, MD;  Location: [facility] C[facility];  Service: Cardiovascular;;   THYROIDECTOMY  2010   VAGINAL HYSTERECTOMY  1974     Current Outpatient Medications  Medication Sig Dispense Refill   acetaminophen (TYLENOL) 650 MG CR tablet Take 650 mg by mouth at bedtime.     acyclovir (ZOVIRAX) 400 MG tablet Take 400 mg by mouth 2 (two) times daily.     amLODipine (NORVASC) 5 MG tablet TAKE 1 TABLET BY MOUTH EVERY DAY 90  tablet 1   aspirin 81 MG tablet Take 1 tablet (81 mg total) by mouth daily.     B-D INS SYRINGE 0.5CC/31GX5/16 31G X 5/16" 0.5 ML MISC daily. as directed  5   B-D ULTRAFINE III SHORT PEN 31G X 8 MM MISC daily. as directed  5   Carboxymethylcellulose Sodium 0.25 % SOLN Place 1 drop into both eyes 3 (three) times daily as needed (for dry/irritated eyes.).     Ganciclovir (ZIRGAN) 0.15 % GEL APPLY 1 A SMALL AMOUNT IN RIGHT EYE 5 TIMES A DAY UNTIL DIRECTED     HUMALOG KWIKPEN 100 UNIT/ML KwikPen Inject 10 Units into the skin daily.     insulin aspart (NOVOLOG) 100 UNIT/ML injection Inject into the skin.     latanoprost (XALATAN) 0.005 % ophthalmic solution Place 1 drop into both eyes at bedtime.      LEVEMIR 100 UNIT/ML injection Inject 20 Units into the skin at bedtime. Sliding scale  3   levothyroxine (SYNTHROID, LEVOTHROID) 88 MCG tablet Take 88 mcg by  mouth daily before breakfast.     LORazepam (ATIVAN) 1 MG tablet Take 2.5 mg by mouth at bedtime.      meclizine (ANTIVERT) 25 MG tablet Take 25 mg by mouth daily as needed.     metFORMIN (GLUCOPHAGE) 500 MG tablet Take 1,000 mg by mouth 2 (two) times daily.     nitroGLYCERIN (NITROSTAT) 0.4 MG SL tablet PLACE 1 TABLET (0.4 MG TOTAL) UNDER THE TONGUE EVERY 5 (FIVE) MINUTES AS NEEDED FOR CHEST PAIN. 25 tablet 3   ondansetron (ZOFRAN ODT) 4 MG disintegrating tablet Take 1 tablet (4 mg total) by mouth every 8 (eight) hours as needed for nausea or vomiting. 15 tablet 0   ONE TOUCH ULTRA TEST test strip USE AS DIRECTED TO TEST BLOOD SUGAR 3 TIMES A DAY DX E11.51  3   OXERVATE 0.002 % SOLN      pantoprazole (PROTONIX) 40 MG tablet Take 40 mg by mouth daily before breakfast.     rosuvastatin (CRESTOR) 5 MG tablet TAKE 1 TABLET BY MOUTH EVERY DAY WITH BREAKFAST 90 tablet 0   sertraline (ZOLOFT) 25 MG tablet Take 25 mg by mouth daily.     sertraline (ZOLOFT) 50 MG tablet Take by mouth.     No current facility-administered medications for this visit.     Allergies:   Amitriptyline, Atorvastatin, Cortisone, Hydrocortisone, Macrodantin [nitrofurantoin], and Motrin [ibuprofen]    Social History:  The patient  reports that she has never smoked. She has never used smokeless tobacco. She reports that she does not drink alcohol and does not use drugs.   Family History:  The patient's family history includes Cancer in her mother; Diabetes in her son; Emphysema in her brother and father.    ROS:  Please see the history of present illness.   Otherwise, review of systems are positive for none.   All other systems are reviewed and negative.    PHYSICAL EXAM: VS:  BP 120/60 (BP Location: Left Arm, Patient Position: Sitting, Cuff Size: Normal)    Pulse 72    Ht 5\' 3"  (1.6 m)    Wt 141 lb 6 oz (64.1 kg)    SpO2 98%    BMI 25.04 kg/m  , BMI Body mass index is 25.04 kg/m. GEN: Well nourished, well developed, in no acute distress  HEENT: normal  Neck: no JVD, carotid bruits, or masses Cardiac: RRR; no murmurs, rubs, or gallops,no edema  Respiratory:  clear to auscultation bilaterally, normal work of breathing GI: soft, nontender, nondistended, + BS MS: no deformity or atrophy  Skin: warm and dry, no rash Neuro:  Strength and sensation are intact Psych: euthymic mood, full affect  EKG:  EKG is ordered today. EKG showed normal sinus rhythm with left anterior fascicular block.   Recent Labs: No results found for requested labs within last 8760 hours.    Lipid Panel    Component Value Date/Time   CHOL 138 08/17/2020 1203   CHOL 164 06/24/2013 1031   TRIG 148 08/17/2020 1203   HDL 48 08/17/2020 1203   HDL 49 06/24/2013 1031   CHOLHDL 2.9 08/17/2020 1203   VLDL 30 08/17/2020 1203   LDLCALC 60 08/17/2020 1203   LDLCALC 86 06/24/2013 1031      Wt Readings from Last 3 Encounters:  09/12/21 141 lb 6 oz (64.1 kg)  03/21/21 140 lb 2 oz (63.6 kg)  08/30/20 140 lb 6 oz (63.7 kg)        ASSESSMENT AND PLAN:  1.  Coronary artery  disease involving bypass graft with other forms of angina: She is doing extremely well at this time with no anginal symptoms.  Brief sharp chest discomfort seems to be musculoskeletal.  She was reassured.  I elected to discontinue clopidogrel today and continue aspirin 81 mg daily.  2. Essential hypertension: Blood pressure is controlled on amlodipine.  3. Hyperlipidemia with intolerance to statins.  I reviewed most recent lipid profile done in January which showed an LDL of 60 and triglyceride of 148.  She is able to tolerate rosuvastatin 5 mg daily which will be continued.  4.  Stage III chronic kidney disease: Stable with most recent GFR 49.   Disposition:   FU with me in 6 months   Signed, Kathlyn Sacramento, MD 09/12/21 [place], [place]

## 2021-09-16 DIAGNOSIS — I129 Hypertensive chronic kidney disease with stage 1 through stage 4 chronic kidney disease, or unspecified chronic kidney disease: Secondary | ICD-10-CM | POA: Diagnosis not present

## 2021-09-16 DIAGNOSIS — E1129 Type 2 diabetes mellitus with other diabetic kidney complication: Secondary | ICD-10-CM | POA: Diagnosis not present

## 2021-09-16 DIAGNOSIS — N1832 Chronic kidney disease, stage 3b: Secondary | ICD-10-CM | POA: Diagnosis not present

## 2021-09-16 DIAGNOSIS — I700 Atherosclerosis of aorta: Secondary | ICD-10-CM | POA: Diagnosis not present

## 2021-10-08 ENCOUNTER — Other Ambulatory Visit: Payer: Self-pay | Admitting: Cardiovascular Disease

## 2021-10-25 DIAGNOSIS — Z20822 Contact with and (suspected) exposure to covid-19: Secondary | ICD-10-CM | POA: Diagnosis not present

## 2021-11-14 DIAGNOSIS — Z20822 Contact with and (suspected) exposure to covid-19: Secondary | ICD-10-CM | POA: Diagnosis not present

## 2021-11-18 DIAGNOSIS — Z20822 Contact with and (suspected) exposure to covid-19: Secondary | ICD-10-CM | POA: Diagnosis not present

## 2021-11-19 DIAGNOSIS — Z20822 Contact with and (suspected) exposure to covid-19: Secondary | ICD-10-CM | POA: Diagnosis not present

## 2021-12-06 ENCOUNTER — Other Ambulatory Visit: Payer: Self-pay | Admitting: Cardiovascular Disease

## 2021-12-07 DIAGNOSIS — Z20822 Contact with and (suspected) exposure to covid-19: Secondary | ICD-10-CM | POA: Diagnosis not present

## 2021-12-09 DIAGNOSIS — Z20822 Contact with and (suspected) exposure to covid-19: Secondary | ICD-10-CM | POA: Diagnosis not present

## 2021-12-16 DIAGNOSIS — N3000 Acute cystitis without hematuria: Secondary | ICD-10-CM | POA: Diagnosis not present

## 2021-12-18 DIAGNOSIS — N1832 Chronic kidney disease, stage 3b: Secondary | ICD-10-CM | POA: Diagnosis not present

## 2021-12-18 DIAGNOSIS — E1129 Type 2 diabetes mellitus with other diabetic kidney complication: Secondary | ICD-10-CM | POA: Diagnosis not present

## 2021-12-18 DIAGNOSIS — I700 Atherosclerosis of aorta: Secondary | ICD-10-CM | POA: Diagnosis not present

## 2021-12-18 DIAGNOSIS — E1151 Type 2 diabetes mellitus with diabetic peripheral angiopathy without gangrene: Secondary | ICD-10-CM | POA: Diagnosis not present

## 2021-12-18 DIAGNOSIS — N390 Urinary tract infection, site not specified: Secondary | ICD-10-CM | POA: Diagnosis not present

## 2021-12-18 DIAGNOSIS — I129 Hypertensive chronic kidney disease with stage 1 through stage 4 chronic kidney disease, or unspecified chronic kidney disease: Secondary | ICD-10-CM | POA: Diagnosis not present

## 2021-12-18 DIAGNOSIS — R197 Diarrhea, unspecified: Secondary | ICD-10-CM | POA: Diagnosis not present

## 2021-12-18 DIAGNOSIS — E785 Hyperlipidemia, unspecified: Secondary | ICD-10-CM | POA: Diagnosis not present

## 2021-12-18 DIAGNOSIS — Z794 Long term (current) use of insulin: Secondary | ICD-10-CM | POA: Diagnosis not present

## 2021-12-18 DIAGNOSIS — K219 Gastro-esophageal reflux disease without esophagitis: Secondary | ICD-10-CM | POA: Diagnosis not present

## 2022-01-03 DIAGNOSIS — N139 Obstructive and reflux uropathy, unspecified: Secondary | ICD-10-CM | POA: Diagnosis not present

## 2022-01-03 DIAGNOSIS — N3000 Acute cystitis without hematuria: Secondary | ICD-10-CM | POA: Diagnosis not present

## 2022-01-21 DIAGNOSIS — R109 Unspecified abdominal pain: Secondary | ICD-10-CM | POA: Diagnosis not present

## 2022-01-21 DIAGNOSIS — S29019A Strain of muscle and tendon of unspecified wall of thorax, initial encounter: Secondary | ICD-10-CM | POA: Diagnosis not present

## 2022-02-24 DIAGNOSIS — H168 Other keratitis: Secondary | ICD-10-CM | POA: Diagnosis not present

## 2022-02-24 DIAGNOSIS — H26491 Other secondary cataract, right eye: Secondary | ICD-10-CM | POA: Diagnosis not present

## 2022-02-24 DIAGNOSIS — H401132 Primary open-angle glaucoma, bilateral, moderate stage: Secondary | ICD-10-CM | POA: Diagnosis not present

## 2022-03-02 ENCOUNTER — Other Ambulatory Visit: Payer: Self-pay | Admitting: Cardiovascular Disease

## 2022-04-05 ENCOUNTER — Other Ambulatory Visit: Payer: Self-pay | Admitting: Cardiovascular Disease

## 2022-04-12 DIAGNOSIS — S239XXA Sprain of unspecified parts of thorax, initial encounter: Secondary | ICD-10-CM | POA: Diagnosis not present

## 2022-04-23 DIAGNOSIS — M546 Pain in thoracic spine: Secondary | ICD-10-CM | POA: Diagnosis not present

## 2022-04-23 DIAGNOSIS — R0789 Other chest pain: Secondary | ICD-10-CM | POA: Diagnosis not present

## 2022-04-23 DIAGNOSIS — E1151 Type 2 diabetes mellitus with diabetic peripheral angiopathy without gangrene: Secondary | ICD-10-CM | POA: Diagnosis not present

## 2022-05-02 ENCOUNTER — Ambulatory Visit
Admission: RE | Admit: 2022-05-02 | Discharge: 2022-05-02 | Disposition: A | Discharge status: Home / Self Care | Payer: Medicare Other | Source: Ambulatory Visit | Attending: Internal Medicine | Admitting: Internal Medicine

## 2022-05-02 ENCOUNTER — Other Ambulatory Visit: Payer: Self-pay | Admitting: Internal Medicine

## 2022-05-02 DIAGNOSIS — R0781 Pleurodynia: Secondary | ICD-10-CM

## 2022-05-02 DIAGNOSIS — J984 Other disorders of lung: Secondary | ICD-10-CM | POA: Diagnosis not present

## 2022-05-02 DIAGNOSIS — R0602 Shortness of breath: Secondary | ICD-10-CM

## 2022-05-02 DIAGNOSIS — K760 Fatty (change of) liver, not elsewhere classified: Secondary | ICD-10-CM | POA: Diagnosis not present

## 2022-05-02 DIAGNOSIS — M549 Dorsalgia, unspecified: Secondary | ICD-10-CM | POA: Diagnosis not present

## 2022-05-02 DIAGNOSIS — I700 Atherosclerosis of aorta: Secondary | ICD-10-CM | POA: Diagnosis not present

## 2022-05-02 MED ORDER — IOPAMIDOL (ISOVUE-370) INJECTION 76%
60.0000 mL | Freq: Once | INTRAVENOUS | Status: AC | PRN
  Administered 2022-05-02: 60 mL via INTRAVENOUS

## 2022-05-08 NOTE — Progress Notes (Signed)
PCP notes requested.  

## 2022-05-13 ENCOUNTER — Ambulatory Visit: Payer: Medicare Other | Attending: Cardiovascular Disease | Admitting: Cardiovascular Disease

## 2022-05-13 ENCOUNTER — Encounter: Payer: Self-pay | Admitting: Cardiovascular Disease

## 2022-05-13 VITALS — BP 158/70 | HR 73 | Ht 63.0 in | Wt 137.1 lb

## 2022-05-13 DIAGNOSIS — E785 Hyperlipidemia, unspecified: Secondary | ICD-10-CM | POA: Diagnosis not present

## 2022-05-13 DIAGNOSIS — I25118 Atherosclerotic heart disease of native coronary artery with other forms of angina pectoris: Secondary | ICD-10-CM | POA: Insufficient documentation

## 2022-05-13 DIAGNOSIS — I2510 Atherosclerotic heart disease of native coronary artery without angina pectoris: Secondary | ICD-10-CM | POA: Diagnosis not present

## 2022-05-13 DIAGNOSIS — I10 Essential (primary) hypertension: Secondary | ICD-10-CM | POA: Insufficient documentation

## 2022-05-13 NOTE — Progress Notes (Signed)
Cardiology Office Note   Date:  05/13/2022   ID:  Angela, Barrett Mar 26, 1941, MRN [id number]  PCP:  Burnard Bunting, MD  Cardiologist:  Kathlyn Sacramento, MD   Chief Complaint  Patient presents with   Other    6 month f/u no complaints today. Meds reviewed verbally with pt.       History of Present Illness: Angela Barrett is a 81 y.o. female who presents for a followup regarding coronary artery disease status post CABG and PCI.  She has known history of diabetes and hypertension. She had CABG in June, 2014 after NSTEMI.  She had recurrent angina post CABG. Cardiac cath in 05/2013 showed significant 3 vessel CAD with diffuse diabetic vessels. SVG to RPDA/OM3 was found to be occluded. OM3 got collaterals from RCA. EF was low normal. I performed Successful PCI and DES placement to proximal and mid right coronary artery. Left circumflex occlusion was left to be treated medically. Most recent nuclear stress test in January 2016 was normal.  She had recurrent syncope with no evidence of arrhythmia on outpatient monitoring. She had syncope while she was wearing the monitor and she was noted to be in normal sinus rhythm at that time.  Most recent cardiac catheterization in June 2020 showed significant underlying three-vessel coronary artery disease with patent LIMA to LAD, patent SVG to diagonal with 50% stenosis in the distal anastomosis, chronically occluded SVG to the right PDA and OM3.  The RCA stent was patent.  The only difference from prior catheterization was progression of ostial left circumflex stenosis to 60%.  She was treated medically. Most recent stress testing was in January 2022 with a [employer].  There was no evidence of ischemia with normal ejection fraction.  Echocardiogram showed normal EF with no significant valvular abnormalities.  She has been doing well with no recent chest pain or worsening dyspnea.  No palpitations.  She had recent issues with left-sided back  pain that seems to be improving.   Past Medical History:  Diagnosis Date   Anemia    "after OHS in 01/2013" (05/25/2013)   Anginal pain ([place])    Anxiety    Arthritis    "joints" (05/25/2013)   At risk for falls    Coronary artery disease    a. nl myoview ~ 2006;  b. 01/2013 s/p MI and CABG x 4 (VG->D2, VG->RPDA->OM3, LIMA->LAD;  c. 05/2013 Cath/PCI: LM nl, LAD 60-70p/m, D1 70p, LCX 157m OM2 60p, RCA 50-60p/8101m3.0x33 Xience DES), VG->D2 60, VG->RPDA/OM3 100, LIMA->LAD nl w 70 dLAD, EF 50%.   Depression    Diabetes [place], type 2 [place]   "dx'd 1992" (05/25/2013)   Dyspnea    DOE   Gait disturbance    uses walker some   GERD (gastroesophageal reflux disease)    Glaucoma    bilateral   Headache(784.0)    "monthly" (05/25/2013)   History of hiatal hernia    History of pneumonia 1985   Hyperlipidemia    Hypertension    Hypothyroidism    IBS (irritable bowel syndrome)    Lymphocytosis 03/24/2012   Myocardial infarction ([place]   2014   OA (osteoarthritis)    PONV (postoperative nausea and vomiting)    Vertigo     Past Surgical History:  Procedure Laterality Date   BLADDER SUSPENSION  2005   CARDIAC CATHETERIZATION  01/2013   CARPAL TUNNEL RELEASE Bilateral 1990-1992   CATARACT EXTRACTION W/PHACO Left 08/17/2018   Procedure:  CATARACT EXTRACTION PHACO AND INTRAOCULAR LENS PLACEMENT (IOC) LEFT, DIABETIC;  Surgeon: Birder Robson, MD;  Location: [facility];  Service: Ophthalmology;  Laterality: Left;  [place] 01:01 CDE 8.51 Fluid pack lot # [id number]   CATARACT EXTRACTION W/PHACO Right 09/14/2018   Procedure: CATARACT EXTRACTION PHACO AND INTRAOCULAR LENS PLACEMENT (IOC) RIGHT, DIABETIC;  Surgeon: Birder Robson, MD;  Location: [facility];  Service: Ophthalmology;  Laterality: Right;  [place]  01:39 CDE 15.79 Fluid pack lot # [id number]   COLONOSCOPY WITH PROPOFOL N/A 11/20/2015   Procedure: COLONOSCOPY WITH PROPOFOL;  Surgeon: Lucilla Lame, MD;  Location: [facility];  Service:  Endoscopy;  Laterality: N/A;   CORONARY ANGIOPLASTY WITH STENT PLACEMENT  05/25/2013   "1" (05/25/2013)   CORONARY ARTERY BYPASS GRAFT N/A 01/07/2013   Procedure: CORONARY ARTERY BYPASS GRAFTING (CABG);  Surgeon: Melrose Nakayama, MD;  Location: [place];  Service: Open Heart Surgery;  Laterality: N/A;   ESOPHAGOGASTRODUODENOSCOPY (EGD) WITH PROPOFOL N/A 11/20/2015   Procedure: ESOPHAGOGASTRODUODENOSCOPY (EGD) WITH PROPOFOL;  Surgeon: Lucilla Lame, MD;  Location: [facility];  Service: Endoscopy;  Laterality: N/A;   HAMMER TOE SURGERY Right 2009   KNEE ARTHROSCOPY Bilateral 1993-1996   LEFT HEART CATH AND CORS/GRAFTS ANGIOGRAPHY N/A 01/28/2019   Procedure: LEFT HEART CATH AND CORS/GRAFTS ANGIOGRAPHY;  Surgeon: Nelva Bush, MD;  Location: [place] CV LAB;  Service: Cardiovascular;  Laterality: N/A;   LEFT HEART CATHETERIZATION WITH CORONARY ANGIOGRAM N/A 01/05/2013   Procedure: LEFT HEART CATHETERIZATION WITH CORONARY ANGIOGRAM;  Surgeon: Wellington Hampshire, MD;  Location: [place] CATH LAB;  Service: Cardiovascular;  Laterality: N/A;   LEFT HEART CATHETERIZATION WITH CORONARY/GRAFT ANGIOGRAM N/A 05/25/2013   Procedure: LEFT HEART CATHETERIZATION WITH Beatrix Fetters;  Surgeon: Wellington Hampshire, MD;  Location: [place] CATH LAB;  Service: Cardiovascular;  Laterality: N/A;   PERCUTANEOUS CORONARY STENT INTERVENTION (PCI-S)  05/25/2013   Procedure: PERCUTANEOUS CORONARY STENT INTERVENTION (PCI-S);  Surgeon: Wellington Hampshire, MD;  Location: [facility];  Service: Cardiovascular;;   THYROIDECTOMY  2010   VAGINAL HYSTERECTOMY  1974     Current Outpatient Medications  Medication Sig Dispense Refill   acetaminophen (TYLENOL) 650 MG CR tablet Take 650 mg by mouth at bedtime.     acyclovir (ZOVIRAX) 400 MG tablet Take 400 mg by mouth 2 (two) times daily.     amLODipine (NORVASC) 5 MG tablet TAKE 1 TABLET BY MOUTH EVERY DAY 90 tablet 1   aspirin 81 MG tablet Take 1 tablet (81 mg total) by mouth  daily.     B-D INS SYRINGE 0.5CC/31GX5/16 31G X 5/16" 0.5 ML MISC daily. as directed  5   B-D ULTRAFINE III SHORT PEN 31G X 8 MM MISC daily. as directed  5   Carboxymethylcellulose Sodium 0.25 % SOLN Place 1 drop into both eyes 3 (three) times daily as needed (for dry/irritated eyes.).     Ganciclovir (ZIRGAN) 0.15 % GEL APPLY 1 A SMALL AMOUNT IN RIGHT EYE 5 TIMES A DAY UNTIL DIRECTED     HUMALOG KWIKPEN 100 UNIT/ML KwikPen Inject 10 Units into the skin daily.     insulin aspart (NOVOLOG) 100 UNIT/ML injection Inject into the skin.     latanoprost (XALATAN) 0.005 % ophthalmic solution Place 1 drop into both eyes at bedtime.      LEVEMIR 100 UNIT/ML injection Inject 20 Units into the skin at bedtime. Sliding scale  3   levothyroxine (SYNTHROID, LEVOTHROID) 88 MCG tablet Take 88 mcg by mouth daily before breakfast.  LORazepam (ATIVAN) 1 MG tablet Take 2.5 mg by mouth at bedtime.      meclizine (ANTIVERT) 25 MG tablet Take 25 mg by mouth daily as needed.     metFORMIN (GLUCOPHAGE) 500 MG tablet Take 500 mg by mouth 2 (two) times daily.     nitroGLYCERIN (NITROSTAT) 0.4 MG SL tablet PLACE 1 TABLET (0.4 MG TOTAL) UNDER THE TONGUE EVERY 5 (FIVE) MINUTES AS NEEDED FOR CHEST PAIN. 25 tablet 3   ondansetron (ZOFRAN ODT) 4 MG disintegrating tablet Take 1 tablet (4 mg total) by mouth every 8 (eight) hours as needed for nausea or vomiting. 15 tablet 0   ONE TOUCH ULTRA TEST test strip USE AS DIRECTED TO TEST BLOOD SUGAR 3 TIMES A DAY DX E11.51  3   OXERVATE 0.002 % SOLN      pantoprazole (PROTONIX) 40 MG tablet Take 40 mg by mouth daily before breakfast.     rosuvastatin (CRESTOR) 5 MG tablet TAKE 1 TABLET BY MOUTH EVERY DAY WITH BREAKFAST 90 tablet 0   sertraline (ZOLOFT) 25 MG tablet Take 25 mg by mouth daily.     sertraline (ZOLOFT) 50 MG tablet Take by mouth.     No current facility-administered medications for this visit.    Allergies:   Amitriptyline, Atorvastatin, Cortisone, Hydrocortisone,  Macrodantin [nitrofurantoin], and Motrin [ibuprofen]    Social History:  The patient  reports that she has never smoked. She has never used smokeless tobacco. She reports that she does not drink alcohol and does not use drugs.   Family History:  The patient's family history includes Cancer in her mother; Diabetes in her son; Emphysema in her brother and father.    ROS:  Please see the history of present illness.   Otherwise, review of systems are positive for none.   All other systems are reviewed and negative.    PHYSICAL EXAM: VS:  BP (!) 158/70 (BP Location: Left Arm, Patient Position: Sitting, Cuff Size: Normal)   Pulse 73   Ht '5\' 3"'$  (1.6 m)   Wt 137 lb 2 oz (62.2 kg)   SpO2 98%   BMI 24.29 kg/m  , BMI Body mass index is 24.29 kg/m. GEN: Well nourished, well developed, in no acute distress  HEENT: normal  Neck: no JVD, carotid bruits, or masses Cardiac: RRR; no rubs, or gallops,no edema .  1 out of 6 systolic murmur in the aortic area. Respiratory:  clear to auscultation bilaterally, normal work of breathing GI: soft, nontender, nondistended, + BS MS: no deformity or atrophy  Skin: warm and dry, no rash Neuro:  Strength and sensation are intact Psych: euthymic mood, full affect  EKG:  EKG is ordered today. EKG showed normal sinus rhythm with left anterior fascicular block.   Recent Labs: No results found for requested labs within last 365 days.    Lipid Panel    Component Value Date/Time   CHOL 138 08/17/2020 1203   CHOL 164 06/24/2013 1031   TRIG 148 08/17/2020 1203   HDL 48 08/17/2020 1203   HDL 49 06/24/2013 1031   CHOLHDL 2.9 08/17/2020 1203   VLDL 30 08/17/2020 1203   LDLCALC 60 08/17/2020 1203   LDLCALC 86 06/24/2013 1031      Wt Readings from Last 3 Encounters:  05/13/22 137 lb 2 oz (62.2 kg)  09/12/21 141 lb 6 oz (64.1 kg)  03/21/21 140 lb 2 oz (63.6 kg)        ASSESSMENT AND PLAN:  1.  Coronary  artery disease involving bypass graft with  other forms of angina: She is doing extremely well at this time with no anginal symptoms.  Brief sharp chest discomfort seems to be musculoskeletal.  .  continue aspirin 81 mg daily.  2. Essential hypertension: Blood pressure is reasonably controlled on amlodipine.  3. Hyperlipidemia with intolerance to statins.   She is able to tolerate rosuvastatin 5 mg daily which will be continued. I requested lipid and liver profile.   4.  Stage III chronic kidney disease: I requested BMP.    Disposition:   FU with me in 6 months   Signed, Kathlyn Sacramento, MD 05/13/22 [place], [place]

## 2022-05-13 NOTE — Patient Instructions (Signed)
Medication Instructions:  Your physician recommends that you continue on your current medications as directed. Please refer to the Current Medication list given to you today.  *If you need a refill on your cardiac medications before your next appointment, please call your pharmacy*   Lab Work: Your physician recommends that you have the following labs drawn today: BMET and LIPID Profile.  If you have labs (blood work) drawn today and your tests are completely normal, you will receive your results only by: [place] (if you have MyChart) OR A paper copy in the mail If you have any lab test that is abnormal or we need to change your treatment, we will call you to review the results.   Testing/Procedures: NONE ordered at this time of appointment   Follow-Up: At [facility], you and your health needs are our priority.  As part of our continuing mission to provide you with exceptional heart care, we have created designated Provider Care Teams.  These Care Teams include your primary Cardiologist (physician) and Advanced Practice Providers (APPs -  Physician Assistants and Nurse Practitioners) who all work together to provide you with the care you need, when you need it.  We recommend signing up for the patient portal called "MyChart".  Sign up information is provided on this After Visit Summary.  MyChart is used to connect with patients for Virtual Visits (Telemedicine).  Patients are able to view lab/test results, encounter notes, upcoming appointments, etc.  Non-urgent messages can be sent to your provider as well.   To learn more about what you can do with MyChart, go to [email or web address].    Your next appointment:   6 month(s)  The format for your next appointment:   In Person  Provider:   Kathlyn Sacramento, MD    Important Information About Sugar

## 2022-05-14 ENCOUNTER — Telehealth: Payer: Self-pay | Admitting: Cardiovascular Disease

## 2022-05-14 ENCOUNTER — Other Ambulatory Visit: Payer: Self-pay

## 2022-05-14 DIAGNOSIS — E785 Hyperlipidemia, unspecified: Secondary | ICD-10-CM

## 2022-05-14 DIAGNOSIS — I10 Essential (primary) hypertension: Secondary | ICD-10-CM

## 2022-05-14 NOTE — Telephone Encounter (Signed)
[place] [facility] calling stating pt needs lab order put in  "Lab Work: Your physician recommends that you have the following labs drawn today: BMET and LIPID Profile.    Reached out to [place], CMA through secure chat, she states she will re put them in.

## 2022-05-14 NOTE — Progress Notes (Signed)
Bmp and Lipid put in for [place] and instructed the patient to come back fasting tomorrow. She stated that she was not instructed to be fasting.

## 2022-05-14 NOTE — Telephone Encounter (Signed)
Orders were entered and patient was instructed to return tomorrow so she could be fasting for lipid panel.

## 2022-05-15 ENCOUNTER — Other Ambulatory Visit
Admission: RE | Admit: 2022-05-15 | Discharge: 2022-05-15 | Disposition: A | Discharge status: Home / Self Care | Payer: Medicare Other | Attending: Cardiovascular Disease | Admitting: Cardiovascular Disease

## 2022-05-15 DIAGNOSIS — E785 Hyperlipidemia, unspecified: Secondary | ICD-10-CM | POA: Diagnosis not present

## 2022-05-15 DIAGNOSIS — I10 Essential (primary) hypertension: Secondary | ICD-10-CM | POA: Diagnosis not present

## 2022-05-15 LAB — BASIC METABOLIC PANEL
Anion gap: 9 (ref 5–15)
BUN: 27 mg/dL — ABNORMAL HIGH (ref 8–23)
CO2: 24 mmol/L (ref 22–32)
Calcium: 9.4 mg/dL (ref 8.9–10.3)
Chloride: 106 mmol/L (ref 98–111)
Creatinine, Ser: 1.36 mg/dL — ABNORMAL HIGH (ref 0.44–1.00)
GFR, Estimated: 39 mL/min — ABNORMAL LOW (ref >60)
Glucose, Bld: 176 mg/dL — ABNORMAL HIGH (ref 70–99)
Potassium: 4.9 mmol/L (ref 3.5–5.1)
Sodium: 139 mmol/L (ref 135–145)

## 2022-05-15 LAB — LIPID PANEL
Cholesterol: 140 mg/dL (ref 0–200)
HDL: 43 mg/dL (ref >40)
LDL Cholesterol: 66 mg/dL (ref 0–99)
Total CHOL/HDL Ratio: 3.3 RATIO
Triglycerides: 154 mg/dL — ABNORMAL HIGH (ref <150)
VLDL: 31 mg/dL (ref 0–40)

## 2022-05-19 ENCOUNTER — Encounter: Payer: Self-pay | Admitting: *Deleted

## 2022-05-20 ENCOUNTER — Telehealth: Payer: Self-pay | Admitting: Cardiovascular Disease

## 2022-05-20 NOTE — Telephone Encounter (Signed)
Patient called to get results of lab test.

## 2022-05-21 NOTE — Telephone Encounter (Signed)
Called patient and informed her of her Lipid and BMP lab result. Patient was grateful for the follow up.

## 2022-05-28 ENCOUNTER — Other Ambulatory Visit: Payer: Self-pay | Admitting: Cardiovascular Disease

## 2022-06-18 DIAGNOSIS — H401132 Primary open-angle glaucoma, bilateral, moderate stage: Secondary | ICD-10-CM | POA: Diagnosis not present

## 2022-06-18 DIAGNOSIS — H168 Other keratitis: Secondary | ICD-10-CM | POA: Diagnosis not present

## 2022-07-14 ENCOUNTER — Emergency Department
Admission: EM | Admit: 2022-07-14 | Discharge: 2022-07-14 | Disposition: A | Discharge status: Home / Self Care | Payer: Medicare Other | Attending: Emergency Medicine | Admitting: Emergency Medicine

## 2022-07-14 ENCOUNTER — Other Ambulatory Visit: Payer: Self-pay

## 2022-07-14 ENCOUNTER — Emergency Department: Payer: Medicare Other

## 2022-07-14 DIAGNOSIS — I10 Essential (primary) hypertension: Secondary | ICD-10-CM | POA: Diagnosis not present

## 2022-07-14 DIAGNOSIS — R55 Syncope and collapse: Secondary | ICD-10-CM | POA: Insufficient documentation

## 2022-07-14 DIAGNOSIS — S199XXA Unspecified injury of neck, initial encounter: Secondary | ICD-10-CM | POA: Diagnosis not present

## 2022-07-14 DIAGNOSIS — E119 Type 2 diabetes mellitus without complications: Secondary | ICD-10-CM | POA: Diagnosis not present

## 2022-07-14 DIAGNOSIS — R058 Other specified cough: Secondary | ICD-10-CM | POA: Insufficient documentation

## 2022-07-14 DIAGNOSIS — Z1152 Encounter for screening for COVID-19: Secondary | ICD-10-CM | POA: Insufficient documentation

## 2022-07-14 DIAGNOSIS — W19XXXA Unspecified fall, initial encounter: Secondary | ICD-10-CM | POA: Diagnosis not present

## 2022-07-14 DIAGNOSIS — M4312 Spondylolisthesis, cervical region: Secondary | ICD-10-CM | POA: Diagnosis not present

## 2022-07-14 DIAGNOSIS — R42 Dizziness and giddiness: Secondary | ICD-10-CM | POA: Diagnosis present

## 2022-07-14 DIAGNOSIS — S0990XA Unspecified injury of head, initial encounter: Secondary | ICD-10-CM | POA: Diagnosis not present

## 2022-07-14 LAB — CBC WITH DIFFERENTIAL/PLATELET
Abs Immature Granulocytes: 0.02 10*3/uL (ref 0.00–0.07)
Basophils Absolute: 0 10*3/uL (ref 0.0–0.1)
Basophils Relative: 0 %
Eosinophils Absolute: 0.2 10*3/uL (ref 0.0–0.5)
Eosinophils Relative: 2 %
HCT: 37.2 % (ref 36.0–46.0)
Hemoglobin: 12 g/dL (ref 12.0–15.0)
Immature Granulocytes: 0 %
Lymphocytes Relative: 31 %
Lymphs Abs: 2.4 10*3/uL (ref 0.7–4.0)
MCH: 29.5 pg (ref 26.0–34.0)
MCHC: 32.3 g/dL (ref 30.0–36.0)
MCV: 91.4 fL (ref 80.0–100.0)
Monocytes Absolute: 0.6 10*3/uL (ref 0.1–1.0)
Monocytes Relative: 8 %
Neutro Abs: 4.4 10*3/uL (ref 1.7–7.7)
Neutrophils Relative %: 59 %
Platelets: 350 10*3/uL (ref 150–400)
RBC: 4.07 MIL/uL (ref 3.87–5.11)
RDW: 14.4 % (ref 11.5–15.5)
WBC: 7.6 10*3/uL (ref 4.0–10.5)
nRBC: 0 % (ref 0.0–0.2)

## 2022-07-14 LAB — COMPREHENSIVE METABOLIC PANEL
ALT: 19 U/L (ref 0–44)
AST: 26 U/L (ref 15–41)
Albumin: 4.2 g/dL (ref 3.5–5.0)
Alkaline Phosphatase: 52 U/L (ref 38–126)
Anion gap: 11 (ref 5–15)
BUN: 18 mg/dL (ref 8–23)
CO2: 21 mmol/L — ABNORMAL LOW (ref 22–32)
Calcium: 9.2 mg/dL (ref 8.9–10.3)
Chloride: 106 mmol/L (ref 98–111)
Creatinine, Ser: 1.29 mg/dL — ABNORMAL HIGH (ref 0.44–1.00)
GFR, Estimated: 42 mL/min — ABNORMAL LOW (ref >60)
Glucose, Bld: 170 mg/dL — ABNORMAL HIGH (ref 70–99)
Potassium: 4.7 mmol/L (ref 3.5–5.1)
Sodium: 138 mmol/L (ref 135–145)
Total Bilirubin: 0.6 mg/dL (ref 0.3–1.2)
Total Protein: 7.6 g/dL (ref 6.5–8.1)

## 2022-07-14 LAB — RESP PANEL BY RT-PCR (RSV, FLU A&B, COVID)  RVPGX2
Influenza A by PCR: NEGATIVE
Influenza B by PCR: NEGATIVE
Resp Syncytial Virus by PCR: NEGATIVE
SARS Coronavirus 2 by RT PCR: NEGATIVE

## 2022-07-14 LAB — TROPONIN I (HIGH SENSITIVITY)
Troponin I (High Sensitivity): 5 ng/L (ref <18)
Troponin I (High Sensitivity): 6 ng/L (ref <18)

## 2022-07-14 LAB — CBG MONITORING, ED: Glucose-Capillary: 173 mg/dL — ABNORMAL HIGH (ref 70–99)

## 2022-07-14 MED ORDER — SODIUM CHLORIDE 0.9 % IV BOLUS
1000.0000 mL | Freq: Once | INTRAVENOUS | Status: AC
  Administered 2022-07-14: 1000 mL via INTRAVENOUS

## 2022-07-14 NOTE — ED Triage Notes (Signed)
Reports she was getting out of bed to head to kitchen and next thing she remembers was waking up on the floor.  Complains of pain in back of head.  No thinners.

## 2022-07-14 NOTE — ED Notes (Signed)
Pt to ED via [facility] from home. Pt had unwitnessed fall at home with a possible syncope episode. Pt has a hematoma to the right side of her head. Pt is on blood thinners. Pt is in NAD.

## 2022-07-14 NOTE — ED Provider Triage Note (Signed)
Emergency Medicine Provider Triage Evaluation Note  Angela Barrett , a 81 y.o. female  was evaluated in triage.  Pt complains of patient went to check her glueSyncopal episode.  Close in the patient when she passed out and hit the floor.  Family member heard her fall.  States she woke up to EMS workers around her.  Review of Systems  Positive:  Negative:   Physical Exam  BP (!) 142/77 (BP Location: Left Arm)   Pulse (!) 59   Temp 98 F (36.7 C) (Oral)   Resp 16   Ht '5\' 3"'$  (1.6 m)   Wt 62.1 kg   SpO2 96%   BMI 24.27 kg/m  Gen:   Awake, no distress   Resp:  Normal effort  MSK:   Moves extremities without difficulty  Other:    Medical Decision Making  Medically screening exam initiated at 12:26 PM.  Appropriate orders placed.  Angela Barrett was informed that the remainder of the evaluation will be completed by another provider, this initial triage assessment does not replace that evaluation, and the importance of remaining in the ED until their evaluation is complete.     Versie Starks, PA-C 07/14/22 1227

## 2022-07-14 NOTE — ED Provider Notes (Signed)
Care assumed of patient from outgoing provider.  See their note for initial history, exam and plan.  Presyncopal episode.  Initial troponin was negative.  Lab work overall reassuring.  Ambulating without any difficulties.  Given IV fluids.  Plan for delta troponin.  Second troponin reassuring.  Ambulating without any difficulties with no dizziness.  Unable to give a urine, urinated without being able to take the top off of the urine sample.  No symptoms of a urinary tract infection.  She wants to go home.  States that she will follow-up with her primary care physician to check for a UTI.  Given return precautions for any worsening symptoms.      Nathaniel Man, MD 07/14/22 [phone]

## 2022-07-14 NOTE — ED Provider Notes (Signed)
[facility] Provider Note    Event Date/Time   First MD Initiated Contact with Patient 07/14/22 1401     (approximate)  History   Chief Complaint: Loss of Consciousness  HPI  Angela Barrett is a 81 y.o. female with a past medical history of anemia, anxiety, diabetes, hypertension, presents to the emergency department after a syncopal episode.  According to the patient she had gotten up from bed felt lightheaded and fell to the ground after losing consciousness.  Patient states for the past 5 years or so she has been experiencing episodes of lightheadedness upon standing but is never passed out.  Patient states over the past week and a half she has been coughing with congestion and states she is recently experienced multiple episodes of diarrhea.  Denies any chest pain denies any abdominal pain no vomiting.  No fever.  Physical Exam   Triage Vital Signs: ED Triage Vitals  Enc Vitals Group     BP 07/14/22 1218 (!) 142/77     Pulse Rate 07/14/22 1218 (!) 59     Resp 07/14/22 1218 16     Temp 07/14/22 1218 98 F (36.7 C)     Temp Source 07/14/22 1218 Oral     SpO2 07/14/22 1218 96 %     Weight 07/14/22 1219 137 lb (62.1 kg)     Height 07/14/22 1219 '5\' 3"'$  (1.6 m)     Head Circumference --      Peak Flow --      Pain Score 07/14/22 1218 2     Pain Loc --      Pain Edu? --      Excl. in [place]? --     Most recent vital signs: Vitals:   07/14/22 1218  BP: (!) 142/77  Pulse: (!) 59  Resp: 16  Temp: 98 F (36.7 C)  SpO2: 96%    General: Awake, no distress.  CV:  Good peripheral perfusion.  Regular rate and rhythm  Resp:  Normal effort.  Equal breath sounds bilaterally.  Abd:  No distention.  Soft, nontender.  No rebound or guarding.   ED Results / Procedures / Treatments   EKG  EKG viewed and interpreted by myself shows a normal sinus rhythm at 61 bpm with a narrow QRS, normal axis, normal intervals, nonspecific ST changes without  elevation.  RADIOLOGY  I have reviewed and interpreted the CT head images.  No intracranial bleed seen on my evaluation. Radiology is read the head CT is negative. No acute finding on CT cervical spine.   MEDICATIONS ORDERED IN ED: Medications  sodium chloride 0.9 % bolus 1,000 mL (1,000 mLs Intravenous New Bag/Given 07/14/22 1421)     IMPRESSION / MDM / ASSESSMENT AND PLAN / ED COURSE  I reviewed the triage vital signs and the nursing notes.  Patient's presentation is most consistent with acute presentation with potential threat to life or bodily function.  Patient presents to the emergency department for a syncopal event.  States she got out of bed felt lightheaded and had a brief LOC.  Reassuringly patient is feeling well besides a mild headache.  CT scan of the head and C-spine are negative.  Patient's labs are overall reassuring with a normal CBC, reassuring chemistry mild renal insufficiency but unchanged from baseline.  Troponin negative.  Given the syncopal episode we will repeat a troponin and send a COVID/flu swab.  We will IV hydrate and continue to closely monitor.  Vital  signs in the emergency department are reassuring.  I believe that the patient's repeat troponin is negative and the patient continues to feel well after IV hydration she could likely be discharged home with outpatient follow-up.  Patient agreeable to plan of care and is currently symptom-free besides a slight headache  FINAL CLINICAL IMPRESSION(S) / ED DIAGNOSES   Syncope     Note:  This document was prepared using Dragon voice recognition software and may include unintentional dictation errors.   Harvest Dark, MD 07/14/22 1431

## 2022-07-17 ENCOUNTER — Telehealth: Payer: Self-pay | Admitting: Cardiovascular Disease

## 2022-07-17 NOTE — Telephone Encounter (Signed)
New Message:      Patient's daughter called. She said patient had a syncope episode Monday and passed out. She went to [facility] ER. Daughter said she was told to contact Dr Tyrell Antonio office and let Dr Fletcher Anon know what happen. Please call to advise.

## 2022-07-17 NOTE — Telephone Encounter (Signed)
Returned the call to the daughter, per the dpr. She was calling to let Dr. Fletcher Anon know that the patient has a syncopal episode and was sent to [facility]. The patient was standing in her closet when the episode occurred. She was unaware that this was happening and does not remember how it happened. The daughter stated that the patient was advised to let cardiology know in case there was further testing needed.

## 2022-07-18 NOTE — Telephone Encounter (Signed)
Previous testing for syncope was unremarkable. Monitor for now and follow in in 1-2 months

## 2022-07-18 NOTE — Telephone Encounter (Signed)
The patient and daughter have been made aware. Appointment made for 08/14/22. Advised to call back sooner if anything is needed.

## 2022-08-14 ENCOUNTER — Ambulatory Visit: Payer: Medicare Other | Admitting: Cardiovascular Disease

## 2022-08-24 ENCOUNTER — Other Ambulatory Visit: Payer: Self-pay | Admitting: Cardiovascular Disease

## 2022-08-27 DIAGNOSIS — R42 Dizziness and giddiness: Secondary | ICD-10-CM | POA: Diagnosis not present

## 2022-08-27 DIAGNOSIS — Z794 Long term (current) use of insulin: Secondary | ICD-10-CM | POA: Diagnosis not present

## 2022-08-27 DIAGNOSIS — E1129 Type 2 diabetes mellitus with other diabetic kidney complication: Secondary | ICD-10-CM | POA: Diagnosis not present

## 2022-08-27 DIAGNOSIS — N1832 Chronic kidney disease, stage 3b: Secondary | ICD-10-CM | POA: Diagnosis not present

## 2022-08-27 DIAGNOSIS — I129 Hypertensive chronic kidney disease with stage 1 through stage 4 chronic kidney disease, or unspecified chronic kidney disease: Secondary | ICD-10-CM | POA: Diagnosis not present

## 2022-09-02 ENCOUNTER — Encounter: Payer: Self-pay | Admitting: Cardiovascular Disease

## 2022-09-02 ENCOUNTER — Ambulatory Visit: Payer: Medicare Other | Attending: Cardiovascular Disease | Admitting: Cardiovascular Disease

## 2022-09-02 VITALS — BP 143/75 | HR 65 | Ht 63.0 in | Wt 137.5 lb

## 2022-09-02 DIAGNOSIS — R4020 Unspecified coma: Secondary | ICD-10-CM | POA: Diagnosis not present

## 2022-09-02 DIAGNOSIS — E785 Hyperlipidemia, unspecified: Secondary | ICD-10-CM | POA: Diagnosis not present

## 2022-09-02 DIAGNOSIS — I25708 Atherosclerosis of coronary artery bypass graft(s), unspecified, with other forms of angina pectoris: Secondary | ICD-10-CM | POA: Diagnosis not present

## 2022-09-02 DIAGNOSIS — I10 Essential (primary) hypertension: Secondary | ICD-10-CM | POA: Diagnosis not present

## 2022-09-02 DIAGNOSIS — N183 Chronic kidney disease, stage 3 unspecified: Secondary | ICD-10-CM | POA: Diagnosis not present

## 2022-09-02 NOTE — Progress Notes (Signed)
Cardiology Office Note   Date:  09/02/2022   ID:  Bryleigh, [place] [birth year], MRN [id number]  PCP:  Burnard Bunting, MD  Cardiologist:  Kathlyn Sacramento, MD   Chief Complaint  Patient presents with   Other    F/u ED syncope c/o elevated BP, dizziness taking meclizine and feels like she is going to fall back. Meds reviewed verbally with pt.       History of Present Illness: Angela Barrett is a 82 y.o. female who presents for a followup regarding coronary artery disease status post CABG and PCI.  She has known history of diabetes and hypertension. She had CABG in June, 2014 after NSTEMI.  She had recurrent angina post CABG. Cardiac cath in 05/2013 showed significant 3 vessel CAD with diffuse diabetic vessels. SVG to RPDA/OM3 was found to be occluded. OM3 got collaterals from RCA. EF was low normal. I performed Successful PCI and DES placement to proximal and mid right coronary artery. Left circumflex occlusion was left to be treated medically. Most recent nuclear stress test in January 2016 was normal.  She had recurrent syncope with no evidence of arrhythmia on outpatient monitoring. She had syncope while she was wearing the monitor and she was noted to be in normal sinus rhythm at that time.  Most recent cardiac catheterization in June 2020 showed significant underlying three-vessel coronary artery disease with patent LIMA to LAD, patent SVG to diagonal with 50% stenosis in the distal anastomosis, chronically occluded SVG to the right PDA and OM3.  The RCA stent was patent.  The only difference from prior catheterization was progression of ostial left circumflex stenosis to 60%.  She was treated medically. Most recent stress testing was in January 2022 with a [employer].  There was no evidence of ischemia with normal ejection fraction.  Echocardiogram showed normal EF with no significant valvular abnormalities.  She had an episode of loss of consciousness in December.  She was  standing and did not have any preceding symptoms.  Her daughter then heard her fall and the patient was unconscious but no other associated symptoms.  There was no incontinence but the patient was confused for a while and did not recognize her next-door neighbor.  She has no recollection of the incident at all.  Her cardiac workup in the emergency department was unremarkable with negative troponin and normal vital signs.  She denies chest pain or shortness of breath. She does not plaint of orthostatic dizziness but that has been a chronic issue.   Past Medical History:  Diagnosis Date   Anemia    "after OHS in 01/2013" (05/25/2013)   Anginal pain ([place])    Anxiety    Arthritis    "joints" (05/25/2013)   At risk for falls    Coronary artery disease    a. nl myoview ~ 2006;  b. 01/2013 s/p MI and CABG x 4 (VG->D2, VG->RPDA->OM3, LIMA->LAD;  c. 05/2013 Cath/PCI: LM nl, LAD 60-70p/m, D1 70p, LCX 195m OM2 60p, RCA 50-60p/82m3.0x33 Xience DES), VG->D2 60, VG->RPDA/OM3 100, LIMA->LAD nl w 70 dLAD, EF 50%.   Depression    Diabetes mellitus, type 2 ([place]   "dx'd 1992" (05/25/2013)   Dyspnea    DOE   Gait disturbance    uses walker some   GERD (gastroesophageal reflux disease)    Glaucoma    bilateral   Headache(784.0)    "monthly" (05/25/2013)   History of hiatal hernia    History of  pneumonia 1985   Hyperlipidemia    Hypertension    Hypothyroidism    IBS (irritable bowel syndrome)    Lymphocytosis 03/24/2012   Myocardial infarction ([place])    2014   OA (osteoarthritis)    PONV (postoperative nausea and vomiting)    Vertigo     Past Surgical History:  Procedure Laterality Date   BLADDER SUSPENSION  2005   CARDIAC CATHETERIZATION  01/2013   CARPAL TUNNEL RELEASE Bilateral 1990-1992   CATARACT EXTRACTION W/PHACO Left 08/17/2018   Procedure: CATARACT EXTRACTION PHACO AND INTRAOCULAR LENS PLACEMENT (IOC) LEFT, DIABETIC;  Surgeon: Birder Robson, MD;  Location: [facility] ORS;  Service:  Ophthalmology;  Laterality: Left;  [place] 01:01 CDE 8.51 Fluid pack lot # [id number]   CATARACT EXTRACTION W/PHACO Right 09/14/2018   Procedure: CATARACT EXTRACTION PHACO AND INTRAOCULAR LENS PLACEMENT (IOC) RIGHT, DIABETIC;  Surgeon: Birder Robson, MD;  Location: [facility] ORS;  Service: Ophthalmology;  Laterality: Right;  [place]  01:39 CDE 15.79 Fluid pack lot # [id number]   COLONOSCOPY WITH PROPOFOL N/A 11/20/2015   Procedure: COLONOSCOPY WITH PROPOFOL;  Surgeon: Lucilla Lame, MD;  Location: [facility] ENDOSCOPY;  Service: Endoscopy;  Laterality: N/A;   CORONARY ANGIOPLASTY WITH STENT PLACEMENT  05/25/2013   "1" (05/25/2013)   CORONARY ARTERY BYPASS GRAFT N/A 01/07/2013   Procedure: CORONARY ARTERY BYPASS GRAFTING (CABG);  Surgeon: Melrose Nakayama, MD;  Location: [place];  Service: Open Heart Surgery;  Laterality: N/A;   ESOPHAGOGASTRODUODENOSCOPY (EGD) WITH PROPOFOL N/A 11/20/2015   Procedure: ESOPHAGOGASTRODUODENOSCOPY (EGD) WITH PROPOFOL;  Surgeon: Lucilla Lame, MD;  Location: [facility] ENDOSCOPY;  Service: Endoscopy;  Laterality: N/A;   HAMMER TOE SURGERY Right 2009   KNEE ARTHROSCOPY Bilateral 1993-1996   LEFT HEART CATH AND CORS/GRAFTS ANGIOGRAPHY N/A 01/28/2019   Procedure: LEFT HEART CATH AND CORS/GRAFTS ANGIOGRAPHY;  Surgeon: Nelva Bush, MD;  Location: [place] CV LAB;  Service: Cardiovascular;  Laterality: N/A;   LEFT HEART CATHETERIZATION WITH CORONARY ANGIOGRAM N/A 01/05/2013   Procedure: LEFT HEART CATHETERIZATION WITH CORONARY ANGIOGRAM;  Surgeon: Wellington Hampshire, MD;  Location: [place] CATH LAB;  Service: Cardiovascular;  Laterality: N/A;   LEFT HEART CATHETERIZATION WITH CORONARY/GRAFT ANGIOGRAM N/A 05/25/2013   Procedure: LEFT HEART CATHETERIZATION WITH Beatrix Fetters;  Surgeon: Wellington Hampshire, MD;  Location: [facility] C[facility] LAB;  Service: Cardiovascular;  Laterality: N/A;   PERCUTANEOUS CORONARY STENT INTERVENTION (PCI-S)  05/25/2013   Procedure: PERCUTANEOUS CORONARY STENT INTERVENTION  (PCI-S);  Surgeon: Wellington Hampshire, MD;  Location: [facility] C[facility] LAB;  Service: Cardiovascular;;   THYROIDECTOMY  2010   VAGINAL HYSTERECTOMY  1974     Current Outpatient Medications  Medication Sig Dispense Refill   acetaminophen (TYLENOL) 650 MG CR tablet Take 650 mg by mouth at bedtime.     acyclovir (ZOVIRAX) 400 MG tablet Take 400 mg by mouth 2 (two) times daily.     amLODipine (NORVASC) 5 MG tablet TAKE 1 TABLET BY MOUTH EVERY DAY 90 tablet 1   aspirin 81 MG tablet Take 1 tablet (81 mg total) by mouth daily.     B-D INS SYRINGE 0.5CC/31GX5/16 31G X 5/16" 0.5 ML MISC daily. as directed  5   B-D ULTRAFINE III SHORT PEN 31G X 8 MM MISC daily. as directed  5   Carboxymethylcellulose Sodium 0.25 % SOLN Place 1 drop into both eyes 3 (three) times daily as needed (for dry/irritated eyes.).     Ganciclovir (ZIRGAN) 0.15 % GEL APPLY 1 A SMALL AMOUNT IN RIGHT EYE 5 TIMES A DAY UNTIL  DIRECTED     HUMALOG KWIKPEN 100 UNIT/ML KwikPen Inject 10 Units into the skin daily.     insulin aspart (NOVOLOG) 100 UNIT/ML injection Inject into the skin.     latanoprost (XALATAN) 0.005 % ophthalmic solution Place 1 drop into both eyes at bedtime.      levothyroxine (SYNTHROID, LEVOTHROID) 88 MCG tablet Take 88 mcg by mouth daily before breakfast.     LORazepam (ATIVAN) 1 MG tablet Take 2.5 mg by mouth at bedtime.      meclizine (ANTIVERT) 25 MG tablet Take 25 mg by mouth daily as needed.     metFORMIN (GLUCOPHAGE) 500 MG tablet Take 500 mg by mouth 2 (two) times daily.     nitroGLYCERIN (NITROSTAT) 0.4 MG SL tablet PLACE 1 TABLET (0.4 MG TOTAL) UNDER THE TONGUE EVERY 5 (FIVE) MINUTES AS NEEDED FOR CHEST PAIN. 25 tablet 3   ondansetron (ZOFRAN ODT) 4 MG disintegrating tablet Take 1 tablet (4 mg total) by mouth every 8 (eight) hours as needed for nausea or vomiting. 15 tablet 0   ONE TOUCH ULTRA TEST test strip USE AS DIRECTED TO TEST BLOOD SUGAR 3 TIMES A DAY DX E11.51  3   OXERVATE 0.002 % SOLN      pantoprazole  (PROTONIX) 40 MG tablet Take 40 mg by mouth daily before breakfast.     rosuvastatin (CRESTOR) 5 MG tablet TAKE 1 TABLET BY MOUTH EVERY DAY WITH BREAKFAST 90 tablet 1   sertraline (ZOLOFT) 50 MG tablet Take by mouth.     No current facility-administered medications for this visit.    Allergies:   Amitriptyline, Atorvastatin, Cortisone, Hydrocortisone, Macrodantin [nitrofurantoin], and Motrin [ibuprofen]    Social History:  The patient  reports that she has never smoked. She has never used smokeless tobacco. She reports that she does not drink alcohol and does not use drugs.   Family History:  The patient's family history includes Cancer in her mother; Diabetes in her son; Emphysema in her brother and father.    ROS:  Please see the history of present illness.   Otherwise, review of systems are positive for none.   All other systems are reviewed and negative.    PHYSICAL EXAM: VS:  BP (!) 143/75 (BP Location: Left Arm, Patient Position: Sitting, Cuff Size: Normal)   Pulse 65   Ht '5\' 3"'$  (1.6 m)   Wt 137 lb 8 oz (62.4 kg)   SpO2 95%   BMI 24.36 kg/m  , BMI Body mass index is 24.36 kg/m. GEN: Well nourished, well developed, in no acute distress  HEENT: normal  Neck: no JVD, carotid bruits, or masses Cardiac: RRR; no rubs, or gallops,no edema .  1 out of 6 systolic murmur in the aortic area. Respiratory:  clear to auscultation bilaterally, normal work of breathing GI: soft, nontender, nondistended, + BS MS: no deformity or atrophy  Skin: warm and dry, no rash Neuro:  Strength and sensation are intact Psych: euthymic mood, full affect  EKG:  EKG is not ordered today.   Recent Labs: 07/14/2022: ALT 19; BUN 18; Creatinine, Ser 1.29; Hemoglobin 12.0; Platelets 350; Potassium 4.7; Sodium 138    Lipid Panel    Component Value Date/Time   CHOL 140 05/15/2022 0944   CHOL 164 06/24/2013 1031   TRIG 154 (H) 05/15/2022 0944   HDL 43 05/15/2022 0944   HDL 49 06/24/2013 1031    CHOLHDL 3.3 05/15/2022 0944   VLDL 31 05/15/2022 0944   LDLCALC 66 05/15/2022  [place] 86 06/24/2013 1031      Wt Readings from Last 3 Encounters:  09/02/22 137 lb 8 oz (62.4 kg)  07/14/22 137 lb (62.1 kg)  05/13/22 137 lb 2 oz (62.2 kg)        ASSESSMENT AND PLAN:  1.  Coronary artery disease involving bypass graft with other forms of angina: She is doing well with no anginal symptoms.  Continue medical therapy.    2. Essential hypertension: Blood pressure is reasonably controlled on amlodipine.  3. Hyperlipidemia with intolerance to statins.   She is tolerating rosuvastatin 5 mg once daily.  Most recent lipid profile showed an LDL of 66.  4.  Loss of consciousness: The patient had recurrent episodes of loss of consciousness most recently December.  Cardiac workup for this has been negative.  There has been no evidence of arrhythmia.  The most recent episode in December was not preceded by any symptoms and she was confused after she was found down.  This raises the possibility of a seizure disorder.  I am referring her to neurology for evaluation.   Disposition:   FU with me in 6 months   Signed, Kathlyn Sacramento, MD 09/02/22 [place], [place]

## 2022-09-02 NOTE — Patient Instructions (Signed)
Medication Instructions:  No changes *If you need a refill on your cardiac medications before your next appointment, please call your pharmacy*   Lab Work: None ordered If you have labs (blood work) drawn today and your tests are completely normal, you will receive your results only by: [place] (if you have MyChart) OR A paper copy in the mail If you have any lab test that is abnormal or we need to change your treatment, we will call you to review the results.   Testing/Procedures: None ordered   Follow-Up: At [facility], you and your health needs are our priority.  As part of our continuing mission to provide you with exceptional heart care, we have created designated Provider Care Teams.  These Care Teams include your primary Cardiologist (physician) and Advanced Practice Providers (APPs -  Physician Assistants and Nurse Practitioners) who all work together to provide you with the care you need, when you need it.  We recommend signing up for the patient portal called "MyChart".  Sign up information is provided on this After Visit Summary.  MyChart is used to connect with patients for Virtual Visits (Telemedicine).  Patients are able to view lab/test results, encounter notes, upcoming appointments, etc.  Non-urgent messages can be sent to your provider as well.   To learn more about what you can do with MyChart, go to [email or web address].    Your next appointment:   6 month(s)  Provider:   You may see Dr. Fletcher Anon or one of the following Advanced Practice Providers on your designated Care Team:   Murray Hodgkins, NP Christell Faith, PA-C Cadence Kathlen Mody, PA-C Gerrie Nordmann, NP    Other Instructions A referral has been placed to Dr. Melrose Nakayama, Neurologist

## 2022-09-09 DIAGNOSIS — R42 Dizziness and giddiness: Secondary | ICD-10-CM | POA: Diagnosis not present

## 2022-09-09 DIAGNOSIS — R200 Anesthesia of skin: Secondary | ICD-10-CM | POA: Diagnosis not present

## 2022-09-09 DIAGNOSIS — R55 Syncope and collapse: Secondary | ICD-10-CM | POA: Diagnosis not present

## 2022-09-09 DIAGNOSIS — R202 Paresthesia of skin: Secondary | ICD-10-CM | POA: Diagnosis not present

## 2022-09-09 DIAGNOSIS — R2689 Other abnormalities of gait and mobility: Secondary | ICD-10-CM | POA: Diagnosis not present

## 2022-09-24 DIAGNOSIS — H02889 Meibomian gland dysfunction of unspecified eye, unspecified eyelid: Secondary | ICD-10-CM | POA: Diagnosis not present

## 2022-09-24 DIAGNOSIS — H168 Other keratitis: Secondary | ICD-10-CM | POA: Diagnosis not present

## 2022-09-24 DIAGNOSIS — H401132 Primary open-angle glaucoma, bilateral, moderate stage: Secondary | ICD-10-CM | POA: Diagnosis not present

## 2022-09-24 DIAGNOSIS — B0052 Herpesviral keratitis: Secondary | ICD-10-CM | POA: Diagnosis not present

## 2022-09-25 DIAGNOSIS — R55 Syncope and collapse: Secondary | ICD-10-CM | POA: Diagnosis not present

## 2022-09-25 DIAGNOSIS — I6523 Occlusion and stenosis of bilateral carotid arteries: Secondary | ICD-10-CM | POA: Diagnosis not present

## 2022-09-25 DIAGNOSIS — I771 Stricture of artery: Secondary | ICD-10-CM | POA: Diagnosis not present

## 2022-10-04 ENCOUNTER — Other Ambulatory Visit: Payer: Self-pay | Admitting: Cardiovascular Disease

## 2022-10-09 ENCOUNTER — Ambulatory Visit: Payer: Medicare Other | Admitting: Cardiovascular Disease

## 2022-10-30 DIAGNOSIS — R569 Unspecified convulsions: Secondary | ICD-10-CM | POA: Diagnosis not present

## 2023-01-04 ENCOUNTER — Other Ambulatory Visit: Payer: Self-pay | Admitting: Cardiovascular Disease

## 2023-01-07 DIAGNOSIS — E1129 Type 2 diabetes mellitus with other diabetic kidney complication: Secondary | ICD-10-CM | POA: Diagnosis not present

## 2023-01-07 DIAGNOSIS — N1832 Chronic kidney disease, stage 3b: Secondary | ICD-10-CM | POA: Diagnosis not present

## 2023-01-07 DIAGNOSIS — Z794 Long term (current) use of insulin: Secondary | ICD-10-CM | POA: Diagnosis not present

## 2023-01-07 DIAGNOSIS — I129 Hypertensive chronic kidney disease with stage 1 through stage 4 chronic kidney disease, or unspecified chronic kidney disease: Secondary | ICD-10-CM | POA: Diagnosis not present

## 2023-01-12 DIAGNOSIS — N3000 Acute cystitis without hematuria: Secondary | ICD-10-CM | POA: Diagnosis not present

## 2023-02-04 ENCOUNTER — Encounter: Payer: Self-pay | Admitting: *Deleted

## 2023-02-12 ENCOUNTER — Encounter: Payer: Self-pay | Admitting: Cardiovascular Disease

## 2023-02-12 ENCOUNTER — Ambulatory Visit: Payer: Medicare Other | Attending: Cardiovascular Disease | Admitting: Cardiovascular Disease

## 2023-02-12 VITALS — BP 150/60 | HR 75 | Ht 63.0 in | Wt 145.1 lb

## 2023-02-12 DIAGNOSIS — E785 Hyperlipidemia, unspecified: Secondary | ICD-10-CM | POA: Insufficient documentation

## 2023-02-12 DIAGNOSIS — I10 Essential (primary) hypertension: Secondary | ICD-10-CM

## 2023-02-12 DIAGNOSIS — I25708 Atherosclerosis of coronary artery bypass graft(s), unspecified, with other forms of angina pectoris: Secondary | ICD-10-CM | POA: Insufficient documentation

## 2023-02-12 NOTE — Progress Notes (Signed)
Cardiology Office Note   Date:  02/12/2023   ID:  Angela Barrett, DOB 1941-02-17, MRN [id number]  PCP:  Geoffry Paradise, MD  Cardiologist:  Lorine Bears, MD   Chief Complaint  Patient presents with   Follow-up    6 month f/u c/o feeling weak/tired and left hip pain that takes her breath away. Meds reviewed verbally with pt.       History of Present Illness: Angela Barrett is a 82 y.o. female who presents for a followup regarding coronary artery disease status post CABG and PCI.  She has known history of diabetes and hypertension. She had CABG in June, 2014 after NSTEMI.  She had recurrent angina post CABG. Cardiac cath in 05/2013 showed significant 3 vessel CAD with diffuse diabetic vessels. SVG to RPDA/OM3 was found to be occluded. OM3 got collaterals from RCA. EF was low normal. I performed Successful PCI and DES placement to proximal and mid right coronary artery.   She had recurrent syncope with no evidence of arrhythmia on outpatient monitoring. She had syncope while she was wearing the monitor and she was noted to be in normal sinus rhythm at that time.  Most recent cardiac catheterization in June 2020 showed significant underlying three-vessel coronary artery disease with patent LIMA to LAD, patent SVG to diagonal with 50% stenosis in the distal anastomosis, chronically occluded SVG to the right PDA and OM3.  The RCA stent was patent.  The only difference from prior catheterization was progression of ostial left circumflex stenosis to 60%.  She was treated medically. Most recent stress testing was in January 2022 with a [employer].  There was no evidence of ischemia with normal ejection fraction.  Echocardiogram showed normal EF with no significant valvular abnormalities.  She had recurrent episodes of loss of consciousness without documented arrhythmia.  She had neurologic workup that was also unremarkable. She has been the reasonably well and denies any chest pain or  worsening dyspnea.   Past Medical History:  Diagnosis Date   Anemia    "after OHS in 01/2013" (05/25/2013)   Anginal pain (HCC)    Anxiety    Arthritis    "joints" (05/25/2013)   At risk for falls    Coronary artery disease    a. nl myoview ~ 2006;  b. 01/2013 s/p MI and CABG x 4 (VG->D2, VG->RPDA->OM3, LIMA->LAD;  c. 05/2013 Cath/PCI: LM nl, LAD 60-70p/m, D1 70p, LCX 129m, OM2 60p, RCA 50-60p/53m (3.0x33 Xience DES), VG->D2 60, VG->RPDA/OM3 100, LIMA->LAD nl w 70 dLAD, EF 50%.   Depression    Diabetes mellitus, type 2 (HCC)    "dx'd 1992" (05/25/2013)   Dyspnea    DOE   Gait disturbance    uses walker some   GERD (gastroesophageal reflux disease)    Glaucoma    bilateral   Headache(784.0)    "monthly" (05/25/2013)   History of hiatal hernia    History of pneumonia 1985   Hyperlipidemia    Hypertension    Hypothyroidism    IBS (irritable bowel syndrome)    Lymphocytosis 03/24/2012   Myocardial infarction (HCC)    2014   OA (osteoarthritis)    PONV (postoperative nausea and vomiting)    Vertigo     Past Surgical History:  Procedure Laterality Date   BLADDER SUSPENSION  2005   CARDIAC CATHETERIZATION  01/2013   CARPAL TUNNEL RELEASE Bilateral 1990-1992   CATARACT EXTRACTION W/PHACO Left 08/17/2018   Procedure: CATARACT EXTRACTION PHACO AND INTRAOCULAR  LENS PLACEMENT (IOC) LEFT, DIABETIC;  Surgeon: Galen Manila, MD;  Location: [facility] ORS;  Service: Ophthalmology;  Laterality: Left;  [place] 01:01 CDE 8.51 Fluid pack lot # [id number]   CATARACT EXTRACTION W/PHACO Right 09/14/2018   Procedure: CATARACT EXTRACTION PHACO AND INTRAOCULAR LENS PLACEMENT (IOC) RIGHT, DIABETIC;  Surgeon: Galen Manila, MD;  Location: [facility] ORS;  Service: Ophthalmology;  Laterality: Right;  [place]  01:39 CDE 15.79 Fluid pack lot # [id number]   COLONOSCOPY WITH PROPOFOL N/A 11/20/2015   Procedure: COLONOSCOPY WITH PROPOFOL;  Surgeon: Midge Minium, MD;  Location: [facility] ENDOSCOPY;  Service: Endoscopy;   Laterality: N/A;   CORONARY ANGIOPLASTY WITH STENT PLACEMENT  05/25/2013   "1" (05/25/2013)   CORONARY ARTERY BYPASS GRAFT N/A 01/07/2013   Procedure: CORONARY ARTERY BYPASS GRAFTING (CABG);  Surgeon: Loreli Slot, MD;  Location: [facility] OR;  Service: Open Heart Surgery;  Laterality: N/A;   ESOPHAGOGASTRODUODENOSCOPY (EGD) WITH PROPOFOL N/A 11/20/2015   Procedure: ESOPHAGOGASTRODUODENOSCOPY (EGD) WITH PROPOFOL;  Surgeon: Midge Minium, MD;  Location: [facility] ENDOSCOPY;  Service: Endoscopy;  Laterality: N/A;   HAMMER TOE SURGERY Right 2009   KNEE ARTHROSCOPY Bilateral 1993-1996   LEFT HEART CATH AND CORS/GRAFTS ANGIOGRAPHY N/A 01/28/2019   Procedure: LEFT HEART CATH AND CORS/GRAFTS ANGIOGRAPHY;  Surgeon: Yvonne Kendall, MD;  Location: [facility] IN[facility]IVE CV LAB;  Service: Cardiovascular;  Laterality: N/A;   LEFT HEART CATHETERIZATION WITH CORONARY ANGIOGRAM N/A 01/05/2013   Procedure: LEFT HEART CATHETERIZATION WITH CORONARY ANGIOGRAM;  Surgeon: Iran Ouch, MD;  Location: [facility] CATH LAB;  Service: Cardiovascular;  Laterality: N/A;   LEFT HEART CATHETERIZATION WITH CORONARY/GRAFT ANGIOGRAM N/A 05/25/2013   Procedure: LEFT HEART CATHETERIZATION WITH Isabel Caprice;  Surgeon: Iran Ouch, MD;  Location: [facility] CATH LAB;  Service: Cardiovascular;  Laterality: N/A;   PERCUTANEOUS CORONARY STENT INTERVENTION (PCI-S)  05/25/2013   Procedure: PERCUTANEOUS CORONARY STENT INTERVENTION (PCI-S);  Surgeon: Iran Ouch, MD;  Location: [facility] CATH LAB;  Service: Cardiovascular;;   THYROIDECTOMY  2010   VAGINAL HYSTERECTOMY  1974     Current Outpatient Medications  Medication Sig Dispense Refill   acetaminophen (TYLENOL) 650 MG CR tablet Take 650 mg by mouth at bedtime.     acyclovir (ZOVIRAX) 400 MG tablet Take 400 mg by mouth 2 (two) times daily.     amLODipine (NORVASC) 5 MG tablet TAKE 1 TABLET BY MOUTH EVERY DAY 90 tablet 0   aspirin 81 MG tablet Take 1 tablet (81 mg total) by mouth daily.     B-D  INS SYRINGE 0.5CC/31GX5/16 31G X 5/16" 0.5 ML MISC daily. as directed  5   B-D ULTRAFINE III SHORT PEN 31G X 8 MM MISC daily. as directed  5   Carboxymethylcellulose Sodium 0.25 % SOLN Place 1 drop into both eyes 3 (three) times daily as needed (for dry/irritated eyes.).     Ganciclovir (ZIRGAN) 0.15 % GEL APPLY 1 A SMALL AMOUNT IN RIGHT EYE 5 TIMES A DAY UNTIL DIRECTED     insulin aspart (NOVOLOG) 100 UNIT/ML injection Inject 10 Units into the skin as directed.     latanoprost (XALATAN) 0.005 % ophthalmic solution Place 1 drop into both eyes at bedtime.      levothyroxine (SYNTHROID, LEVOTHROID) 88 MCG tablet Take 88 mcg by mouth daily before breakfast.     LORazepam (ATIVAN) 1 MG tablet Take 2.5 mg by mouth at bedtime.      meclizine (ANTIVERT) 25 MG tablet Take 25 mg by mouth daily as needed.     metFORMIN (  GLUCOPHAGE) 500 MG tablet Take 500 mg by mouth 2 (two) times daily.     nitroGLYCERIN (NITROSTAT) 0.4 MG SL tablet PLACE 1 TABLET (0.4 MG TOTAL) UNDER THE TONGUE EVERY 5 (FIVE) MINUTES AS NEEDED FOR CHEST PAIN. 25 tablet 3   ondansetron (ZOFRAN ODT) 4 MG disintegrating tablet Take 1 tablet (4 mg total) by mouth every 8 (eight) hours as needed for nausea or vomiting. 15 tablet 0   ONE TOUCH ULTRA TEST test strip USE AS DIRECTED TO TEST BLOOD SUGAR 3 TIMES A DAY DX E11.51  3   OXERVATE 0.002 % SOLN      pantoprazole (PROTONIX) 40 MG tablet Take 40 mg by mouth daily before breakfast.     rosuvastatin (CRESTOR) 5 MG tablet TAKE 1 TABLET BY MOUTH EVERY DAY WITH BREAKFAST 90 tablet 1   sertraline (ZOLOFT) 50 MG tablet Take 50 mg by mouth as needed.     HUMALOG KWIKPEN 100 UNIT/ML KwikPen Inject 10 Units into the skin daily. (Patient not taking: Reported on 02/12/2023)     No current facility-administered medications for this visit.    Allergies:   Amitriptyline, Atorvastatin, Cortisone, Hydrocortisone, Macrodantin [nitrofurantoin], and Motrin [ibuprofen]    Social History:  The patient   reports that she has never smoked. She has never used smokeless tobacco. She reports that she does not drink alcohol and does not use drugs.   Family History:  The patient's family history includes Cancer in her mother; Diabetes in her son; Emphysema in her brother and father.    ROS:  Please see the history of present illness.   Otherwise, review of systems are positive for none.   All other systems are reviewed and negative.    PHYSICAL EXAM: VS:  BP (!) 150/60 (BP Location: Left Arm, Patient Position: Sitting, Cuff Size: Normal)   Pulse 75   Ht 5\' 3"  (1.6 m)   Wt 145 lb 2 oz (65.8 kg)   SpO2 96%   BMI 25.71 kg/m  , BMI Body mass index is 25.71 kg/m. GEN: Well nourished, well developed, in no acute distress  HEENT: normal  Neck: no JVD, carotid bruits, or masses Cardiac: RRR; no rubs, or gallops,no edema .  1 /6 systolic murmur in the aortic area. Respiratory:  clear to auscultation bilaterally, normal work of breathing GI: soft, nontender, nondistended, + BS MS: no deformity or atrophy  Skin: warm and dry, no rash Neuro:  Strength and sensation are intact Psych: euthymic mood, full affect  EKG:  EKG is ordered today. Normal sinus rhythm Left anterior fascicular block When compared with ECG of 14-Jul-2022 12:23, No significant change was found   Recent Labs: 07/14/2022: ALT 19; BUN 18; Creatinine, Ser 1.29; Hemoglobin 12.0; Platelets 350; Potassium 4.7; Sodium 138    Lipid Panel    Component Value Date/Time   CHOL 140 05/15/2022 0944   CHOL 164 06/24/2013 1031   TRIG 154 (H) 05/15/2022 0944   HDL 43 05/15/2022 0944   HDL 49 06/24/2013 1031   CHOLHDL 3.3 05/15/2022 0944   VLDL 31 05/15/2022 0944   LDLCALC 66 05/15/2022 0944   LDLCALC 86 06/24/2013 1031      Wt Readings from Last 3 Encounters:  02/12/23 145 lb 2 oz (65.8 kg)  09/02/22 137 lb 8 oz (62.4 kg)  07/14/22 137 lb (62.1 kg)        ASSESSMENT AND PLAN:  1.  Coronary artery disease involving  bypass graft with other forms of angina: She  is doing well with no anginal symptoms.  Continue medical therapy.    2. Essential hypertension: Blood pressure is mildly elevated but her blood pressure is usually more controlled.  Continue amlodipine for now.  3. Hyperlipidemia with intolerance to statins.   She is tolerating rosuvastatin 5 mg once daily.  Most recent lipid profile showed an LDL of 66.  4.  Loss of consciousness: The patient had recurrent episodes of loss of consciousness most recently December.  Cardiac workup for this has been negative.  There has been no evidence of arrhythmia.  Neurology workup was also unremarkable.  No recent episodes.   Disposition:   FU with me in 6 months   Signed, Lorine Bears, MD 02/12/23 [facility] [place], [place] [phone]

## 2023-02-12 NOTE — Patient Instructions (Signed)
Medication Instructions:  No changes *If you need a refill on your cardiac medications before your next appointment, please call your pharmacy*   Lab Work: None ordered If you have labs (blood work) drawn today and your tests are completely normal, you will receive your results only by: MyChart Message (if you have MyChart) OR A paper copy in the mail If you have any lab test that is abnormal or we need to change your treatment, we will call you to review the results.   Testing/Procedures: None ordered   Follow-Up: At [facility], you and your health needs are our priority.  As part of our continuing mission to provide you with exceptional heart care, we have created designated Provider Care Teams.  These Care Teams include your primary Cardiologist (physician) and Advanced Practice Providers (APPs -  Physician Assistants and Nurse Practitioners) who all work together to provide you with the care you need, when you need it.  We recommend signing up for the patient portal called "MyChart".  Sign up information is provided on this After Visit Summary.  MyChart is used to connect with patients for Virtual Visits (Telemedicine).  Patients are able to view lab/test results, encounter notes, upcoming appointments, etc.  Non-urgent messages can be sent to your provider as well.   To learn more about what you can do with MyChart, go to [email or web address].    Your next appointment:   6 month(s)  Provider:   You may see Dr. Arida or one of the following Advanced Practice Providers on your designated Care Team:   Christopher Berge, NP Ryan Dunn, PA-C Cadence Furth, PA-C Sheri Hammock, NP    

## 2023-02-27 ENCOUNTER — Other Ambulatory Visit: Payer: Self-pay | Admitting: Cardiovascular Disease

## 2023-03-05 DIAGNOSIS — H401132 Primary open-angle glaucoma, bilateral, moderate stage: Secondary | ICD-10-CM | POA: Diagnosis not present

## 2023-03-05 DIAGNOSIS — E119 Type 2 diabetes mellitus without complications: Secondary | ICD-10-CM | POA: Diagnosis not present

## 2023-03-05 DIAGNOSIS — H168 Other keratitis: Secondary | ICD-10-CM | POA: Diagnosis not present

## 2023-03-07 ENCOUNTER — Other Ambulatory Visit: Payer: Self-pay | Admitting: Cardiovascular Disease

## 2023-05-13 DIAGNOSIS — N1832 Chronic kidney disease, stage 3b: Secondary | ICD-10-CM | POA: Diagnosis not present

## 2023-05-13 DIAGNOSIS — R42 Dizziness and giddiness: Secondary | ICD-10-CM | POA: Diagnosis not present

## 2023-05-13 DIAGNOSIS — I129 Hypertensive chronic kidney disease with stage 1 through stage 4 chronic kidney disease, or unspecified chronic kidney disease: Secondary | ICD-10-CM | POA: Diagnosis not present

## 2023-05-13 DIAGNOSIS — I2510 Atherosclerotic heart disease of native coronary artery without angina pectoris: Secondary | ICD-10-CM | POA: Diagnosis not present

## 2023-05-13 DIAGNOSIS — I700 Atherosclerosis of aorta: Secondary | ICD-10-CM | POA: Diagnosis not present

## 2023-05-13 DIAGNOSIS — M1990 Unspecified osteoarthritis, unspecified site: Secondary | ICD-10-CM | POA: Diagnosis not present

## 2023-05-13 DIAGNOSIS — Z794 Long term (current) use of insulin: Secondary | ICD-10-CM | POA: Diagnosis not present

## 2023-05-13 DIAGNOSIS — E1129 Type 2 diabetes mellitus with other diabetic kidney complication: Secondary | ICD-10-CM | POA: Diagnosis not present

## 2023-05-13 DIAGNOSIS — Z23 Encounter for immunization: Secondary | ICD-10-CM | POA: Diagnosis not present

## 2023-08-14 ENCOUNTER — Ambulatory Visit: Payer: Medicare Other | Attending: Cardiovascular Disease | Admitting: Cardiovascular Disease

## 2023-08-14 ENCOUNTER — Encounter: Payer: Self-pay | Admitting: Cardiovascular Disease

## 2023-08-14 VITALS — BP 138/60 | HR 75 | Ht 63.0 in | Wt 149.5 lb

## 2023-08-14 DIAGNOSIS — E785 Hyperlipidemia, unspecified: Secondary | ICD-10-CM | POA: Insufficient documentation

## 2023-08-14 DIAGNOSIS — I25118 Atherosclerotic heart disease of native coronary artery with other forms of angina pectoris: Secondary | ICD-10-CM | POA: Diagnosis not present

## 2023-08-14 DIAGNOSIS — I10 Essential (primary) hypertension: Secondary | ICD-10-CM | POA: Diagnosis not present

## 2023-08-14 DIAGNOSIS — R0602 Shortness of breath: Secondary | ICD-10-CM | POA: Insufficient documentation

## 2023-08-14 NOTE — Patient Instructions (Signed)
 Medication Instructions:  No changes *If you need a refill on your cardiac medications before your next appointment, please call your pharmacy*   Lab Work: Your provider would like for you to have the following labs today: CBC, CMET, Lipid  If you have labs (blood work) drawn today and your tests are completely normal, you will receive your results only by: MyChart Message (if you have MyChart) OR A paper copy in the mail If you have any lab test that is abnormal or we need to change your treatment, we will call you to review the results.   Testing/Procedures: Your physician has requested that you have an echocardiogram. Echocardiography is a painless test that uses sound waves to create images of your heart. It provides your doctor with information about the size and shape of your heart and how well your heart's chambers and valves are working.   You may receive an ultrasound enhancing agent through an IV if needed to better visualize your heart during the echo. This procedure takes approximately one hour.  There are no restrictions for this procedure.  This will take place at [address] [facility] [address], [place] [address]  Please note: We ask at that you not bring children with you during ultrasound (echo/ vascular) testing. Due to room size and safety concerns, children are not allowed in the ultrasound rooms during exams. Our front office staff cannot provide observation of children in our lobby area while testing is being conducted. An adult accompanying a patient to their appointment will only be allowed in the ultrasound room at the discretion of the ultrasound technician under special circumstances. We apologize for any inconvenience.    Follow-Up: At [facility], you and your health needs are our priority.  As part of our continuing mission to provide you with exceptional heart care, we have created designated Provider Care Teams.  These Care Teams  include your primary Cardiologist (physician) and Advanced Practice Providers (APPs -  Physician Assistants and Nurse Practitioners) who all work together to provide you with the care you need, when you need it.  We recommend signing up for the patient portal called MyChart.  Sign up information is provided on this After Visit Summary.  MyChart is used to connect with patients for Virtual Visits (Telemedicine).  Patients are able to view lab/test results, encounter notes, upcoming appointments, etc.  Non-urgent messages can be sent to your provider as well.   To learn more about what you can do with MyChart, go to [email or web address].    Your next appointment:   6 month(s)  Provider:   You may see Dr. Darron or one of the following Advanced Practice Providers on your designated Care Team:   Lonni Meager, NP Bernardino Bring, PA-C Cadence Franchester, PA-C Tylene Lunch, NP Barnie Hila, NP

## 2023-08-14 NOTE — Progress Notes (Signed)
 Cardiology Office Note   Date:  08/14/2023   ID:  Angela, Barrett 1940/08/08, MRN [id number]  PCP:  Shepard Ade, MD  Cardiologist:  Deatrice Cage, MD   Chief Complaint  Patient presents with   Follow-up    6 month f/u no complaints today. Meds reviewed verbally with pt.       History of Present Illness: Angela Barrett is a 82 y.o. female who presents for a followup regarding coronary artery disease status post CABG and PCI.  She has known history of diabetes and hypertension. She had CABG in June, 2014 after NSTEMI.  She had recurrent angina post CABG. Cardiac cath in 05/2013 showed significant 3 vessel CAD with diffuse diabetic vessels. SVG to RPDA/OM3 was found to be occluded. OM3 got collaterals from RCA. EF was low normal. I performed PCI and DES placement to proximal and mid right coronary artery.   She had recurrent syncope with no evidence of arrhythmia on outpatient monitoring. She had syncope while she was wearing the monitor and she was noted to be in normal sinus rhythm at that time.  Most recent cardiac catheterization in June 2020 showed patent LIMA to LAD, patent SVG to diagonal with 50% stenosis in the distal anastomosis, chronically occluded SVG to the right PDA and OM3.  The RCA stent was patent.  The only difference from prior catheterization was progression of ostial left circumflex stenosis to 60%.  She was treated medically. Most recent stress testing was in January 2022 with a Lexiscan  Myoview .  There was no evidence of ischemia with normal ejection fraction.  Echocardiogram showed normal EF with no significant valvular abnormalities.  She had recurrent episodes of loss of consciousness without documented arrhythmia.  She had neurologic workup that was also unremarkable.  She reports intermittent atypical chest pain.  She required nitroglycerin  1 time in the last 6 months.  She does report worsening exertional dyspnea and fatigue.   Past Medical  History:  Diagnosis Date   Anemia    after OHS in 01/2013 (05/25/2013)   Anginal pain (HCC)    Anxiety    Arthritis    joints (05/25/2013)   At risk for falls    Coronary artery disease    a. nl myoview  ~ 2006;  b. 01/2013 s/p MI and CABG x 4 (VG->D2, VG->RPDA->OM3, LIMA->LAD;  c. 05/2013 Cath/PCI: LM nl, LAD 60-70p/m, D1 70p, LCX 121m, OM2 60p, RCA 50-60p/63m (3.0x33 Xience DES), VG->D2 60, VG->RPDA/OM3 100, LIMA->LAD nl w 70 dLAD, EF 50%.   Depression    Diabetes mellitus, type 2 (HCC)    dx'd 1992 (05/25/2013)   Dyspnea    DOE   Gait disturbance    uses walker some   GERD (gastroesophageal reflux disease)    Glaucoma    bilateral   Headache(784.0)    monthly (05/25/2013)   History of hiatal hernia    History of pneumonia 1985   Hyperlipidemia    Hypertension    Hypothyroidism    IBS (irritable bowel syndrome)    Lymphocytosis 03/24/2012   Myocardial infarction (HCC)    2014   OA (osteoarthritis)    PONV (postoperative nausea and vomiting)    Vertigo     Past Surgical History:  Procedure Laterality Date   BLADDER SUSPENSION  2005   CARDIAC CATHETERIZATION  01/2013   CARPAL TUNNEL RELEASE Bilateral 1990-1992   CATARACT EXTRACTION W/PHACO Left 08/17/2018   Procedure: CATARACT EXTRACTION PHACO AND INTRAOCULAR LENS PLACEMENT (IOC) LEFT,  DIABETIC;  Surgeon: Jaye Fallow, MD;  Location: [facility] ORS;  Service: Ophthalmology;  Laterality: Left;  US  01:01 CDE 8.51 Fluid pack lot # [id number]   CATARACT EXTRACTION W/PHACO Right 09/14/2018   Procedure: CATARACT EXTRACTION PHACO AND INTRAOCULAR LENS PLACEMENT (IOC) RIGHT, DIABETIC;  Surgeon: Jaye Fallow, MD;  Location: [facility] OR[facility];  Service: Ophthalmology;  Laterality: Right;  US   01:39 CDE 15.79 Fluid pack lot # [id number]   COLONOSCOPY WITH PROPOFOL  N/A 11/20/2015   Procedure: COLONOSCOPY WITH PROPOFOL ;  Surgeon: Rogelia Copping, MD;  Location: [facility];  Service: Endoscopy;  Laterality: N/A;   CORONARY ANGIOPLASTY  WITH STENT PLACEMENT  05/25/2013   1 (05/25/2013)   CORONARY ARTERY BYPASS GRAFT N/A 01/07/2013   Procedure: CORONARY ARTERY BYPASS GRAFTING (CABG);  Surgeon: Elspeth JAYSON Millers, MD;  Location: [facility] OR;  Service: Open Heart Surgery;  Laterality: N/A;   ESOPHAGOGASTRODUODENOSCOPY (EGD) WITH PROPOFOL  N/A 11/20/2015   Procedure: ESOPHAGOGASTRODUODENOSCOPY (EGD) WITH PROPOFOL ;  Surgeon: Rogelia Copping, MD;  Location: [facility] ENDOS[facility];  Service: Endoscopy;  Laterality: N/A;   HAMMER TOE SURGERY Right 2009   KNEE ARTHROSCOPY Bilateral 1993-1996   LEFT HEART CATH AND CORS/GRAFTS ANGIOGRAPHY N/A 01/28/2019   Procedure: LEFT HEART CATH AND CORS/GRAFTS ANGIOGRAPHY;  Surgeon: Mady Bruckner, MD;  Location: [facility];  Service: Cardiovascular;  Laterality: N/A;   LEFT HEART CATHETERIZATION WITH CORONARY ANGIOGRAM N/A 01/05/2013   Procedure: LEFT HEART CATHETERIZATION WITH CORONARY ANGIOGRAM;  Surgeon: Deatrice DELENA Cage, MD;  Location: [facility] C[facility];  Service: Cardiovascular;  Laterality: N/A;   LEFT HEART CATHETERIZATION WITH CORONARY/GRAFT ANGIOGRAM N/A 05/25/2013   Procedure: LEFT HEART CATHETERIZATION WITH EL BILE;  Surgeon: Deatrice DELENA Cage, MD;  Location: [facility] CATH LAB;  Service: Cardiovascular;  Laterality: N/A;   PERCUTANEOUS CORONARY STENT INTERVENTION (PCI-S)  05/25/2013   Procedure: PERCUTANEOUS CORONARY STENT INTERVENTION (PCI-S);  Surgeon: Deatrice DELENA Cage, MD;  Location: [facility] CATH LAB;  Service: Cardiovascular;;   THYROIDECTOMY  2010   VAGINAL HYSTERECTOMY  1974     Current Outpatient Medications  Medication Sig Dispense Refill   acetaminophen  (TYLENOL ) 650 MG CR tablet Take 650 mg by mouth at bedtime.     acyclovir (ZOVIRAX) 400 MG tablet Take 400 mg by mouth 2 (two) times daily.     amLODipine  (NORVASC ) 5 MG tablet TAKE 1 TABLET BY MOUTH EVERY DAY 90 tablet 2   aspirin  81 MG tablet Take 1 tablet (81 mg total) by mouth daily.     B-D INS SYRINGE 0.5CC/31GX5/16 31G X 5/16  0.5 ML MISC daily. as directed  5   B-D ULTRAFINE III SHORT PEN 31G X 8 MM MISC daily. as directed  5   Carboxymethylcellulose Sodium 0.25 % SOLN Place 1 drop into both eyes 3 (three) times daily as needed (for dry/irritated eyes.).     Ganciclovir (ZIRGAN) 0.15 % GEL APPLY 1 A SMALL AMOUNT IN RIGHT EYE 5 TIMES A DAY UNTIL DIRECTED     HUMALOG KWIKPEN 100 UNIT/ML KwikPen Inject 10 Units into the skin daily.     insulin  aspart (NOVOLOG ) 100 UNIT/ML injection Inject 10 Units into the skin as directed.     latanoprost  (XALATAN ) 0.005 % ophthalmic solution Place 1 drop into both eyes at bedtime.      levothyroxine  (SYNTHROID , LEVOTHROID) 88 MCG tablet Take 88 mcg by mouth daily before breakfast.     LORazepam  (ATIVAN ) 1 MG tablet Take 2.5 mg by mouth at bedtime.      meclizine  (ANTIVERT ) 25 MG tablet Take  25 mg by mouth daily as needed.     metFORMIN  (GLUCOPHAGE ) 500 MG tablet Take 500 mg by mouth 2 (two) times daily.     nitroGLYCERIN  (NITROSTAT ) 0.4 MG SL tablet PLACE 1 TABLET (0.4 MG TOTAL) UNDER THE TONGUE EVERY 5 (FIVE) MINUTES AS NEEDED FOR CHEST PAIN. 25 tablet 3   ondansetron  (ZOFRAN  ODT) 4 MG disintegrating tablet Take 1 tablet (4 mg total) by mouth every 8 (eight) hours as needed for nausea or vomiting. 15 tablet 0   ONE TOUCH ULTRA TEST test strip USE AS DIRECTED TO TEST BLOOD SUGAR 3 TIMES A DAY DX E11.51  3   OXERVATE 0.002 % SOLN      rosuvastatin  (CRESTOR ) 5 MG tablet TAKE 1 TABLET BY MOUTH EVERY DAY WITH BREAKFAST 90 tablet 2   sertraline  (ZOLOFT ) 50 MG tablet Take 50 mg by mouth as needed.     pantoprazole  (PROTONIX ) 40 MG tablet Take 40 mg by mouth daily before breakfast. (Patient not taking: Reported on 08/14/2023)     No current facility-administered medications for this visit.    Allergies:   Amitriptyline, Atorvastatin , Cortisone, Hydrocortisone, Macrodantin [nitrofurantoin], and Motrin [ibuprofen]    Social History:  The patient  reports that she has never smoked. She has  never used smokeless tobacco. She reports that she does not drink alcohol  and does not use drugs.   Family History:  The patient's family history includes Cancer in her mother; Diabetes in her son; Emphysema in her brother and father.    ROS:  Please see the history of present illness.   Otherwise, review of systems are positive for none.   All other systems are reviewed and negative.    PHYSICAL EXAM: VS:  BP 138/60 (BP Location: Left Arm, Patient Position: Sitting, Cuff Size: Normal)   Pulse 75   Ht 5' 3 (1.6 m)   Wt 149 lb 8 oz (67.8 kg)   SpO2 96%   BMI 26.48 kg/m  , BMI Body mass index is 26.48 kg/m. GEN: Well nourished, well developed, in no acute distress  HEENT: normal  Neck: no JVD, carotid bruits, or masses Cardiac: RRR; no rubs, or gallops,no edema .  1 /6 systolic murmur in the aortic area. Respiratory:  clear to auscultation bilaterally, normal work of breathing GI: soft, nontender, nondistended, + BS MS: no deformity or atrophy  Skin: warm and dry, no rash Neuro:  Strength and sensation are intact Psych: euthymic mood, full affect  EKG:  EKG is ordered today. Normal sinus rhythm Left anterior fascicular block When compared with ECG of 12-Feb-2023 14:27, No significant change was found   Recent Labs: No results found for requested labs within last 365 days.    Lipid Panel    Component Value Date/Time   CHOL 140 05/15/2022 0944   CHOL 164 06/24/2013 1031   TRIG 154 (H) 05/15/2022 0944   HDL 43 05/15/2022 0944   HDL 49 06/24/2013 1031   CHOLHDL 3.3 05/15/2022 0944   VLDL 31 05/15/2022 0944   LDLCALC 66 05/15/2022 0944   LDLCALC 86 06/24/2013 1031      Wt Readings from Last 3 Encounters:  08/14/23 149 lb 8 oz (67.8 kg)  02/12/23 145 lb 2 oz (65.8 kg)  09/02/22 137 lb 8 oz (62.4 kg)        ASSESSMENT AND PLAN:  1.  Coronary artery disease involving bypass graft with other forms of angina: Occasional episodes of chest pain that usually responds  to nitroglycerin .  No significant worsening. She does report worsening fatigue and exertional dyspnea and thus I requested an echocardiogram.  2. Essential hypertension: Blood pressure is reasonably controlled on amlodipine .  3. Hyperlipidemia with intolerance to statins.   She is tolerating rosuvastatin  5 mg once daily.  I requested lipid and liver profile.  4.  Loss of consciousness: The patient had recurrent episodes of loss of consciousness most recently December.  Cardiac workup for this has been negative.  There has been no evidence of arrhythmia.  Neurology workup was also unremarkable.     Disposition:   FU with me in 6 months   Signed, Deatrice Cage, MD 08/14/23 [facility] [place], [place] [phone]

## 2023-08-15 LAB — COMPREHENSIVE METABOLIC PANEL
ALT: 14 [IU]/L (ref 0–32)
AST: 14 [IU]/L (ref 0–40)
Albumin: 4.6 g/dL (ref 3.7–4.7)
Alkaline Phosphatase: 75 [IU]/L (ref 44–121)
BUN/Creatinine Ratio: 10 — ABNORMAL LOW (ref 12–28)
BUN: 15 mg/dL (ref 8–27)
Bilirubin Total: 0.2 mg/dL (ref 0.0–1.2)
CO2: 19 mmol/L — ABNORMAL LOW (ref 20–29)
Calcium: 9.3 mg/dL (ref 8.7–10.3)
Chloride: 100 mmol/L (ref 96–106)
Creatinine, Ser: 1.46 mg/dL — ABNORMAL HIGH (ref 0.57–1.00)
Globulin, Total: 3 g/dL (ref 1.5–4.5)
Glucose: 224 mg/dL — ABNORMAL HIGH (ref 70–99)
Potassium: 4.9 mmol/L (ref 3.5–5.2)
Sodium: 137 mmol/L (ref 134–144)
Total Protein: 7.6 g/dL (ref 6.0–8.5)
eGFR: 36 mL/min/{1.73_m2} — ABNORMAL LOW (ref >59)

## 2023-08-15 LAB — LIPID PANEL
Chol/HDL Ratio: 3.3 {ratio} (ref 0.0–4.4)
Cholesterol, Total: 138 mg/dL (ref 100–199)
HDL: 42 mg/dL (ref >39)
LDL Chol Calc (NIH): 68 mg/dL (ref 0–99)
Triglycerides: 165 mg/dL — ABNORMAL HIGH (ref 0–149)
VLDL Cholesterol Cal: 28 mg/dL (ref 5–40)

## 2023-08-15 LAB — CBC
Hematocrit: 36.9 % (ref 34.0–46.6)
Hemoglobin: 11.9 g/dL (ref 11.1–15.9)
MCH: 31.1 pg (ref 26.6–33.0)
MCHC: 32.2 g/dL (ref 31.5–35.7)
MCV: 96 fL (ref 79–97)
Platelets: 359 10*3/uL (ref 150–450)
RBC: 3.83 x10E6/uL (ref 3.77–5.28)
RDW: 14 % (ref 11.7–15.4)
WBC: 7.3 10*3/uL (ref 3.4–10.8)

## 2023-08-17 DIAGNOSIS — H401132 Primary open-angle glaucoma, bilateral, moderate stage: Secondary | ICD-10-CM | POA: Diagnosis not present

## 2023-08-17 DIAGNOSIS — Z961 Presence of intraocular lens: Secondary | ICD-10-CM | POA: Diagnosis not present

## 2023-08-17 DIAGNOSIS — H168 Other keratitis: Secondary | ICD-10-CM | POA: Diagnosis not present

## 2023-08-17 DIAGNOSIS — M3501 Sicca syndrome with keratoconjunctivitis: Secondary | ICD-10-CM | POA: Diagnosis not present

## 2023-08-31 ENCOUNTER — Ambulatory Visit: Payer: Medicare Other | Attending: Cardiovascular Disease

## 2023-08-31 DIAGNOSIS — R0602 Shortness of breath: Secondary | ICD-10-CM | POA: Diagnosis not present

## 2023-08-31 LAB — ECHOCARDIOGRAM COMPLETE
AV Mean grad: 2 mm[Hg]
AV Peak grad: 4.1 mm[Hg]
Ao pk vel: 1.01 m/s
Area-P 1/2: 2.66 cm2
S' Lateral: 2.8 cm

## 2023-09-21 DIAGNOSIS — Z794 Long term (current) use of insulin: Secondary | ICD-10-CM | POA: Diagnosis not present

## 2023-09-21 DIAGNOSIS — I2510 Atherosclerotic heart disease of native coronary artery without angina pectoris: Secondary | ICD-10-CM | POA: Diagnosis not present

## 2023-09-21 DIAGNOSIS — I129 Hypertensive chronic kidney disease with stage 1 through stage 4 chronic kidney disease, or unspecified chronic kidney disease: Secondary | ICD-10-CM | POA: Diagnosis not present

## 2023-09-21 DIAGNOSIS — E1151 Type 2 diabetes mellitus with diabetic peripheral angiopathy without gangrene: Secondary | ICD-10-CM | POA: Diagnosis not present

## 2023-09-21 DIAGNOSIS — E1129 Type 2 diabetes mellitus with other diabetic kidney complication: Secondary | ICD-10-CM | POA: Diagnosis not present

## 2023-09-21 DIAGNOSIS — I700 Atherosclerosis of aorta: Secondary | ICD-10-CM | POA: Diagnosis not present

## 2023-09-21 DIAGNOSIS — N1832 Chronic kidney disease, stage 3b: Secondary | ICD-10-CM | POA: Diagnosis not present

## 2023-09-21 DIAGNOSIS — R2689 Other abnormalities of gait and mobility: Secondary | ICD-10-CM | POA: Diagnosis not present

## 2023-10-21 DIAGNOSIS — H401132 Primary open-angle glaucoma, bilateral, moderate stage: Secondary | ICD-10-CM | POA: Diagnosis not present

## 2023-10-21 DIAGNOSIS — B0052 Herpesviral keratitis: Secondary | ICD-10-CM | POA: Diagnosis not present

## 2023-10-21 DIAGNOSIS — H168 Other keratitis: Secondary | ICD-10-CM | POA: Diagnosis not present

## 2023-11-26 ENCOUNTER — Other Ambulatory Visit: Payer: Self-pay | Admitting: Cardiovascular Disease

## 2024-01-08 DIAGNOSIS — R109 Unspecified abdominal pain: Secondary | ICD-10-CM | POA: Diagnosis not present

## 2024-01-08 DIAGNOSIS — N3000 Acute cystitis without hematuria: Secondary | ICD-10-CM | POA: Diagnosis not present

## 2024-01-13 DIAGNOSIS — N3000 Acute cystitis without hematuria: Secondary | ICD-10-CM | POA: Diagnosis not present

## 2024-01-13 DIAGNOSIS — N3946 Mixed incontinence: Secondary | ICD-10-CM | POA: Diagnosis not present

## 2024-01-20 DIAGNOSIS — H401132 Primary open-angle glaucoma, bilateral, moderate stage: Secondary | ICD-10-CM | POA: Diagnosis not present

## 2024-01-20 DIAGNOSIS — H168 Other keratitis: Secondary | ICD-10-CM | POA: Diagnosis not present

## 2024-01-20 DIAGNOSIS — E119 Type 2 diabetes mellitus without complications: Secondary | ICD-10-CM | POA: Diagnosis not present

## 2024-03-02 ENCOUNTER — Encounter: Payer: Self-pay | Admitting: Cardiovascular Disease

## 2024-03-02 ENCOUNTER — Ambulatory Visit: Attending: Cardiovascular Disease | Admitting: Cardiovascular Disease

## 2024-03-02 VITALS — BP 140/60 | HR 78 | Ht 64.0 in | Wt 145.1 lb

## 2024-03-02 DIAGNOSIS — E042 Nontoxic multinodular goiter: Secondary | ICD-10-CM | POA: Diagnosis not present

## 2024-03-02 DIAGNOSIS — I2510 Atherosclerotic heart disease of native coronary artery without angina pectoris: Secondary | ICD-10-CM | POA: Diagnosis not present

## 2024-03-02 DIAGNOSIS — I25708 Atherosclerosis of coronary artery bypass graft(s), unspecified, with other forms of angina pectoris: Secondary | ICD-10-CM | POA: Diagnosis not present

## 2024-03-02 DIAGNOSIS — I10 Essential (primary) hypertension: Secondary | ICD-10-CM | POA: Diagnosis not present

## 2024-03-02 DIAGNOSIS — N1832 Chronic kidney disease, stage 3b: Secondary | ICD-10-CM | POA: Diagnosis not present

## 2024-03-02 DIAGNOSIS — I129 Hypertensive chronic kidney disease with stage 1 through stage 4 chronic kidney disease, or unspecified chronic kidney disease: Secondary | ICD-10-CM | POA: Diagnosis not present

## 2024-03-02 DIAGNOSIS — R42 Dizziness and giddiness: Secondary | ICD-10-CM | POA: Insufficient documentation

## 2024-03-02 DIAGNOSIS — E785 Hyperlipidemia, unspecified: Secondary | ICD-10-CM | POA: Diagnosis not present

## 2024-03-02 DIAGNOSIS — E1129 Type 2 diabetes mellitus with other diabetic kidney complication: Secondary | ICD-10-CM | POA: Diagnosis not present

## 2024-03-02 DIAGNOSIS — K219 Gastro-esophageal reflux disease without esophagitis: Secondary | ICD-10-CM | POA: Diagnosis not present

## 2024-03-02 DIAGNOSIS — E1151 Type 2 diabetes mellitus with diabetic peripheral angiopathy without gangrene: Secondary | ICD-10-CM | POA: Diagnosis not present

## 2024-03-02 NOTE — Patient Instructions (Signed)
 Medication Instructions:  Your physician recommends that you continue on your current medications as directed. Please refer to the Current Medication list given to you today.  *If you need a refill on your cardiac medications before your next appointment, please call your pharmacy*  Lab Work: No labs ordered today  If you have labs (blood work) drawn today and your tests are completely normal, you will receive your results only by: MyChart Message (if you have MyChart) OR A paper copy in the mail If you have any lab test that is abnormal or we need to change your treatment, we will call you to review the results.  Testing/Procedures: No test ordered today   Follow-Up: At [facility], you and your health needs are our priority.  As part of our continuing mission to provide you with exceptional heart care, our providers are all part of one team.  This team includes your primary Cardiologist (physician) and Advanced Practice Providers or APPs (Physician Assistants and Nurse Practitioners) who all work together to provide you with the care you need, when you need it.  Your next appointment:   6 month(s)  Provider:   You may see Lorine Bears, MD or one of the following Advanced Practice Providers on your designated Care Team:   Nicolasa Ducking, NP Ames Dura, PA-C Eula Listen, PA-C Cadence Woden, PA-C Charlsie Quest, NP Carlos Levering, NP

## 2024-03-02 NOTE — Progress Notes (Signed)
 Cardiology Office Note   Date:  03/02/2024   ID:  Shemeca, Lukasik [birth year], MRN [id number]  PCP:  Shepard Ade, MD  Cardiologist:  Deatrice Cage, MD   Chief Complaint  Patient presents with   6 month follow up     Patient c/o dizziness, chest pain/stabbing sensation with having to take NTG to relieve the symptoms.        History of Present Illness: Angela Barrett is a 83 y.o. female who presents for a followup regarding coronary artery disease status post CABG and PCI.  She has known history of diabetes and hypertension. She had CABG in June, 2014 after NSTEMI.  She had recurrent angina post CABG. Cardiac cath in 05/2013 showed significant 3 vessel CAD with diffuse diabetic vessels. SVG to RPDA/OM3 was found to be occluded. OM3 got collaterals from RCA. EF was low normal. I performed PCI and DES placement to proximal and mid right coronary artery.   She had recurrent syncope with no evidence of arrhythmia on outpatient monitoring. She had syncope while she was wearing the monitor and she was noted to be in normal sinus rhythm at that time.  Most recent cardiac catheterization in June 2020 showed patent LIMA to LAD, patent SVG to diagonal with 50% stenosis in the distal anastomosis, chronically occluded SVG to the right PDA and OM3.  The RCA stent was patent.  The only difference from prior catheterization was progression of ostial left circumflex stenosis to 60%.  She was treated medically. Most recent stress testing was in January 2022 with a Lexiscan  Myoview .  There was no evidence of ischemia with normal ejection fraction.  Echocardiogram showed normal EF with no significant valvular abnormalities.  She had recurrent episodes of loss of consciousness without documented arrhythmia.  She had neurologic workup that was also unremarkable.  She is known to have intermittent atypical chest pain.  She had an episode about 2 weeks ago that responded to nitroglycerin .  She continues  to complain of orthostatic dizziness but no syncope.   Past Medical History:  Diagnosis Date   Anemia    after OHS in 01/2013 (05/25/2013)   Anginal pain (HCC)    Anxiety    Arthritis    joints (05/25/2013)   At risk for falls    Coronary artery disease    a. nl myoview  ~ 2006;  b. 01/2013 s/p MI and CABG x 4 (VG->D2, VG->RPDA->OM3, LIMA->LAD;  c. 05/2013 Cath/PCI: LM nl, LAD 60-70p/m, D1 70p, LCX 144m, OM2 60p, RCA 50-60p/31m (3.0x33 Xience DES), VG->D2 60, VG->RPDA/OM3 100, LIMA->LAD nl w 70 dLAD, EF 50%.   Depression    Diabetes mellitus, type 2 (HCC)    dx'd 1992 (05/25/2013)   Dyspnea    DOE   Gait disturbance    uses walker some   GERD (gastroesophageal reflux disease)    Glaucoma    bilateral   Headache(784.0)    monthly (05/25/2013)   History of hiatal hernia    History of pneumonia 1985   Hyperlipidemia    Hypertension    Hypothyroidism    IBS (irritable bowel syndrome)    Lymphocytosis 03/24/2012   Myocardial infarction (HCC)    2014   OA (osteoarthritis)    PONV (postoperative nausea and vomiting)    Vertigo     Past Surgical History:  Procedure Laterality Date   BLADDER SUSPENSION  2005   CARDIAC CATHETERIZATION  01/2013   CARPAL TUNNEL RELEASE Bilateral 1990-1992   CATARACT  EXTRACTION W/PHACO Left 08/17/2018   Procedure: CATARACT EXTRACTION PHACO AND INTRAOCULAR LENS PLACEMENT (IOC) LEFT, DIABETIC;  Surgeon: Jaye Fallow, MD;  Location: [facility] ORS;  Service: Ophthalmology;  Laterality: Left;  US  01:01 CDE 8.51 Fluid pack lot # [id number]   CATARACT EXTRACTION W/PHACO Right 09/14/2018   Procedure: CATARACT EXTRACTION PHACO AND INTRAOCULAR LENS PLACEMENT (IOC) RIGHT, DIABETIC;  Surgeon: Jaye Fallow, MD;  Location: [facility] ORS;  Service: Ophthalmology;  Laterality: Right;  US   01:39 CDE 15.79 Fluid pack lot # [id number]   COLONOSCOPY WITH PROPOFOL  N/A 11/20/2015   Procedure: COLONOSCOPY WITH PROPOFOL ;  Surgeon: Rogelia Copping, MD;  Location: [facility]  ENDOSCOPY;  Service: Endoscopy;  Laterality: N/A;   CORONARY ANGIOPLASTY WITH STENT PLACEMENT  05/25/2013   1 (05/25/2013)   CORONARY ARTERY BYPASS GRAFT N/A 01/07/2013   Procedure: CORONARY ARTERY BYPASS GRAFTING (CABG);  Surgeon: Elspeth JAYSON Millers, MD;  Location: [facility] OR;  Service: Open Heart Surgery;  Laterality: N/A;   ESOPHAGOGASTRODUODENOSCOPY (EGD) WITH PROPOFOL  N/A 11/20/2015   Procedure: ESOPHAGOGASTRODUODENOSCOPY (EGD) WITH PROPOFOL ;  Surgeon: Rogelia Copping, MD;  Location: [facility] ENDOSCOPY;  Service: Endoscopy;  Laterality: N/A;   HAMMER TOE SURGERY Right 2009   KNEE ARTHROSCOPY Bilateral 1993-1996   LEFT HEART CATH AND CORS/GRAFTS ANGIOGRAPHY N/A 01/28/2019   Procedure: LEFT HEART CATH AND CORS/GRAFTS ANGIOGRAPHY;  Surgeon: Mady Bruckner, MD;  Location: [facility] INVASIVE CV LAB;  Service: Cardiovascular;  Laterality: N/A;   LEFT HEART CATHETERIZATION WITH CORONARY ANGIOGRAM N/A 01/05/2013   Procedure: LEFT HEART CATHETERIZATION WITH CORONARY ANGIOGRAM;  Surgeon: Deatrice DELENA Cage, MD;  Location: [facility] CATH LAB;  Service: Cardiovascular;  Laterality: N/A;   LEFT HEART CATHETERIZATION WITH CORONARY/GRAFT ANGIOGRAM N/A 05/25/2013   Procedure: LEFT HEART CATHETERIZATION WITH EL BILE;  Surgeon: Deatrice DELENA Cage, MD;  Location: [facility] CATH LAB;  Service: Cardiovascular;  Laterality: N/A;   PERCUTANEOUS CORONARY STENT INTERVENTION (PCI-S)  05/25/2013   Procedure: PERCUTANEOUS CORONARY STENT INTERVENTION (PCI-S);  Surgeon: Deatrice DELENA Cage, MD;  Location: [facility] CATH LAB;  Service: Cardiovascular;;   THYROIDECTOMY  2010   VAGINAL HYSTERECTOMY  1974     Current Outpatient Medications  Medication Sig Dispense Refill   acetaminophen  (TYLENOL ) 650 MG CR tablet Take 650 mg by mouth at bedtime.     acyclovir (ZOVIRAX) 400 MG tablet Take 400 mg by mouth 2 (two) times daily.     amLODipine  (NORVASC ) 5 MG tablet TAKE 1 TABLET BY MOUTH EVERY DAY 90 tablet 3   aspirin  81 MG tablet Take 1 tablet (81  mg total) by mouth daily.     B-D INS SYRINGE 0.5CC/31GX5/16 31G X 5/16 0.5 ML MISC daily. as directed  5   B-D ULTRAFINE III SHORT PEN 31G X 8 MM MISC daily. as directed  5   Carboxymethylcellulose Sodium 0.25 % SOLN Place 1 drop into both eyes 3 (three) times daily as needed (for dry/irritated eyes.).     Ganciclovir (ZIRGAN) 0.15 % GEL APPLY 1 A SMALL AMOUNT IN RIGHT EYE 5 TIMES A DAY UNTIL DIRECTED     HUMALOG KWIKPEN 100 UNIT/ML KwikPen Inject 10 Units into the skin daily.     insulin  aspart (NOVOLOG ) 100 UNIT/ML injection Inject 10 Units into the skin as directed.     latanoprost  (XALATAN ) 0.005 % ophthalmic solution Place 1 drop into both eyes at bedtime.      levothyroxine  (SYNTHROID , LEVOTHROID) 88 MCG tablet Take 88 mcg by mouth daily before breakfast.     LORazepam  (ATIVAN ) 1 MG tablet Take 2.5  mg by mouth at bedtime.      meclizine  (ANTIVERT ) 25 MG tablet Take 25 mg by mouth daily as needed.     metFORMIN  (GLUCOPHAGE ) 500 MG tablet Take 500 mg by mouth 2 (two) times daily.     nitroGLYCERIN  (NITROSTAT ) 0.4 MG SL tablet PLACE 1 TABLET (0.4 MG TOTAL) UNDER THE TONGUE EVERY 5 (FIVE) MINUTES AS NEEDED FOR CHEST PAIN. 25 tablet 3   ondansetron  (ZOFRAN  ODT) 4 MG disintegrating tablet Take 1 tablet (4 mg total) by mouth every 8 (eight) hours as needed for nausea or vomiting. 15 tablet 0   ONE TOUCH ULTRA TEST test strip USE AS DIRECTED TO TEST BLOOD SUGAR 3 TIMES A DAY DX E11.51  3   OXERVATE 0.002 % SOLN      rosuvastatin  (CRESTOR ) 5 MG tablet TAKE 1 TABLET BY MOUTH EVERY DAY WITH BREAKFAST 90 tablet 3   sertraline  (ZOLOFT ) 50 MG tablet Take 50 mg by mouth as needed.     No current facility-administered medications for this visit.    Allergies:   Amitriptyline, Atorvastatin , Cortisone, Hydrocortisone, Macrodantin [nitrofurantoin], and Motrin [ibuprofen]    Social History:  The patient  reports that she has never smoked. She has never used smokeless tobacco. She reports that she does  not drink alcohol  and does not use drugs.   Family History:  The patient's family history includes Cancer in her mother; Diabetes in her son; Emphysema in her brother and father.    ROS:  Please see the history of present illness.   Otherwise, review of systems are positive for none.   All other systems are reviewed and negative.    PHYSICAL EXAM: VS:  BP (!) 140/60 (BP Location: Left Arm, Patient Position: Sitting, Cuff Size: Normal)   Pulse 78   Ht 5' 4 (1.626 m)   Wt 145 lb 2 oz (65.8 kg)   SpO2 96%   BMI 24.91 kg/m  , BMI Body mass index is 24.91 kg/m. GEN: Well nourished, well developed, in no acute distress  HEENT: normal  Neck: no JVD, carotid bruits, or masses Cardiac: RRR; no rubs, or gallops,no edema .  1 /6 systolic murmur in the aortic area. Respiratory:  clear to auscultation bilaterally, normal work of breathing GI: soft, nontender, nondistended, + BS MS: no deformity or atrophy  Skin: warm and dry, no rash Neuro:  Strength and sensation are intact Psych: euthymic mood, full affect  EKG:  EKG is ordered today. Normal sinus rhythm Left anterior fascicular block When compared with ECG of 14-Aug-2023 11:04, No significant change was found   Recent Labs: 08/14/2023: ALT 14; BUN 15; Creatinine, Ser 1.46; Hemoglobin 11.9; Platelets 359; Potassium 4.9; Sodium 137    Lipid Panel    Component Value Date/Time   CHOL 138 08/14/2023 1136   TRIG 165 (H) 08/14/2023 1136   HDL 42 08/14/2023 1136   CHOLHDL 3.3 08/14/2023 1136   CHOLHDL 3.3 05/15/2022 0944   VLDL 31 05/15/2022 0944   LDLCALC 68 08/14/2023 1136      Wt Readings from Last 3 Encounters:  03/02/24 145 lb 2 oz (65.8 kg)  08/14/23 149 lb 8 oz (67.8 kg)  02/12/23 145 lb 2 oz (65.8 kg)        ASSESSMENT AND PLAN:  1.  Coronary artery disease involving bypass graft with other forms of angina: Occasional episodes of chest pain that usually responds to nitroglycerin .  No significant worsening. Most  recent echocardiogram in January showed normal LV  systolic function with mild mitral regurgitation.  2. Essential hypertension: Blood pressure is reasonably controlled on amlodipine .  3. Hyperlipidemia with intolerance to statins.   She is tolerating rosuvastatin  5 mg once daily.  Most recent lipid profile in January showed an LDL of 68.  4.  Loss of consciousness: The patient had recurrent episodes of loss of consciousness most recently December.  Cardiac workup for this has been negative.  There has been no evidence of arrhythmia.  Neurology workup was also unremarkable.  No episodes in the last 6 months.  5.  Orthostatic dizziness: She is mildly orthostatic by vital signs today but her blood pressure recovered after a few minutes.  I advised her to stand up slowly.   Disposition:   FU with me in 6 months   Signed,  Deatrice Cage, MD 03/02/24 [facility] [place], [place] [phone]

## 2024-03-09 DIAGNOSIS — E1129 Type 2 diabetes mellitus with other diabetic kidney complication: Secondary | ICD-10-CM | POA: Diagnosis not present

## 2024-03-09 DIAGNOSIS — K219 Gastro-esophageal reflux disease without esophagitis: Secondary | ICD-10-CM | POA: Diagnosis not present

## 2024-03-09 DIAGNOSIS — Z1331 Encounter for screening for depression: Secondary | ICD-10-CM | POA: Diagnosis not present

## 2024-03-09 DIAGNOSIS — N1832 Chronic kidney disease, stage 3b: Secondary | ICD-10-CM | POA: Diagnosis not present

## 2024-03-09 DIAGNOSIS — R82998 Other abnormal findings in urine: Secondary | ICD-10-CM | POA: Diagnosis not present

## 2024-03-09 DIAGNOSIS — E785 Hyperlipidemia, unspecified: Secondary | ICD-10-CM | POA: Diagnosis not present

## 2024-03-09 DIAGNOSIS — Z0000 Encounter for general adult medical examination without abnormal findings: Secondary | ICD-10-CM | POA: Diagnosis not present

## 2024-03-09 DIAGNOSIS — R2689 Other abnormalities of gait and mobility: Secondary | ICD-10-CM | POA: Diagnosis not present

## 2024-03-09 DIAGNOSIS — Z1339 Encounter for screening examination for other mental health and behavioral disorders: Secondary | ICD-10-CM | POA: Diagnosis not present

## 2024-03-09 DIAGNOSIS — I129 Hypertensive chronic kidney disease with stage 1 through stage 4 chronic kidney disease, or unspecified chronic kidney disease: Secondary | ICD-10-CM | POA: Diagnosis not present

## 2024-03-09 DIAGNOSIS — E119 Type 2 diabetes mellitus without complications: Secondary | ICD-10-CM | POA: Diagnosis not present

## 2024-03-09 DIAGNOSIS — Z794 Long term (current) use of insulin: Secondary | ICD-10-CM | POA: Diagnosis not present

## 2024-03-09 DIAGNOSIS — I2510 Atherosclerotic heart disease of native coronary artery without angina pectoris: Secondary | ICD-10-CM | POA: Diagnosis not present

## 2024-04-11 DIAGNOSIS — M3501 Sicca syndrome with keratoconjunctivitis: Secondary | ICD-10-CM | POA: Diagnosis not present

## 2024-04-11 DIAGNOSIS — H401132 Primary open-angle glaucoma, bilateral, moderate stage: Secondary | ICD-10-CM | POA: Diagnosis not present

## 2024-04-11 DIAGNOSIS — E119 Type 2 diabetes mellitus without complications: Secondary | ICD-10-CM | POA: Diagnosis not present

## 2024-04-11 DIAGNOSIS — H168 Other keratitis: Secondary | ICD-10-CM | POA: Diagnosis not present

## 2024-09-06 ENCOUNTER — Ambulatory Visit: Admitting: Cardiovascular Disease

## 2024-09-13 ENCOUNTER — Ambulatory Visit: Admitting: Cardiovascular Disease
